# Patient Record
Sex: Female | Born: 1960 | Race: Black or African American | Hispanic: No | Marital: Single | Smoking: Never smoker
Health system: Southern US, Community
[De-identification: ages and names within clinical notes are randomized; demographics above are authoritative.]

## PROBLEM LIST (undated history)

## (undated) DIAGNOSIS — T7840XA Allergy, unspecified, initial encounter: Secondary | ICD-10-CM

## (undated) DIAGNOSIS — J302 Other seasonal allergic rhinitis: Secondary | ICD-10-CM

## (undated) DIAGNOSIS — E785 Hyperlipidemia, unspecified: Secondary | ICD-10-CM

## (undated) DIAGNOSIS — M810 Age-related osteoporosis without current pathological fracture: Secondary | ICD-10-CM

## (undated) DIAGNOSIS — F419 Anxiety disorder, unspecified: Secondary | ICD-10-CM

## (undated) DIAGNOSIS — I10 Essential (primary) hypertension: Secondary | ICD-10-CM

## (undated) DIAGNOSIS — N189 Chronic kidney disease, unspecified: Secondary | ICD-10-CM

## (undated) DIAGNOSIS — G4700 Insomnia, unspecified: Secondary | ICD-10-CM

## (undated) HISTORY — DX: Anxiety disorder, unspecified: F41.9

## (undated) HISTORY — DX: Age-related osteoporosis without current pathological fracture: M81.0

## (undated) HISTORY — DX: Essential (primary) hypertension: I10

## (undated) HISTORY — DX: Other seasonal allergic rhinitis: J30.2

## (undated) HISTORY — DX: Insomnia, unspecified: G47.00

## (undated) HISTORY — DX: Allergy, unspecified, initial encounter: T78.40XA

## (undated) HISTORY — DX: Hyperlipidemia, unspecified: E78.5

## (undated) HISTORY — DX: Chronic kidney disease, unspecified: N18.9

---

## 2007-01-27 ENCOUNTER — Other Ambulatory Visit: Admission: RE | Admit: 2007-01-27 | Discharge: 2007-01-27 | Payer: Self-pay | Admitting: Obstetrics & Gynecology

## 2008-02-06 ENCOUNTER — Other Ambulatory Visit: Admission: RE | Admit: 2008-02-06 | Discharge: 2008-02-06 | Payer: Self-pay | Admitting: Obstetrics and Gynecology

## 2009-02-10 ENCOUNTER — Other Ambulatory Visit: Admission: RE | Admit: 2009-02-10 | Discharge: 2009-02-10 | Payer: Self-pay | Admitting: Obstetrics & Gynecology

## 2010-05-08 ENCOUNTER — Encounter (INDEPENDENT_AMBULATORY_CARE_PROVIDER_SITE_OTHER): Payer: Self-pay | Admitting: *Deleted

## 2010-05-08 ENCOUNTER — Encounter: Payer: Self-pay | Admitting: Internal Medicine

## 2010-05-08 ENCOUNTER — Ambulatory Visit: Payer: Self-pay | Admitting: Internal Medicine

## 2010-05-12 ENCOUNTER — Encounter: Payer: Self-pay | Admitting: Internal Medicine

## 2010-05-14 NOTE — Letter (Signed)
Summary: Plan of Care, Need to Discuss  [facility]  [address]   [place], [place] [address]   Phone: [phone]  Fax: [phone]    May 08, 2010  BLIMY NAPOLEON [address] [place], [place]  [address] 30-Jan-1961   Dear Ms. Richman,   We are writing this letter to inform you of treatment plans and/or discuss your plan of care.  We have tried several times to contact you; however, we have yet to reach you.  We ask that you please contact our office for follow-up on your gastrointestinal issues.  We can  be reached at [phone] to schedule an appointment, or to speak with someone regarding your health care needs.  Please do not neglect your health.   Sincerely,    Rexene Alberts  [facility] Ph: [phone]    Fax: [phone]

## 2010-05-14 NOTE — Letter (Signed)
Summary: TCS ORDER  TCS ORDER   Imported By: Rexene Alberts 05/08/2010 11:29:11  _____________________________________________________________________  External Attachment:    Type:   Image     Comment:   External Document  Appended Document: TCS ORDER CANT REACH PATIENT/MAILED A LETTER

## 2010-05-20 NOTE — Letter (Signed)
Summary: TCS TRIAGE  TCS TRIAGE   Imported By: Rexene Alberts 05/08/2010 15:23:09  _____________________________________________________________________  External Attachment:    Type:   Image     Comment:   External Document  Appended Document: TCS TRIAGE ok as is  Appended Document: TCS TRIAGE INSTRUCTIONS MAILED TO PATIENT

## 2010-05-26 ENCOUNTER — Other Ambulatory Visit (HOSPITAL_COMMUNITY): Payer: Self-pay | Admitting: Pulmonary Disease

## 2010-05-26 DIAGNOSIS — Z780 Asymptomatic menopausal state: Secondary | ICD-10-CM

## 2010-05-27 ENCOUNTER — Encounter: Payer: 59 | Admitting: Internal Medicine

## 2010-05-27 ENCOUNTER — Ambulatory Visit (HOSPITAL_COMMUNITY)
Admission: RE | Admit: 2010-05-27 | Discharge: 2010-05-27 | Disposition: A | Discharge status: Home / Self Care | Payer: 59 | Source: Ambulatory Visit | Attending: Internal Medicine | Admitting: Internal Medicine

## 2010-05-27 DIAGNOSIS — Z1211 Encounter for screening for malignant neoplasm of colon: Secondary | ICD-10-CM | POA: Insufficient documentation

## 2010-05-27 DIAGNOSIS — I10 Essential (primary) hypertension: Secondary | ICD-10-CM | POA: Insufficient documentation

## 2010-06-01 ENCOUNTER — Ambulatory Visit (HOSPITAL_COMMUNITY)
Admission: RE | Admit: 2010-06-01 | Discharge: 2010-06-01 | Disposition: A | Discharge status: Home / Self Care | Payer: 59 | Source: Ambulatory Visit | Attending: Pulmonary Disease | Admitting: Pulmonary Disease

## 2010-06-01 DIAGNOSIS — M899 Disorder of bone, unspecified: Secondary | ICD-10-CM | POA: Insufficient documentation

## 2010-06-01 DIAGNOSIS — M949 Disorder of cartilage, unspecified: Secondary | ICD-10-CM | POA: Insufficient documentation

## 2010-06-01 DIAGNOSIS — Z780 Asymptomatic menopausal state: Secondary | ICD-10-CM | POA: Insufficient documentation

## 2010-06-03 NOTE — Op Note (Addendum)
  [name], Carol Maldonado              ACCOUNT NO.:  [id number]  MEDICAL RECORD NO.:  [id number]           PATIENT TYPE:  O  LOCATION:  DAYP                          FACILITY:  [facility]  PHYSICIAN:  R. Roetta Sessions, M.D. DATE OF BIRTH:  [birth year]  DATE OF PROCEDURE:  05/27/2010 DATE OF DISCHARGE:                              OPERATIVE REPORT   INDICATIONS FOR PROCEDURE:  A 50 year old lady referred for her first ever screening colonoscopy.  No lower GI tract symptoms.  No family history of polyps or colon cancer.  Colonoscopy is now being done as standard screening maneuver.  Risks, benefits, limitations, alternatives and imponderables have been discussed, questions answered.  Please see the documentation in the medical record.  PROCEDURE NOTE:  O2 saturation, blood pressure, pulse and respirations were monitored throughout the entire procedure.  CONSCIOUS SEDATION: 1. Versed 4 mg IV. 2. Demerol 100 mg IV in divided doses.  INSTRUMENT:  Pentax video chip system.  Digital rectal exam revealed no abnormalities.  Endoscopic findings: Prep was excellent.  Colon:  Colonic mucosa was surveyed from the rectosigmoid junction through the left transverse right colon to the appendiceal orifice, ileocecal valve/cecum.  These structures were well seen and photographed for the record.  From this level, scope was slowly withdrawn.  All previously mentioned mucosal surfaces were again seen. The terminal ileum was also intubated 10-cm.  The colonic mucosa as well as terminal mucosa appeared normal.  Scope was pulled down into the rectum where thorough examination of the rectal mucosa including retroflexed view of the anal verge demonstrated no abnormalities.  The patient tolerated the procedure well, was reactive to endoscopy.  Cecal withdrawal time 10 minutes.  IMPRESSION:  Normal rectum:  Terminal ileum.  RECOMMENDATIONS:  Repeat screening colonoscopy in 10 years.    Jonathon Bellows,  M.D.    RMR/MEDQ  D:  05/27/2010  T:  05/27/2010  Job:  [id number]  Electronically Signed by Lorrin Goodell M.D. on 06/02/2010 02:43:05 PM Electronically Signed by Lorrin Goodell M.D. on 06/02/2010 03:18:01 PM Electronically Signed by Lorrin Goodell M.D. on 06/02/2010 03:42:45 PM Electronically Signed by Lorrin Goodell M.D. on 06/02/2010 04:19:35 PM Electronically Signed by Lorrin Goodell M.D. on 06/02/2010 04:50:41 PM Electronically Signed by Lorrin Goodell M.D. on 06/02/2010 04:50:41 PM Electronically Signed by Lorrin Goodell M.D. on 06/02/2010 07:36:58 PM

## 2011-03-27 HISTORY — PX: COLONOSCOPY: SHX174

## 2014-02-18 ENCOUNTER — Other Ambulatory Visit (HOSPITAL_COMMUNITY)
Admission: RE | Admit: 2014-02-18 | Discharge: 2014-02-18 | Disposition: A | Discharge status: Home / Self Care | Payer: 59 | Source: Ambulatory Visit | Attending: Obstetrics & Gynecology | Admitting: Obstetrics & Gynecology

## 2014-02-18 ENCOUNTER — Encounter: Payer: Self-pay | Admitting: Obstetrics & Gynecology

## 2014-02-18 ENCOUNTER — Ambulatory Visit (INDEPENDENT_AMBULATORY_CARE_PROVIDER_SITE_OTHER): Payer: 59 | Admitting: Obstetrics & Gynecology

## 2014-02-18 VITALS — BP 130/90 | Ht 61.0 in | Wt 147.0 lb

## 2014-02-18 DIAGNOSIS — Z1151 Encounter for screening for human papillomavirus (HPV): Secondary | ICD-10-CM | POA: Diagnosis present

## 2014-02-18 DIAGNOSIS — Z01419 Encounter for gynecological examination (general) (routine) without abnormal findings: Secondary | ICD-10-CM | POA: Diagnosis present

## 2014-02-18 DIAGNOSIS — Z1212 Encounter for screening for malignant neoplasm of rectum: Secondary | ICD-10-CM

## 2014-02-18 DIAGNOSIS — Z1211 Encounter for screening for malignant neoplasm of colon: Secondary | ICD-10-CM

## 2014-02-18 NOTE — Progress Notes (Signed)
Patient ID: Carol Maldonado, female   DOB: 01/01/1961, 53 y.o.   MRN: [id number] Subjective:     Carol Maldonado is a 53 y.o. female here for a routine exam.  No LMP recorded. Patient has had an implant. No obstetric history on file. Birth Control Method:  nexplanon Menstrual Calendar(currently): amenorrheic  Current complaints: none.   Current acute medical issues:  none   Recent Gynecologic History No LMP recorded. Patient has had an implant. Last Pap: 2012,  normal Last mammogram: 2015,  Normal [facility] [place]  Past Medical History  Diagnosis Date  . Seasonal allergies     History reviewed. No pertinent past surgical history.  OB History    No data available      History   Social History  . Marital Status: Divorced    Spouse Name: N/A    Number of Children: N/A  . Years of Education: N/A   Social History Main Topics  . Smoking status: Never Smoker   . Smokeless tobacco: None  . Alcohol Use: None  . Drug Use: None  . Sexual Activity: None   Other Topics Concern  . None   Social History Narrative  . None    Family History  Problem Relation Age of Onset  . Hypertension Mother     Current outpatient prescriptions: amLODipine-olmesartan (AZOR) 5-20 MG per tablet, Take 1 tablet by mouth daily., Disp: , Rfl: ;  azelastine (ASTELIN) 0.1 % nasal spray, Place into both nostrils 2 (two) times daily. Use in each nostril as directed, Disp: , Rfl: ;  desloratadine (CLARINEX) 5 MG tablet, Take 5 mg by mouth daily., Disp: , Rfl:  flunisolide (NASALIDE) 25 MCG/ACT (0.025%) SOLN, Place 2 sprays into the nose 2 (two) times daily., Disp: , Rfl:   Review of Systems  Review of Systems  Constitutional: Negative for fever, chills, weight loss, malaise/fatigue and diaphoresis.  HENT: Negative for hearing loss, ear pain, nosebleeds, congestion, sore throat, neck pain, tinnitus and ear discharge.   Eyes: Negative for blurred vision, double vision, photophobia, pain,  discharge and redness.  Respiratory: Negative for cough, hemoptysis, sputum production, shortness of breath, wheezing and stridor.   Cardiovascular: Negative for chest pain, palpitations, orthopnea, claudication, leg swelling and PND.  Gastrointestinal: negative for abdominal pain. Negative for heartburn, nausea, vomiting, diarrhea, constipation, blood in stool and melena.  Genitourinary: Negative for dysuria, urgency, frequency, hematuria and flank pain.  Musculoskeletal: Negative for myalgias, back pain, joint pain and falls.  Skin: Negative for itching and rash.  Neurological: Negative for dizziness, tingling, tremors, sensory change, speech change, focal weakness, seizures, loss of consciousness, weakness and headaches.  Endo/Heme/Allergies: Negative for environmental allergies and polydipsia. Does not bruise/bleed easily.  Psychiatric/Behavioral: Negative for depression, suicidal ideas, hallucinations, memory loss and substance abuse. The patient is not nervous/anxious and does not have insomnia.        Objective:  Blood pressure 130/90, height 5\' 1"  (1.549 m), weight 147 lb (66.679 kg).   Physical Exam  Vitals reviewed. Constitutional: She is oriented to person, place, and time. She appears well-developed and well-nourished.  HENT:  Head: Normocephalic and atraumatic.        Right Ear: External ear normal.  Left Ear: External ear normal.  Nose: Nose normal.  Mouth/Throat: Oropharynx is clear and moist.  Eyes: Conjunctivae and EOM are normal. Pupils are equal, round, and reactive to light. Right eye exhibits no discharge. Left eye exhibits no discharge. No scleral icterus.  Neck: Normal  range of motion. Neck supple. No tracheal deviation present. No thyromegaly present.  Cardiovascular: Normal rate, regular rhythm, normal heart sounds and intact distal pulses.  Exam reveals no gallop and no friction rub.   No murmur heard. Respiratory: Effort normal and breath sounds normal. No  respiratory distress. She has no wheezes. She has no rales. She exhibits no tenderness.  GI: Soft. Bowel sounds are normal. She exhibits no distension and no mass. There is no tenderness. There is no rebound and no guarding.  Genitourinary:  Breasts no masses skin changes or nipple changes bilaterally      Vulva is normal without lesions Vagina is pink moist without discharge Cervix normal in appearance and pap is done Uterus is normal size shape and contour Adnexa is negative with normal sized ovaries  Rectal    hemoccult negative, normal tone, no masses  Musculoskeletal: Normal range of motion. She exhibits no edema and no tenderness.  Neurological: She is alert and oriented to person, place, and time. She has normal reflexes. She displays normal reflexes. No cranial nerve deficit. She exhibits normal muscle tone. Coordination normal.  Skin: Skin is warm and dry. No rash noted. No erythema. No pallor.  Psychiatric: She has a normal mood and affect. Her behavior is normal. Judgment and thought content normal.       Assessment:    Healthy female exam.    Plan:    Contraception: Nexplanon. Mammogram ordered. Follow up in: 1 year.

## 2014-02-20 LAB — CYTOLOGY - PAP

## 2015-02-25 ENCOUNTER — Ambulatory Visit (INDEPENDENT_AMBULATORY_CARE_PROVIDER_SITE_OTHER): Payer: 59 | Admitting: Obstetrics & Gynecology

## 2015-02-25 ENCOUNTER — Encounter: Payer: Self-pay | Admitting: Obstetrics & Gynecology

## 2015-02-25 VITALS — BP 110/80 | HR 74 | Ht 61.0 in | Wt 147.0 lb

## 2015-02-25 DIAGNOSIS — Z01419 Encounter for gynecological examination (general) (routine) without abnormal findings: Secondary | ICD-10-CM | POA: Diagnosis not present

## 2015-02-25 DIAGNOSIS — Z1212 Encounter for screening for malignant neoplasm of rectum: Secondary | ICD-10-CM

## 2015-02-25 DIAGNOSIS — Z1211 Encounter for screening for malignant neoplasm of colon: Secondary | ICD-10-CM

## 2015-02-25 NOTE — Progress Notes (Signed)
Patient ID: Carol HazelJuanita S Hildebrandt, female   DOB: 03-15-1961, 54 y.o.   MRN: [id number] Subjective:     Carol Maldonado is a 54 y.o. female here for a routine exam.  No LMP recorded. Patient has had an implant. No obstetric history on file. Birth Control Method:  Post menopausal Menstrual Calendar(currently):   Current complaints: .   Current acute medical issues:     Recent Gynecologic History No LMP recorded. Patient has had an implant. Last Pap: 2015,  normal Last mammogram: 2016,  normal  Past Medical History  Diagnosis Date  . Seasonal allergies     History reviewed. No pertinent past surgical history.  OB History    No data available      Social History   Social History  . Marital Status: Divorced    Spouse Name: N/A  . Number of Children: N/A  . Years of Education: N/A   Social History Main Topics  . Smoking status: Never Smoker   . Smokeless tobacco: None  . Alcohol Use: None  . Drug Use: None  . Sexual Activity: Not Asked   Other Topics Concern  . None   Social History Narrative    Family History  Problem Relation Age of Onset  . Hypertension Mother      Current outpatient prescriptions:  .  amLODipine-olmesartan (AZOR) 5-20 MG per tablet, Take 1 tablet by mouth daily., Disp: , Rfl:  .  azelastine (ASTELIN) 0.1 % nasal spray, Place into both nostrils 2 (two) times daily. Use in each nostril as directed, Disp: , Rfl:  .  desloratadine (CLARINEX) 5 MG tablet, Take 5 mg by mouth daily., Disp: , Rfl:  .  flunisolide (NASALIDE) 25 MCG/ACT (0.025%) SOLN, Place 2 sprays into the nose 2 (two) times daily., Disp: , Rfl:   Review of Systems  Review of Systems  Constitutional: Negative for fever, chills, weight loss, malaise/fatigue and diaphoresis.  HENT: Negative for hearing loss, ear pain, nosebleeds, congestion, sore throat, neck pain, tinnitus and ear discharge.   Eyes: Negative for blurred vision, double vision, photophobia, pain, discharge and redness.   Respiratory: Negative for cough, hemoptysis, sputum production, shortness of breath, wheezing and stridor.   Cardiovascular: Negative for chest pain, palpitations, orthopnea, claudication, leg swelling and PND.  Gastrointestinal: negative for abdominal pain. Negative for heartburn, nausea, vomiting, diarrhea, constipation, blood in stool and melena.  Genitourinary: Negative for dysuria, urgency, frequency, hematuria and flank pain.  Musculoskeletal: Negative for myalgias, back pain, joint pain and falls.  Skin: Negative for itching and rash.  Neurological: Negative for dizziness, tingling, tremors, sensory change, speech change, focal weakness, seizures, loss of consciousness, weakness and headaches.  Endo/Heme/Allergies: Negative for environmental allergies and polydipsia. Does not bruise/bleed easily.  Psychiatric/Behavioral: Negative for depression, suicidal ideas, hallucinations, memory loss and substance abuse. The patient is not nervous/anxious and does not have insomnia.        Objective:  Blood pressure 110/80, pulse 74, height 5\' 1"  (1.549 m), weight 147 lb (66.679 kg).   Physical Exam  Vitals reviewed. Constitutional: She is oriented to person, place, and time. She appears well-developed and well-nourished.  HENT:  Head: Normocephalic and atraumatic.        Right Ear: External ear normal.  Left Ear: External ear normal.  Nose: Nose normal.  Mouth/Throat: Oropharynx is clear and moist.  Eyes: Conjunctivae and EOM are normal. Pupils are equal, round, and reactive to light. Right eye exhibits no discharge. Left eye exhibits no discharge. No  scleral icterus.  Neck: Normal range of motion. Neck supple. No tracheal deviation present. No thyromegaly present.  Cardiovascular: Normal rate, regular rhythm, normal heart sounds and intact distal pulses.  Exam reveals no gallop and no friction rub.   No murmur heard. Respiratory: Effort normal and breath sounds normal. No respiratory  distress. She has no wheezes. She has no rales. She exhibits no tenderness.  GI: Soft. Bowel sounds are normal. She exhibits no distension and no mass. There is no tenderness. There is no rebound and no guarding.  Genitourinary:  Breasts no masses skin changes or nipple changes bilaterally      Vulva is normal without lesions Vagina is pink moist without discharge Cervix normal in appearance and pap is done Uterus is normal size shape and contour Adnexa is negative with normal sized ovaries  {Rectal    hemoccult positive, normal tone, no masses  Musculoskeletal: Normal range of motion. She exhibits no edema and no tenderness.  Neurological: She is alert and oriented to person, place, and time. She has normal reflexes. She displays normal reflexes. No cranial nerve deficit. She exhibits normal muscle tone. Coordination normal.  Skin: Skin is warm and dry. No rash noted. No erythema. No pallor.  Psychiatric: She has a normal mood and affect. Her behavior is normal. Judgment and thought content normal.       Assessment:    Healthy female exam.    Plan:    Mammogram ordered. Follow up in: 1 year.   + hemoccult x 2  Cards sent home

## 2016-01-28 ENCOUNTER — Other Ambulatory Visit (HOSPITAL_COMMUNITY): Payer: Self-pay | Admitting: Pulmonary Disease

## 2016-01-28 DIAGNOSIS — Z780 Asymptomatic menopausal state: Secondary | ICD-10-CM

## 2016-02-04 ENCOUNTER — Other Ambulatory Visit (HOSPITAL_COMMUNITY): Payer: Self-pay | Admitting: Pulmonary Disease

## 2016-02-04 ENCOUNTER — Ambulatory Visit (HOSPITAL_COMMUNITY)
Admission: RE | Admit: 2016-02-04 | Discharge: 2016-02-04 | Disposition: A | Discharge status: Home / Self Care | Payer: 59 | Source: Ambulatory Visit | Attending: Pulmonary Disease | Admitting: Pulmonary Disease

## 2016-02-04 DIAGNOSIS — M7732 Calcaneal spur, left foot: Secondary | ICD-10-CM | POA: Insufficient documentation

## 2016-02-04 DIAGNOSIS — M79672 Pain in left foot: Secondary | ICD-10-CM | POA: Diagnosis not present

## 2016-02-04 DIAGNOSIS — M810 Age-related osteoporosis without current pathological fracture: Secondary | ICD-10-CM | POA: Diagnosis not present

## 2016-02-04 DIAGNOSIS — Z780 Asymptomatic menopausal state: Secondary | ICD-10-CM | POA: Diagnosis not present

## 2016-03-04 ENCOUNTER — Other Ambulatory Visit (HOSPITAL_COMMUNITY)
Admission: RE | Admit: 2016-03-04 | Discharge: 2016-03-04 | Disposition: A | Discharge status: Home / Self Care | Payer: 59 | Source: Ambulatory Visit | Attending: Obstetrics & Gynecology | Admitting: Obstetrics & Gynecology

## 2016-03-04 ENCOUNTER — Encounter: Payer: Self-pay | Admitting: Obstetrics & Gynecology

## 2016-03-04 ENCOUNTER — Ambulatory Visit (INDEPENDENT_AMBULATORY_CARE_PROVIDER_SITE_OTHER): Payer: 59 | Admitting: Obstetrics & Gynecology

## 2016-03-04 VITALS — BP 148/100 | HR 78 | Ht 61.0 in | Wt 132.0 lb

## 2016-03-04 DIAGNOSIS — Z01419 Encounter for gynecological examination (general) (routine) without abnormal findings: Secondary | ICD-10-CM

## 2016-03-04 DIAGNOSIS — Z1212 Encounter for screening for malignant neoplasm of rectum: Secondary | ICD-10-CM

## 2016-03-04 DIAGNOSIS — Z1211 Encounter for screening for malignant neoplasm of colon: Secondary | ICD-10-CM

## 2016-03-04 NOTE — Progress Notes (Signed)
Subjective:     Carol Maldonado is a 55 y.o. female here for a routine exam.  No LMP recorded. Patient has had an implant. No obstetric history on file. Birth Control Method:  amenorrhea Menstrual Calendar(currently): amenorrheic  Current complaints: none.   Current acute medical issues:  none   Recent Gynecologic History No LMP recorded. Patient has had an implant. Last Pap: 2015,  normal Last mammogram: 2016,  normal  Past Medical History:  Diagnosis Date  . Seasonal allergies     History reviewed. No pertinent surgical history.  OB History    No data available      Social History   Social History  . Marital status: Divorced    Spouse name: N/A  . Number of children: N/A  . Years of education: N/A   Social History Main Topics  . Smoking status: Never Smoker  . Smokeless tobacco: Never Used  . Alcohol use None  . Drug use: Unknown  . Sexual activity: Not Asked   Other Topics Concern  . None   Social History Narrative  . None    Family History  Problem Relation Age of Onset  . Hypertension Mother      Current Outpatient Prescriptions:  .  amLODipine-olmesartan (AZOR) 5-20 MG per tablet, Take 1 tablet by mouth daily., Disp: , Rfl:  .  azelastine (ASTELIN) 0.1 % nasal spray, Place into both nostrils 2 (two) times daily. Use in each nostril as directed, Disp: , Rfl:  .  desloratadine (CLARINEX) 5 MG tablet, Take 5 mg by mouth daily., Disp: , Rfl:  .  flunisolide (NASALIDE) 25 MCG/ACT (0.025%) SOLN, Place 2 sprays into the nose 2 (two) times daily., Disp: , Rfl:   Review of Systems  Review of Systems  Constitutional: Negative for fever, chills, weight loss, malaise/fatigue and diaphoresis.  HENT: Negative for hearing loss, ear pain, nosebleeds, congestion, sore throat, neck pain, tinnitus and ear discharge.   Eyes: Negative for blurred vision, double vision, photophobia, pain, discharge and redness.  Respiratory: Negative for cough, hemoptysis, sputum  production, shortness of breath, wheezing and stridor.   Cardiovascular: Negative for chest pain, palpitations, orthopnea, claudication, leg swelling and PND.  Gastrointestinal: negative for abdominal pain. Negative for heartburn, nausea, vomiting, diarrhea, constipation, blood in stool and melena.  Genitourinary: Negative for dysuria, urgency, frequency, hematuria and flank pain.  Musculoskeletal: Negative for myalgias, back pain, joint pain and falls.  Skin: Negative for itching and rash.  Neurological: Negative for dizziness, tingling, tremors, sensory change, speech change, focal weakness, seizures, loss of consciousness, weakness and headaches.  Endo/Heme/Allergies: Negative for environmental allergies and polydipsia. Does not bruise/bleed easily.  Psychiatric/Behavioral: Negative for depression, suicidal ideas, hallucinations, memory loss and substance abuse. The patient is not nervous/anxious and does not have insomnia.        Objective:  Blood pressure (!) 148/100, pulse 78, height 5\' 1"  (1.549 m), weight 132 lb (59.9 kg).   Physical Exam  Vitals reviewed. Constitutional: She is oriented to person, place, and time. She appears well-developed and well-nourished.  HENT:  Head: Normocephalic and atraumatic.        Right Ear: External ear normal.  Left Ear: External ear normal.  Nose: Nose normal.  Mouth/Throat: Oropharynx is clear and moist.  Eyes: Conjunctivae and EOM are normal. Pupils are equal, round, and reactive to light. Right eye exhibits no discharge. Left eye exhibits no discharge. No scleral icterus.  Neck: Normal range of motion. Neck supple. No tracheal deviation present.  No thyromegaly present.  Cardiovascular: Normal rate, regular rhythm, normal heart sounds and intact distal pulses.  Exam reveals no gallop and no friction rub.   No murmur heard. Respiratory: Effort normal and breath sounds normal. No respiratory distress. She has no wheezes. She has no rales. She  exhibits no tenderness.  GI: Soft. Bowel sounds are normal. She exhibits no distension and no mass. There is no tenderness. There is no rebound and no guarding.  Genitourinary:  Breasts no masses skin changes or nipple changes bilaterally      Vulva is normal without lesions Vagina is pink moist without discharge Cervix normal in appearance and pap is done Uterus is normal size shape and contour Adnexa is negative with normal sized ovaries  {Rectal    hemoccult negative, normal tone, no masses  Musculoskeletal: Normal range of motion. She exhibits no edema and no tenderness.  Neurological: She is alert and oriented to person, place, and time. She has normal reflexes. She displays normal reflexes. No cranial nerve deficit. She exhibits normal muscle tone. Coordination normal.  Skin: Skin is warm and dry. No rash noted. No erythema. No pallor.  Psychiatric: She has a normal mood and affect. Her behavior is normal. Judgment and thought content normal.       Medications Ordered at today's visit: No orders of the defined types were placed in this encounter.   Other orders placed at today's visit: No orders of the defined types were placed in this encounter.     Assessment:    Healthy female exam.    Plan:    Mammogram ordered. Follow up in: 2 years.     No Follow-up on file.

## 2016-03-09 LAB — CYTOLOGY - PAP: DIAGNOSIS: NEGATIVE

## 2016-03-18 ENCOUNTER — Encounter: Payer: Self-pay | Admitting: Obstetrics & Gynecology

## 2016-03-18 ENCOUNTER — Ambulatory Visit (INDEPENDENT_AMBULATORY_CARE_PROVIDER_SITE_OTHER): Payer: 59 | Admitting: Obstetrics & Gynecology

## 2016-03-18 VITALS — BP 130/80 | HR 80 | Wt 133.0 lb

## 2016-03-18 DIAGNOSIS — Z3049 Encounter for surveillance of other contraceptives: Secondary | ICD-10-CM | POA: Diagnosis not present

## 2016-03-18 DIAGNOSIS — Z3046 Encounter for surveillance of implantable subdermal contraceptive: Secondary | ICD-10-CM

## 2016-03-18 NOTE — Progress Notes (Signed)
Nexplanon removal Note:    Pt presents desiring removal of Nexplanon She has been counseled regarding various methods including OCP, nuva ring, levonorgestrel IUD, Depo Provera injections and Nexplanon.    The left arm is inspected and is appropriate for removal. The area is prepped with betadine. 3 cc 1% lidocaine is injected at the proposed injection site.  A small stab incision is made with a #11 blade. The Nexplanon device is isolated andremoved from  the previously anesthetized area. It is removed without difficulty.  There is minimal bleeding.  The stab incision is reapproximated with 3 steri strips. A wrap gauze dressing is placed which patient will leave in place for 2 days or so.

## 2016-05-03 DIAGNOSIS — Z1231 Encounter for screening mammogram for malignant neoplasm of breast: Secondary | ICD-10-CM | POA: Diagnosis not present

## 2016-05-12 DIAGNOSIS — R928 Other abnormal and inconclusive findings on diagnostic imaging of breast: Secondary | ICD-10-CM | POA: Diagnosis not present

## 2016-05-12 DIAGNOSIS — N6489 Other specified disorders of breast: Secondary | ICD-10-CM | POA: Diagnosis not present

## 2016-07-29 DIAGNOSIS — M810 Age-related osteoporosis without current pathological fracture: Secondary | ICD-10-CM | POA: Diagnosis not present

## 2016-07-29 DIAGNOSIS — I10 Essential (primary) hypertension: Secondary | ICD-10-CM | POA: Diagnosis not present

## 2016-07-29 DIAGNOSIS — J309 Allergic rhinitis, unspecified: Secondary | ICD-10-CM | POA: Diagnosis not present

## 2017-01-31 DIAGNOSIS — Z0000 Encounter for general adult medical examination without abnormal findings: Secondary | ICD-10-CM | POA: Diagnosis not present

## 2017-05-31 DIAGNOSIS — Z1231 Encounter for screening mammogram for malignant neoplasm of breast: Secondary | ICD-10-CM | POA: Diagnosis not present

## 2017-06-14 ENCOUNTER — Other Ambulatory Visit (HOSPITAL_COMMUNITY): Payer: Self-pay | Admitting: Pulmonary Disease

## 2017-06-14 ENCOUNTER — Ambulatory Visit (HOSPITAL_COMMUNITY)
Admission: RE | Admit: 2017-06-14 | Discharge: 2017-06-14 | Disposition: A | Discharge status: Home / Self Care | Payer: 59 | Source: Ambulatory Visit | Attending: Pulmonary Disease | Admitting: Pulmonary Disease

## 2017-06-14 DIAGNOSIS — M2578 Osteophyte, vertebrae: Secondary | ICD-10-CM | POA: Diagnosis not present

## 2017-06-14 DIAGNOSIS — I10 Essential (primary) hypertension: Secondary | ICD-10-CM | POA: Diagnosis not present

## 2017-06-14 DIAGNOSIS — R52 Pain, unspecified: Secondary | ICD-10-CM

## 2017-06-14 DIAGNOSIS — M545 Low back pain: Secondary | ICD-10-CM | POA: Insufficient documentation

## 2017-06-14 DIAGNOSIS — M5136 Other intervertebral disc degeneration, lumbar region: Secondary | ICD-10-CM | POA: Diagnosis not present

## 2017-06-14 DIAGNOSIS — M5432 Sciatica, left side: Secondary | ICD-10-CM | POA: Diagnosis not present

## 2017-08-03 DIAGNOSIS — I10 Essential (primary) hypertension: Secondary | ICD-10-CM | POA: Diagnosis not present

## 2017-08-03 DIAGNOSIS — M5432 Sciatica, left side: Secondary | ICD-10-CM | POA: Diagnosis not present

## 2018-02-06 DIAGNOSIS — Z0000 Encounter for general adult medical examination without abnormal findings: Secondary | ICD-10-CM | POA: Diagnosis not present

## 2018-03-08 DIAGNOSIS — H524 Presbyopia: Secondary | ICD-10-CM | POA: Diagnosis not present

## 2018-03-08 DIAGNOSIS — H5213 Myopia, bilateral: Secondary | ICD-10-CM | POA: Diagnosis not present

## 2018-03-09 ENCOUNTER — Encounter: Payer: Self-pay | Admitting: Adult Health

## 2018-03-09 ENCOUNTER — Ambulatory Visit (INDEPENDENT_AMBULATORY_CARE_PROVIDER_SITE_OTHER): Payer: 59 | Admitting: Adult Health

## 2018-03-09 VITALS — BP 140/92 | HR 84 | Ht 61.0 in | Wt 151.5 lb

## 2018-03-09 DIAGNOSIS — Z01419 Encounter for gynecological examination (general) (routine) without abnormal findings: Secondary | ICD-10-CM | POA: Diagnosis not present

## 2018-03-09 DIAGNOSIS — Z1212 Encounter for screening for malignant neoplasm of rectum: Secondary | ICD-10-CM | POA: Diagnosis not present

## 2018-03-09 DIAGNOSIS — Z1211 Encounter for screening for malignant neoplasm of colon: Secondary | ICD-10-CM

## 2018-03-09 LAB — HEMOCCULT GUIAC POC 1CARD (OFFICE): FECAL OCCULT BLD: NEGATIVE

## 2018-03-09 NOTE — Progress Notes (Signed)
Patient ID: Carol Maldonado, female   DOB: February 19, 1961, 57 y.o.   MRN: [id number] History of Present Illness: Carol Maldonado is a 57 year old black female, single, PM in for well woman gyn exam,she had a normal pap 03/04/16.She is still working at [employer]. Just saw Dr Juanetta GoslingHawkins last month and BP normal, but work is stressful.  PCP is Dr Juanetta GoslingHawkins.   Current Medications, Allergies, Past Medical History, Past Surgical History, Family History and Social History were reviewed in [facility] electronic medical record.     Review of Systems:  Patient denies any daily headaches(has one today), hearing loss, fatigue, blurred vision, shortness of breath, chest pain, abdominal pain, problems with bowel movements, urination, or intercourse(not having sex). No joint pain or mood swings.   Physical Exam:BP (!) 140/92 (BP Location: Left Arm, Cuff Size: Normal)   Pulse 84   Ht 5\' 1"  (1.549 m)   Wt 151 lb 8 oz (68.7 kg)   BMI 28.63 kg/m  General:  Well developed, well nourished, no acute distress Skin:  Warm and dry Neck:  Midline trachea, normal thyroid, good ROM, no lymphadenopathy Lungs; Clear to auscultation bilaterally Breast:  No dominant palpable mass, retraction, or nipple discharge Cardiovascular: Regular rate and rhythm Abdomen:  Soft, non tender, no hepatosplenomegaly Pelvic:  External genitalia is normal in appearance, no lesions.  The vagina is normal in appearance for age,pale with loss of moisture and rugae. Urethra has no lesions or masses. The cervix is smooth.  Uterus is felt to be normal size, shape, and contour.  No adnexal masses or tenderness noted.Bladder is non tender, no masses felt. Rectal: Good sphincter tone, no polyps, or hemorrhoids felt.  Hemoccult negative. Extremities/musculoskeletal:  No swelling or varicosities noted, no clubbing or cyanosis Psych:  No mood changes, alert and cooperative,seems happy PHQ 2 score 0. Fall risk is low. Examination chaperoned by Malachy Moodjanet Young  LPN.  Impression: 1. Encounter for well woman exam with routine gynecological exam   2. Screening for colorectal cancer       Plan: Get plant nurse to recheck BP tomorrow Pap and physical in 1 year Mammogram yearly Labs with PCP Colonoscopy per GI

## 2018-06-08 DIAGNOSIS — Z1231 Encounter for screening mammogram for malignant neoplasm of breast: Secondary | ICD-10-CM | POA: Diagnosis not present

## 2018-08-07 DIAGNOSIS — I10 Essential (primary) hypertension: Secondary | ICD-10-CM | POA: Diagnosis not present

## 2018-08-07 DIAGNOSIS — J301 Allergic rhinitis due to pollen: Secondary | ICD-10-CM | POA: Diagnosis not present

## 2018-08-07 DIAGNOSIS — K210 Gastro-esophageal reflux disease with esophagitis: Secondary | ICD-10-CM | POA: Diagnosis not present

## 2018-11-07 ENCOUNTER — Ambulatory Visit (HOSPITAL_COMMUNITY)
Admission: RE | Admit: 2018-11-07 | Discharge: 2018-11-07 | Disposition: A | Discharge status: Home / Self Care | Payer: 59 | Source: Ambulatory Visit | Attending: Pulmonary Disease | Admitting: Pulmonary Disease

## 2018-11-07 ENCOUNTER — Other Ambulatory Visit: Payer: Self-pay

## 2018-11-07 ENCOUNTER — Other Ambulatory Visit (HOSPITAL_COMMUNITY): Payer: Self-pay | Admitting: Pulmonary Disease

## 2018-11-07 DIAGNOSIS — M545 Low back pain, unspecified: Secondary | ICD-10-CM

## 2018-11-20 ENCOUNTER — Other Ambulatory Visit: Payer: Self-pay | Admitting: Pulmonary Disease

## 2018-11-20 ENCOUNTER — Other Ambulatory Visit (HOSPITAL_COMMUNITY): Payer: Self-pay | Admitting: Pulmonary Disease

## 2018-11-20 DIAGNOSIS — M545 Low back pain, unspecified: Secondary | ICD-10-CM

## 2018-11-28 ENCOUNTER — Other Ambulatory Visit: Payer: Self-pay

## 2018-11-28 ENCOUNTER — Ambulatory Visit (HOSPITAL_COMMUNITY)
Admission: RE | Admit: 2018-11-28 | Discharge: 2018-11-28 | Disposition: A | Discharge status: Home / Self Care | Payer: 59 | Source: Ambulatory Visit | Attending: Pulmonary Disease | Admitting: Pulmonary Disease

## 2018-11-28 DIAGNOSIS — M545 Low back pain, unspecified: Secondary | ICD-10-CM

## 2019-05-17 ENCOUNTER — Ambulatory Visit: Payer: 59

## 2019-05-21 ENCOUNTER — Ambulatory Visit: Payer: 59 | Attending: Family

## 2019-05-21 DIAGNOSIS — Z23 Encounter for immunization: Secondary | ICD-10-CM | POA: Insufficient documentation

## 2019-05-21 NOTE — Progress Notes (Signed)
   Covid-19 Vaccination Clinic  Name:  Carol Maldonado    MRN: [id number] DOB: 02-25-1961  05/21/2019  [name] was observed post Covid-19 immunization for 15 minutes without incidence. She was provided with Vaccine Information Sheet and instruction to access the V-Safe system.   [name] was instructed to call 911 with any severe reactions post vaccine: Marland Kitchen Difficulty breathing  . Swelling of your face and throat  . A fast heartbeat  . A bad rash all over your body  . Dizziness and weakness    Immunizations Administered    Name Date Dose VIS Date Route   Moderna COVID-19 Vaccine 05/21/2019  4:54 PM 0.5 mL 02/27/2019 Intramuscular   Manufacturer: Moderna   Lot: [id number]   NDC: 93112-162-44

## 2019-07-03 ENCOUNTER — Ambulatory Visit: Payer: 59 | Attending: Family

## 2019-07-03 DIAGNOSIS — Z23 Encounter for immunization: Secondary | ICD-10-CM

## 2019-07-03 NOTE — Progress Notes (Signed)
   Covid-19 Vaccination Clinic  Name:  Carol Maldonado    MRN: [id number] DOB: 20-Jan-1961  07/03/2019  [name] was observed post Covid-19 immunization for 15 minutes without incident. She was provided with Vaccine Information Sheet and instruction to access the V-Safe system.   [name] was instructed to call 911 with any severe reactions post vaccine: Marland Kitchen Difficulty breathing  . Swelling of face and throat  . A fast heartbeat  . A bad rash all over body  . Dizziness and weakness   Immunizations Administered    Name Date Dose VIS Date Route   Moderna COVID-19 Vaccine 07/03/2019 11:51 AM 0.5 mL 02/27/2019 Intramuscular   Manufacturer: Moderna   Lot: [id number]   NDC: 28366-294-76

## 2019-10-25 ENCOUNTER — Other Ambulatory Visit (HOSPITAL_COMMUNITY): Payer: Self-pay | Admitting: Internal Medicine

## 2019-10-25 DIAGNOSIS — Z13820 Encounter for screening for osteoporosis: Secondary | ICD-10-CM

## 2020-02-13 ENCOUNTER — Other Ambulatory Visit: Payer: Self-pay

## 2020-02-13 DIAGNOSIS — Z13220 Encounter for screening for lipoid disorders: Secondary | ICD-10-CM

## 2020-02-13 DIAGNOSIS — E559 Vitamin D deficiency, unspecified: Secondary | ICD-10-CM

## 2020-02-13 DIAGNOSIS — E1159 Type 2 diabetes mellitus with other circulatory complications: Secondary | ICD-10-CM

## 2020-02-13 DIAGNOSIS — E1165 Type 2 diabetes mellitus with hyperglycemia: Secondary | ICD-10-CM

## 2020-02-13 DIAGNOSIS — I152 Hypertension secondary to endocrine disorders: Secondary | ICD-10-CM

## 2020-03-01 LAB — CBC WITH DIFFERENTIAL/PLATELET
Basophils Absolute: 0 10*3/uL (ref 0.0–0.2)
Basos: 0 %
EOS (ABSOLUTE): 0 10*3/uL (ref 0.0–0.4)
Eos: 1 %
Hematocrit: 33.5 % — ABNORMAL LOW (ref 34.0–46.6)
Hemoglobin: 11.1 g/dL (ref 11.1–15.9)
Immature Grans (Abs): 0 10*3/uL (ref 0.0–0.1)
Immature Granulocytes: 0 %
Lymphocytes Absolute: 1.3 10*3/uL (ref 0.7–3.1)
Lymphs: 19 %
MCH: 27.5 pg (ref 26.6–33.0)
MCHC: 33.1 g/dL (ref 31.5–35.7)
MCV: 83 fL (ref 79–97)
Monocytes Absolute: 0.5 10*3/uL (ref 0.1–0.9)
Monocytes: 7 %
Neutrophils Absolute: 5.1 10*3/uL (ref 1.4–7.0)
Neutrophils: 73 %
Platelets: 203 10*3/uL (ref 150–450)
RBC: 4.03 x10E6/uL (ref 3.77–5.28)
RDW: 13.5 % (ref 11.7–15.4)
WBC: 6.9 10*3/uL (ref 3.4–10.8)

## 2020-03-01 LAB — CMP14+EGFR
ALT: 10 IU/L (ref 0–32)
AST: 16 IU/L (ref 0–40)
Albumin/Globulin Ratio: 1.8 (ref 1.2–2.2)
Albumin: 4.9 g/dL — ABNORMAL HIGH (ref 3.8–4.8)
Alkaline Phosphatase: 66 IU/L (ref 44–121)
BUN/Creatinine Ratio: 11 — ABNORMAL LOW (ref 12–28)
BUN: 14 mg/dL (ref 8–27)
Bilirubin Total: 0.3 mg/dL (ref 0.0–1.2)
CO2: 24 mmol/L (ref 20–29)
Calcium: 9.8 mg/dL (ref 8.7–10.3)
Chloride: 102 mmol/L (ref 96–106)
Creatinine, Ser: 1.25 mg/dL — ABNORMAL HIGH (ref 0.57–1.00)
GFR calc Af Amer: 54 mL/min/{1.73_m2} — ABNORMAL LOW (ref >59)
GFR calc non Af Amer: 47 mL/min/{1.73_m2} — ABNORMAL LOW (ref >59)
Globulin, Total: 2.8 g/dL (ref 1.5–4.5)
Glucose: 91 mg/dL (ref 65–99)
Potassium: 5 mmol/L (ref 3.5–5.2)
Sodium: 139 mmol/L (ref 134–144)
Total Protein: 7.7 g/dL (ref 6.0–8.5)

## 2020-03-01 LAB — LIPID PANEL
Chol/HDL Ratio: 1.8 ratio (ref 0.0–4.4)
Cholesterol, Total: 131 mg/dL (ref 100–199)
HDL: 71 mg/dL (ref >39)
LDL Chol Calc (NIH): 48 mg/dL (ref 0–99)
Triglycerides: 55 mg/dL (ref 0–149)
VLDL Cholesterol Cal: 12 mg/dL (ref 5–40)

## 2020-03-01 LAB — HEMOGLOBIN A1C
Est. average glucose Bld gHb Est-mCnc: 120 mg/dL
Hgb A1c MFr Bld: 5.8 % — ABNORMAL HIGH (ref 4.8–5.6)

## 2020-03-01 LAB — MICROALBUMIN, URINE: Microalbumin, Urine: 16.7 ug/mL

## 2020-03-01 LAB — VITAMIN D 25 HYDROXY (VIT D DEFICIENCY, FRACTURES): Vit D, 25-Hydroxy: 151.2 ng/mL — ABNORMAL HIGH (ref 30.0–100.0)

## 2020-03-03 ENCOUNTER — Encounter: Payer: Self-pay | Admitting: Nurse Practitioner

## 2020-03-03 ENCOUNTER — Ambulatory Visit (INDEPENDENT_AMBULATORY_CARE_PROVIDER_SITE_OTHER): Payer: 59 | Admitting: Nurse Practitioner

## 2020-03-03 ENCOUNTER — Other Ambulatory Visit: Payer: Self-pay

## 2020-03-03 VITALS — BP 153/93 | HR 78 | Temp 98.9°F | Resp 20 | Ht 61.0 in | Wt 131.0 lb

## 2020-03-03 DIAGNOSIS — Z7689 Persons encountering health services in other specified circumstances: Secondary | ICD-10-CM

## 2020-03-03 DIAGNOSIS — J302 Other seasonal allergic rhinitis: Secondary | ICD-10-CM

## 2020-03-03 DIAGNOSIS — Z0000 Encounter for general adult medical examination without abnormal findings: Secondary | ICD-10-CM | POA: Diagnosis not present

## 2020-03-03 DIAGNOSIS — G4700 Insomnia, unspecified: Secondary | ICD-10-CM | POA: Diagnosis not present

## 2020-03-03 DIAGNOSIS — I10 Essential (primary) hypertension: Secondary | ICD-10-CM

## 2020-03-03 NOTE — Assessment & Plan Note (Signed)
-  obtain records from Dr. Scharlene Gloss

## 2020-03-03 NOTE — Assessment & Plan Note (Signed)
-  BP slightly elevated today 153/93 -taking amlodipine-olmesartan 5/20 mg daily

## 2020-03-03 NOTE — Assessment & Plan Note (Addendum)
-  no issues today -takes azelastine nose spray BID PRN and desloratadine 5 mg daily PRN -also takes flunisolide 2 sprays BID and benadryl PRN

## 2020-03-03 NOTE — Patient Instructions (Signed)
It is great to see you again today.  Your labs were great.  We won't need to draw those again for a year.  We will meet back up in 3 months for a medication check for your xanax.

## 2020-03-03 NOTE — Progress Notes (Signed)
New Patient Office Visit  Subjective:  Patient ID: Carol Maldonado, female    DOB: [birth year]  Age: 59 y.o. MRN: [id number]  CC:  Chief Complaint  Patient presents with  . New Patient (Initial Visit)    HPI Carol Maldonado presents for new patient visit. Transferring care from Dr. Scharlene Gloss. Last labs on 02/28/20 and physical is due today.  Past Medical History:  Diagnosis Date  . Allergy    Phreesia 02/29/2020  . Anxiety    Phreesia 02/29/2020  . Hyperlipidemia    Phreesia 02/29/2020  . Hypertension    Phreesia 02/29/2020  . Osteoporosis    Phreesia 02/29/2020  . Seasonal allergies     History reviewed. No pertinent surgical history.  Family History  Problem Relation Age of Onset  . Hypertension Mother   . Heart attack Mother   . Pneumonia Maternal Grandmother   . Other Maternal Grandfather        MVA  . Hypertension Father   . High Cholesterol Father   . Irritable bowel syndrome Sister   . Irritable bowel syndrome Sister     Social History   Socioeconomic History  . Marital status: Single    Spouse name: Not on file  . Number of children: Not on file  . Years of education: Not on file  . Highest education level: Not on file  Occupational History  . Not on file  Tobacco Use  . Smoking status: Never Smoker  . Smokeless tobacco: Never Used  Vaping Use  . Vaping Use: Never used  Substance and Sexual Activity  . Alcohol use: Never    Alcohol/week: 0.0 standard drinks  . Drug use: Never  . Sexual activity: Not Currently    Birth control/protection: Post-menopausal  Other Topics Concern  . Not on file  Social History Narrative  . Not on file   Social Determinants of Health   Financial Resource Strain:   . Difficulty of Paying Living Expenses: Not on file  Food Insecurity:   . Worried About Programme researcher, broadcasting/film/video in the Last Year: Not on file  . Ran Out of Food in the Last Year: Not on file  Transportation Needs:   . Lack of Transportation  (Medical): Not on file  . Lack of Transportation (Non-Medical): Not on file  Physical Activity:   . Days of Exercise per Week: Not on file  . Minutes of Exercise per Session: Not on file  Stress:   . Feeling of Stress : Not on file  Social Connections:   . Frequency of Communication with Friends and Family: Not on file  . Frequency of Social Gatherings with Friends and Family: Not on file  . Attends Religious Services: Not on file  . Active Member of Clubs or Organizations: Not on file  . Attends Banker Meetings: Not on file  . Marital Status: Not on file  Intimate Partner Violence:   . Fear of Current or Ex-Partner: Not on file  . Emotionally Abused: Not on file  . Physically Abused: Not on file  . Sexually Abused: Not on file    ROS Review of Systems  Constitutional: Negative.   HENT: Negative.   Eyes: Negative.   Respiratory: Negative.   Cardiovascular: Negative.   Gastrointestinal: Negative.   Endocrine: Negative.   Genitourinary: Negative.   Musculoskeletal: Negative.   Allergic/Immunologic: Negative.   Neurological: Negative.   Hematological: Negative.   Psychiatric/Behavioral: Negative.     Objective:  Today's Vitals: BP (!) 153/93   Pulse 78   Temp 98.9 F (37.2 C)   Resp 20   Ht 5\' 1"  (1.549 m)   Wt 131 lb (59.4 kg)   SpO2 97%   BMI 24.75 kg/m   Physical Exam Constitutional:      Appearance: Normal appearance.  HENT:     Head: Normocephalic and atraumatic.     Right Ear: Tympanic membrane, ear canal and external ear normal.     Left Ear: Tympanic membrane, ear canal and external ear normal.     Nose: Nose normal.     Mouth/Throat:     Mouth: Mucous membranes are moist.     Pharynx: Oropharynx is clear.  Eyes:     Extraocular Movements: Extraocular movements intact.     Conjunctiva/sclera: Conjunctivae normal.     Pupils: Pupils are equal, round, and reactive to light.  Cardiovascular:     Rate and Rhythm: Normal rate and  regular rhythm.     Pulses: Normal pulses.     Heart sounds: Normal heart sounds.  Pulmonary:     Effort: Pulmonary effort is normal.     Breath sounds: Normal breath sounds.  Abdominal:     General: Abdomen is flat. Bowel sounds are normal.     Palpations: Abdomen is soft.  Musculoskeletal:        General: Normal range of motion.     Cervical back: Normal range of motion and neck supple.  Skin:    General: Skin is warm and dry.     Capillary Refill: Capillary refill takes less than 2 seconds.  Neurological:     General: No focal deficit present.     Mental Status: She is alert and oriented to person, place, and time.  Psychiatric:        Mood and Affect: Mood normal.        Behavior: Behavior normal.        Thought Content: Thought content normal.        Judgment: Judgment normal.     Assessment & Plan:   Problem List Items Addressed This Visit      Cardiovascular and Mediastinum   Essential hypertension, benign    -BP slightly elevated today 153/93 -taking amlodipine-olmesartan 5/20 mg daily        Respiratory   Seasonal allergic rhinitis    -no issues today -takes azelastine nose spray BID PRN and desloratadine 5 mg daily PRN -also takes flunisolide 2 sprays BID and benadryl PRN        Other   Insomnia, unspecified    -no issues today -takes xanax 0.5 mg at bedtime      Encounter to establish care    -obtain records from Dr.       Other Visit Diagnoses    Wellness examination    -  Primary      Outpatient Encounter Medications as of 03/03/2020  Medication Sig  . albuterol (PROAIR HFA) 108 (90 Base) MCG/ACT inhaler Inhale 2 puffs into the lungs as needed.   . ALPRAZolam (XANAX) 0.5 MG tablet Take 0.5 mg by mouth at bedtime.   14/08/2019 amLODipine-olmesartan (AZOR) 5-20 MG per tablet Take 1 tablet by mouth daily.  Marland Kitchen azelastine (ASTELIN) 0.1 % nasal spray Place into both nostrils 2 (two) times daily. Use in each nostril as directed  . desloratadine  (CLARINEX) 5 MG tablet Take 5 mg by mouth daily.  . diphenhydrAMINE HCl (BENADRYL PO) Take by mouth  as needed.  . flunisolide (NASALIDE) 25 MCG/ACT (0.025%) SOLN Place 2 sprays into the nose 2 (two) times daily.  Marland Kitchen ibuprofen (ADVIL,MOTRIN) 200 MG tablet Take 400 mg by mouth as needed.  . sertraline (ZOLOFT) 50 MG tablet Take 50 mg by mouth at bedtime.    No facility-administered encounter medications on file as of 03/03/2020.    Follow-up: Return in about 3 months (around 06/01/2020) for ; 1 year for physical (will need HCV and HIC screening at that time).   Heather Roberts, NP

## 2020-03-03 NOTE — Assessment & Plan Note (Signed)
-  no issues today -takes xanax 0.5 mg at bedtime

## 2020-04-07 ENCOUNTER — Telehealth: Payer: Self-pay

## 2020-04-07 ENCOUNTER — Other Ambulatory Visit: Payer: Self-pay

## 2020-04-07 DIAGNOSIS — J302 Other seasonal allergic rhinitis: Secondary | ICD-10-CM

## 2020-04-07 DIAGNOSIS — I10 Essential (primary) hypertension: Secondary | ICD-10-CM

## 2020-04-07 DIAGNOSIS — G4700 Insomnia, unspecified: Secondary | ICD-10-CM

## 2020-04-07 MED ORDER — AMLODIPINE-OLMESARTAN 5-20 MG PO TABS: 1.0000 | ORAL_TABLET | Freq: Every day | ORAL | 1 refills | Status: DC

## 2020-04-07 MED ORDER — AZELASTINE HCL 0.1 % NA SOLN: 1.0000 | Freq: Two times a day (BID) | NASAL | 1 refills | Status: DC

## 2020-04-07 MED ORDER — DESLORATADINE 5 MG PO TABS: 5.0000 mg | ORAL_TABLET | Freq: Every day | ORAL | 1 refills | Status: DC

## 2020-04-07 MED ORDER — SERTRALINE HCL 50 MG PO TABS: 50.0000 mg | ORAL_TABLET | Freq: Every day | ORAL | 1 refills | Status: DC

## 2020-04-07 NOTE — Telephone Encounter (Signed)
Rx's sent in. °

## 2020-04-07 NOTE — Telephone Encounter (Signed)
Please call the following medication into the drug store     ALPRAZolam (XANAX) 0.5 MG tablet  amLODipine-olmesartan (AZOR) 5-20 MG tablet  azelastine (ASTELIN) 0.1 % nasal spray  sertraline (ZOLOFT) 50 MG tablet  And Fosomax   Thanks

## 2020-04-07 NOTE — Telephone Encounter (Signed)
Patient is needing refills sent to [facility] on  Fosamax Amlodipine zoloft clarinex Azelastine

## 2020-04-08 NOTE — Telephone Encounter (Signed)
Looks like these were sent in yesterday.

## 2020-04-17 ENCOUNTER — Other Ambulatory Visit: Payer: Self-pay | Admitting: Nurse Practitioner

## 2020-04-17 ENCOUNTER — Telehealth: Payer: Self-pay

## 2020-04-17 MED ORDER — ALPRAZOLAM 0.5 MG PO TABS: 0.5000 mg | ORAL_TABLET | Freq: Every day | ORAL | 2 refills | Status: DC

## 2020-04-17 MED ORDER — ALENDRONATE SODIUM 70 MG PO TABS: 70.0000 mg | ORAL_TABLET | ORAL | 11 refills | Status: DC

## 2020-04-17 NOTE — Telephone Encounter (Signed)
Please refill.

## 2020-04-17 NOTE — Telephone Encounter (Signed)
Patient needing refills on xanax and fosemax p# [phone]

## 2020-04-17 NOTE — Telephone Encounter (Signed)
Sent!

## 2020-04-30 ENCOUNTER — Encounter: Payer: Self-pay | Admitting: Internal Medicine

## 2020-05-27 ENCOUNTER — Ambulatory Visit: Payer: 59 | Admitting: Nurse Practitioner

## 2020-06-03 ENCOUNTER — Other Ambulatory Visit: Payer: Self-pay

## 2020-06-03 DIAGNOSIS — G4700 Insomnia, unspecified: Secondary | ICD-10-CM

## 2020-06-03 DIAGNOSIS — J302 Other seasonal allergic rhinitis: Secondary | ICD-10-CM

## 2020-06-03 DIAGNOSIS — I10 Essential (primary) hypertension: Secondary | ICD-10-CM

## 2020-06-03 MED ORDER — DESLORATADINE 5 MG PO TABS: 5.0000 mg | ORAL_TABLET | Freq: Every day | ORAL | 1 refills | Status: DC

## 2020-06-03 MED ORDER — SERTRALINE HCL 50 MG PO TABS: 50.0000 mg | ORAL_TABLET | Freq: Every day | ORAL | 1 refills | Status: DC

## 2020-06-03 MED ORDER — AMLODIPINE-OLMESARTAN 5-20 MG PO TABS: 1.0000 | ORAL_TABLET | Freq: Every day | ORAL | 1 refills | Status: DC

## 2020-06-21 ENCOUNTER — Encounter: Payer: Self-pay | Admitting: Gastroenterology

## 2020-06-21 NOTE — Progress Notes (Signed)
Primary Care Physician:  Heather Roberts, NP Primary Gastroenterologist:  Dr. Jena Gauss  Chief Complaint  Patient presents with  . Consult    Due for 10 yr TCS    HPI:   Carol Maldonado is a 60 y.o. female presenting today to discuss scheduling colonoscopy.  Last colonoscopy 05/27/2010 with entirely normal exam and recommended repeat in 10 years.  We have not seen patient since her last colonoscopy.  Today she states she has been doing fairly well overall.  No significant GI symptoms.  Denies abdominal pain, constipation, diarrhea, blood in the stool, black stool, unintentional weight loss. Trying to lose weight with intermittent fasting. Has lost about 15 lbs. No nausea, vomiting, GERD, or dysphagia.     Past Medical History:  Diagnosis Date  . Allergy    Phreesia 02/29/2020  . Anxiety    Phreesia 02/29/2020  . Hyperlipidemia    Phreesia 02/29/2020  . Hypertension    Phreesia 02/29/2020  . Insomnia   . Osteoporosis    Phreesia 02/29/2020  . Seasonal allergies     Past Surgical History:  Procedure Laterality Date  . COLONOSCOPY  03/27/2011   Dr. Jena Gauss; entirely normal exam and recommended repeat in 10 years.    Current Outpatient Medications  Medication Sig Dispense Refill  . albuterol (VENTOLIN HFA) 108 (90 Base) MCG/ACT inhaler Inhale 2 puffs into the lungs as needed. 6.7 g 3  . alendronate (FOSAMAX) 70 MG tablet Take 1 tablet (70 mg total) by mouth every 7 (seven) days. Take with a full glass of water on an empty stomach. 4 tablet 11  . ALPRAZolam (XANAX) 0.5 MG tablet Take 1 tablet (0.5 mg total) by mouth at bedtime. 30 tablet 2  . amLODipine-olmesartan (AZOR) 5-40 MG tablet Take 1 tablet by mouth daily. 90 tablet 1  . azelastine (ASTELIN) 0.1 % nasal spray Place 1 spray into both nostrils 2 (two) times daily. Use in each nostril as directed 30 mL 1  . desloratadine (CLARINEX) 5 MG tablet Take 1 tablet (5 mg total) by mouth daily. 30 tablet 1  . diphenhydrAMINE HCl  (BENADRYL PO) Take by mouth as needed.    . flunisolide (NASALIDE) 25 MCG/ACT (0.025%) SOLN Place 2 sprays into the nose 2 (two) times daily.    Marland Kitchen ibuprofen (ADVIL,MOTRIN) 200 MG tablet Take 400 mg by mouth as needed.    . sertraline (ZOLOFT) 50 MG tablet Take 1 tablet (50 mg total) by mouth at bedtime. 30 tablet 1   No current facility-administered medications for this visit.    Allergies as of 06/23/2020 - Review Complete 06/23/2020  Allergen Reaction Noted  . Amoxicillin Hives, Itching, and Rash 02/29/2020  . Codeine Hives, Itching, and Rash 02/29/2020  . Elemental sulfur Rash 02/25/2015  . Hydrocodone Rash 02/25/2015  . Keflex [cephalexin] Rash 02/25/2015  . Penicillins Rash 02/25/2015    Family History  Problem Relation Age of Onset  . Hypertension Mother   . Heart attack Mother   . Pneumonia Maternal Grandmother   . Other Maternal Grandfather        MVA  . Hypertension Father   . High Cholesterol Father   . Irritable bowel syndrome Sister   . Irritable bowel syndrome Sister   . Colon cancer Neg Hx     Social History   Socioeconomic History  . Marital status: Single    Spouse name: Not on file  . Number of children: Not on file  . Years of education:  Not on file  . Highest education level: Not on file  Occupational History  . Not on file  Tobacco Use  . Smoking status: Never Smoker  . Smokeless tobacco: Never Used  Vaping Use  . Vaping Use: Never used  Substance and Sexual Activity  . Alcohol use: Never    Alcohol/week: 0.0 standard drinks  . Drug use: Never  . Sexual activity: Not Currently    Birth control/protection: Post-menopausal  Other Topics Concern  . Not on file  Social History Narrative  . Not on file   Social Determinants of Health   Financial Resource Strain: Not on file  Food Insecurity: Not on file  Transportation Needs: Not on file  Physical Activity: Not on file  Stress: Not on file  Social Connections: Not on file  Intimate  Partner Violence: Not on file    Review of Systems: Gen: Denies any fever, chills, cold or flulike symptoms, lightheadedness, dizziness, presyncope, syncope. CV: Denies chest pain or heart palpitations. Resp: Denies shortness of breath or cough. GI: See HPI GU : Denies urinary burning, urinary frequency, urinary hesitancy MS: Intermittent pain in feet or legs related to long days at work.  Derm: Denies rash Psych: Admits to anxiety.  Well controlled with medications. Heme: See HPI  Physical Exam: BP (!) 156/90   Pulse 77   Temp (!) 97.1 F (36.2 C)   Ht 5\' 1"  (1.549 m)   Wt 140 lb 6.4 oz (63.7 kg)   BMI 26.53 kg/m  General:   Alert and oriented. Pleasant and cooperative. Well-nourished and well-developed.  Head:  Normocephalic and atraumatic. Eyes:  Without icterus, sclera clear and conjunctiva pink.  Ears:  Normal auditory acuity. Lungs:  Clear to auscultation bilaterally. No wheezes, rales, or rhonchi. No distress.  Heart:  S1, S2 present without murmurs appreciated.  Abdomen:  +BS, soft, non-tender and non-distended. No HSM noted. No guarding or rebound. No masses appreciated.  Rectal:  Deferred  Msk:  Symmetrical without gross deformities. Normal posture. Extremities:  Without edema. Neurologic:  Alert and  oriented x4;  grossly normal neurologically. Skin:  Intact without significant lesions or rashes. Psych: Normal mood and affect.

## 2020-06-23 ENCOUNTER — Encounter: Payer: Self-pay | Admitting: Nurse Practitioner

## 2020-06-23 ENCOUNTER — Ambulatory Visit: Payer: 59 | Admitting: Gastroenterology

## 2020-06-23 ENCOUNTER — Encounter: Payer: Self-pay | Admitting: Gastroenterology

## 2020-06-23 ENCOUNTER — Ambulatory Visit: Payer: 59 | Admitting: Nurse Practitioner

## 2020-06-23 ENCOUNTER — Other Ambulatory Visit: Payer: Self-pay

## 2020-06-23 VITALS — BP 156/90 | HR 77 | Temp 97.1°F | Ht 61.0 in | Wt 140.4 lb

## 2020-06-23 DIAGNOSIS — G4700 Insomnia, unspecified: Secondary | ICD-10-CM

## 2020-06-23 DIAGNOSIS — Z1211 Encounter for screening for malignant neoplasm of colon: Secondary | ICD-10-CM | POA: Diagnosis not present

## 2020-06-23 DIAGNOSIS — J302 Other seasonal allergic rhinitis: Secondary | ICD-10-CM | POA: Diagnosis not present

## 2020-06-23 DIAGNOSIS — I10 Essential (primary) hypertension: Secondary | ICD-10-CM

## 2020-06-23 MED ORDER — ALBUTEROL SULFATE HFA 108 (90 BASE) MCG/ACT IN AERS: 2.0000 | INHALATION_SPRAY | RESPIRATORY_TRACT | 3 refills | Status: DC | PRN

## 2020-06-23 MED ORDER — ALENDRONATE SODIUM 70 MG PO TABS: 70.0000 mg | ORAL_TABLET | ORAL | 11 refills | Status: DC

## 2020-06-23 MED ORDER — AMLODIPINE-OLMESARTAN 5-40 MG PO TABS: 1.0000 | ORAL_TABLET | Freq: Every day | ORAL | 1 refills | Status: DC

## 2020-06-23 MED ORDER — ALPRAZOLAM 0.5 MG PO TABS: 0.5000 mg | ORAL_TABLET | Freq: Every day | ORAL | 2 refills | Status: DC

## 2020-06-23 MED ORDER — DESLORATADINE 5 MG PO TABS: 5.0000 mg | ORAL_TABLET | Freq: Every day | ORAL | 1 refills | Status: DC

## 2020-06-23 MED ORDER — SERTRALINE HCL 50 MG PO TABS: 50.0000 mg | ORAL_TABLET | Freq: Every day | ORAL | 1 refills | Status: DC

## 2020-06-23 NOTE — Patient Instructions (Signed)
We will arrange for you to have a colonoscopy in the near future with Dr. Jena Gauss.  We will follow up with you in the office as Dr. Jena Gauss recommends.  Do not hesitate to call if you have any new GI concerns.  Ermalinda Memos, PA-C [facility]

## 2020-06-23 NOTE — Progress Notes (Signed)
Established Patient Office Visit  Subjective:  Patient ID: Carol Maldonado, female    DOB: 06-24-1960  Age: 60 y.o. MRN: [id number]  CC:  Chief Complaint  Patient presents with  . Hypertension    Follow up     HPI Carol Maldonado presents for med check for xanax. At her last OV, her BP was elevated, and she has been taking amlodipine-olmesartan 5/20. She states she needs refills on everything. No issues, and she has been taking xanax at night.   Past Medical History:  Diagnosis Date  . Allergy    Phreesia 02/29/2020  . Anxiety    Phreesia 02/29/2020  . CKD (chronic kidney disease)   . Hyperlipidemia    Phreesia 02/29/2020  . Hypertension    Phreesia 02/29/2020  . Insomnia   . Osteoporosis    Phreesia 02/29/2020  . Seasonal allergies     Past Surgical History:  Procedure Laterality Date  . COLONOSCOPY  03/27/2011   Dr. Jena Gauss; entirely normal exam and recommended repeat in 10 years.    Family History  Problem Relation Age of Onset  . Hypertension Mother   . Heart attack Mother   . Pneumonia Maternal Grandmother   . Other Maternal Grandfather        MVA  . Hypertension Father   . High Cholesterol Father   . Irritable bowel syndrome Sister   . Irritable bowel syndrome Sister     Social History   Socioeconomic History  . Marital status: Single    Spouse name: Not on file  . Number of children: Not on file  . Years of education: Not on file  . Highest education level: Not on file  Occupational History  . Not on file  Tobacco Use  . Smoking status: Never Smoker  . Smokeless tobacco: Never Used  Vaping Use  . Vaping Use: Never used  Substance and Sexual Activity  . Alcohol use: Never    Alcohol/week: 0.0 standard drinks  . Drug use: Never  . Sexual activity: Not Currently    Birth control/protection: Post-menopausal  Other Topics Concern  . Not on file  Social History Narrative  . Not on file   Social Determinants of Health   Financial  Resource Strain: Not on file  Food Insecurity: Not on file  Transportation Needs: Not on file  Physical Activity: Not on file  Stress: Not on file  Social Connections: Not on file  Intimate Partner Violence: Not on file    Outpatient Medications Prior to Visit  Medication Sig Dispense Refill  . amLODipine-olmesartan (AZOR) 5-20 MG tablet Take 1 tablet by mouth daily. 30 tablet 1  . azelastine (ASTELIN) 0.1 % nasal spray Place 1 spray into both nostrils 2 (two) times daily. Use in each nostril as directed 30 mL 1  . diphenhydrAMINE HCl (BENADRYL PO) Take by mouth as needed.    . flunisolide (NASALIDE) 25 MCG/ACT (0.025%) SOLN Place 2 sprays into the nose 2 (two) times daily.    Marland Kitchen ibuprofen (ADVIL,MOTRIN) 200 MG tablet Take 400 mg by mouth as needed.    Marland Kitchen albuterol (VENTOLIN HFA) 108 (90 Base) MCG/ACT inhaler Inhale 2 puffs into the lungs as needed.     Marland Kitchen alendronate (FOSAMAX) 70 MG tablet Take 1 tablet (70 mg total) by mouth every 7 (seven) days. Take with a full glass of water on an empty stomach. 4 tablet 11  . ALPRAZolam (XANAX) 0.5 MG tablet Take 1 tablet (0.5 mg total) by  mouth at bedtime. 30 tablet 2  . desloratadine (CLARINEX) 5 MG tablet Take 1 tablet (5 mg total) by mouth daily. 30 tablet 1  . sertraline (ZOLOFT) 50 MG tablet Take 1 tablet (50 mg total) by mouth at bedtime. 30 tablet 1   No facility-administered medications prior to visit.    Allergies  Allergen Reactions  . Amoxicillin Hives, Itching and Rash  . Codeine Hives, Itching and Rash  . Elemental Sulfur Rash  . Hydrocodone Rash  . Keflex [Cephalexin] Rash  . Penicillins Rash    ROS Review of Systems  Constitutional: Negative.   Respiratory: Negative.   Cardiovascular: Negative.   Musculoskeletal: Negative.   Psychiatric/Behavioral: Negative.       Objective:    Physical Exam Constitutional:      Appearance: Normal appearance.  Cardiovascular:     Rate and Rhythm: Normal rate and regular rhythm.      Pulses: Normal pulses.     Heart sounds: Normal heart sounds.  Pulmonary:     Effort: Pulmonary effort is normal.     Breath sounds: Normal breath sounds.  Neurological:     Mental Status: She is alert.  Psychiatric:        Mood and Affect: Mood normal.        Behavior: Behavior normal.        Thought Content: Thought content normal.        Judgment: Judgment normal.     BP (!) 177/92   Pulse 76   Temp 98.1 F (36.7 C)   Resp 20   Ht 5\' 1"  (1.549 m)   Wt 139 lb (63 kg)   SpO2 95%   BMI 26.26 kg/m  Wt Readings from Last 3 Encounters:  06/23/20 139 lb (63 kg)  03/03/20 131 lb (59.4 kg)  03/09/18 151 lb 8 oz (68.7 kg)     Health Maintenance Due  Topic Date Due  . Hepatitis C Screening  Never done  . HIV Screening  Never done  . COVID-19 Vaccine (3 - Booster for Moderna series) 01/02/2020  . COLONOSCOPY (Pts 45-69yrs Insurance coverage will need to be confirmed)  05/27/2020    There are no preventive care reminders to display for this patient.  No results found for: TSH Lab Results  Component Value Date   WBC 6.9 02/28/2020   HGB 11.1 02/28/2020   HCT 33.5 (L) 02/28/2020   MCV 83 02/28/2020   PLT 203 02/28/2020   Lab Results  Component Value Date   NA 139 02/28/2020   K 5.0 02/28/2020   CO2 24 02/28/2020   GLUCOSE 91 02/28/2020   BUN 14 02/28/2020   CREATININE 1.25 (H) 02/28/2020   BILITOT 0.3 02/28/2020   ALKPHOS 66 02/28/2020   AST 16 02/28/2020   ALT 10 02/28/2020   PROT 7.7 02/28/2020   ALBUMIN 4.9 (H) 02/28/2020   CALCIUM 9.8 02/28/2020   Lab Results  Component Value Date   CHOL 131 02/28/2020   Lab Results  Component Value Date   HDL 71 02/28/2020   Lab Results  Component Value Date   LDLCALC 48 02/28/2020   Lab Results  Component Value Date   TRIG 55 02/28/2020   Lab Results  Component Value Date   CHOLHDL 1.8 02/28/2020   Lab Results  Component Value Date   HGBA1C 5.8 (H) 02/28/2020      Assessment & Plan:   Problem  List Items Addressed This Visit      Cardiovascular and  Mediastinum   Essential hypertension, benign    BP Readings from Last 3 Encounters:  06/23/20 (!) 177/92  03/03/20 (!) 153/93  03/09/18 (!) 140/92  -BP elevated today, but she was stress coming around multiple detours and was worried about being late -manual recheck of BP was           Respiratory   Seasonal allergic rhinitis   Relevant Medications   desloratadine (CLARINEX) 5 MG tablet     Other   Insomnia, unspecified    -refilled xanax      Relevant Medications   sertraline (ZOLOFT) 50 MG tablet      Meds ordered this encounter  Medications  . albuterol (VENTOLIN HFA) 108 (90 Base) MCG/ACT inhaler    Sig: Inhale 2 puffs into the lungs as needed.    Dispense:  6.7 g    Refill:  3  . alendronate (FOSAMAX) 70 MG tablet    Sig: Take 1 tablet (70 mg total) by mouth every 7 (seven) days. Take with a full glass of water on an empty stomach.    Dispense:  4 tablet    Refill:  11  . ALPRAZolam (XANAX) 0.5 MG tablet    Sig: Take 1 tablet (0.5 mg total) by mouth at bedtime.    Dispense:  30 tablet    Refill:  2    Do not dispense if < 30 days since last fill  . desloratadine (CLARINEX) 5 MG tablet    Sig: Take 1 tablet (5 mg total) by mouth daily.    Dispense:  30 tablet    Refill:  1  . sertraline (ZOLOFT) 50 MG tablet    Sig: Take 1 tablet (50 mg total) by mouth at bedtime.    Dispense:  30 tablet    Refill:  1    Follow-up: Return in about 4 months (around 10/23/2020) for Med and BP check.    Heather Roberts, NP

## 2020-06-23 NOTE — Assessment & Plan Note (Signed)
-  refilled xanax 

## 2020-06-23 NOTE — Assessment & Plan Note (Addendum)
BP Readings from Last 3 Encounters:  06/23/20 (!) 177/92  03/03/20 (!) 153/93  03/09/18 (!) 140/92  -BP elevated today, but she was stress coming around multiple detours and was worried about being late -manual recheck of BP was 160/88 -INCREASE amlodipine-olmesartan to 5/40

## 2020-06-23 NOTE — Assessment & Plan Note (Signed)
60 year old female with history of allergies, anxiety, insomnia, HTN, HLD presenting today to schedule screening colonoscopy.  Last colonoscopy in February 2012 with entirely normal exam.  She has no significant upper or lower GI symptoms.  No alarm symptoms.  No family history of colon cancer.  Plan: 1.  Proceed with colonoscopy with propofol with Dr. Jena Gauss in the near future. The risks, benefits, and alternatives have been discussed with the patient in detail. The patient states understanding and desires to proceed.  ASA II 2.  Follow-up as needed.  Advised to call with any new GI concerns.

## 2020-06-23 NOTE — Patient Instructions (Signed)
Please check blood pressure (BP) at home. If BP is consistently higher than 140/90, please setup a BP check after taking the medicine for about 2 weeks. I increased your BP medicine medicine to amlodipine-olmesartan 5-40 (from 5-20).

## 2020-07-14 ENCOUNTER — Telehealth: Payer: Self-pay

## 2020-07-14 ENCOUNTER — Other Ambulatory Visit: Payer: Self-pay

## 2020-07-14 MED ORDER — SUPREP BOWEL PREP KIT 17.5-3.13-1.6 GM/177ML PO SOLN: 1.0000 | ORAL | 0 refills | Status: DC

## 2020-07-14 NOTE — Telephone Encounter (Signed)
Tried to call pt to schedule TCS w/Propofol ASA 2 with Dr. Jena Gauss, LMOVM for return call.

## 2020-07-14 NOTE — Telephone Encounter (Signed)
COVID test appt letter mailed with procedure instructions.

## 2020-07-14 NOTE — Telephone Encounter (Signed)
PA for TCS submitted via [facility] website. PA# [id number], valid 08/28/20-11/26/20.

## 2020-07-14 NOTE — Telephone Encounter (Signed)
Spoke to pt, TCS scheduled for 08/28/20 at 8:30am. COVID test 08/26/20 at 11:20am. Rx for prep sent to pharmacy.

## 2020-07-29 ENCOUNTER — Telehealth: Payer: Self-pay

## 2020-07-29 NOTE — Telephone Encounter (Signed)
Let's set up an appointment.

## 2020-07-29 NOTE — Telephone Encounter (Signed)
Called the pt we will see her next Tues 5-10 at 420

## 2020-07-29 NOTE — Telephone Encounter (Signed)
Pt states that her blood pressure is for the most part doing better but that it is not keeping it down, it is up and down. The lowest its been is 132/88. But it is primarily staying in the 140's/90s and she is very concerned about her kidneys because her mother was on dialysis due to uncontrolled b/p and she is worried about getting this under control.

## 2020-08-05 ENCOUNTER — Ambulatory Visit: Payer: 59 | Admitting: Nurse Practitioner

## 2020-08-05 ENCOUNTER — Other Ambulatory Visit: Payer: Self-pay

## 2020-08-05 ENCOUNTER — Encounter: Payer: Self-pay | Admitting: Nurse Practitioner

## 2020-08-05 VITALS — BP 165/90 | HR 81 | Temp 98.2°F | Resp 20 | Ht 61.0 in | Wt 135.0 lb

## 2020-08-05 DIAGNOSIS — N1831 Chronic kidney disease, stage 3a: Secondary | ICD-10-CM | POA: Diagnosis not present

## 2020-08-05 DIAGNOSIS — I10 Essential (primary) hypertension: Secondary | ICD-10-CM | POA: Diagnosis not present

## 2020-08-05 DIAGNOSIS — N1832 Chronic kidney disease, stage 3b: Secondary | ICD-10-CM | POA: Insufficient documentation

## 2020-08-05 MED ORDER — AMLODIPINE-OLMESARTAN 10-40 MG PO TABS: 1.0000 | ORAL_TABLET | Freq: Every day | ORAL | 3 refills | Status: DC

## 2020-08-05 NOTE — Assessment & Plan Note (Signed)
BP Readings from Last 3 Encounters:  08/05/20 (!) 165/90  06/23/20 (!) 156/90  06/23/20 (!) 177/92  -she denies drinking EtOH, and she stats she doesn't sore and has lost weight intentionally lately; seems low risk for OSA

## 2020-08-05 NOTE — Progress Notes (Signed)
Acute Office Visit  Subjective:    Patient ID: Carol Maldonado, female    DOB: 08-Mar-1961, 60 y.o.   MRN: [id number]  Chief Complaint  Patient presents with  . Hypertension    HPI Patient is in today for BP check.  At her visit on 06/23/20, we increased her amlodipine-olmesartan to 5/40 from 5/20.  She is concerned because her mother was on dialysis after having elevated BP.  Past Medical History:  Diagnosis Date  . Allergy    Phreesia 02/29/2020  . Anxiety    Phreesia 02/29/2020  . Hyperlipidemia    Phreesia 02/29/2020  . Hypertension    [employer]reesia 02/29/2020  . Insomnia   . Osteoporosis    Phreesia 02/29/2020  . Seasonal allergies     Past Surgical History:  Procedure Laterality Date  . COLONOSCOPY  03/27/2011   Dr. Gala Romney; entirely normal exam and recommended repeat in 10 years.    Family History  Problem Relation Age of Onset  . Hypertension Mother   . Heart attack Mother   . Pneumonia Maternal Grandmother   . Other Maternal Grandfather        MVA  . Hypertension Father   . High Cholesterol Father   . Irritable bowel syndrome Sister   . Irritable bowel syndrome Sister   . Colon cancer Neg Hx     Social History   Socioeconomic History  . Marital status: Single    Spouse name: Not on file  . Number of children: Not on file  . Years of education: Not on file  . Highest education level: Not on file  Occupational History  . Not on file  Tobacco Use  . Smoking status: Never Smoker  . Smokeless tobacco: Never Used  Vaping Use  . Vaping Use: Never used  Substance and Sexual Activity  . Alcohol use: Never    Alcohol/week: 0.0 standard drinks  . Drug use: Never  . Sexual activity: Not Currently    Birth control/protection: Post-menopausal  Other Topics Concern  . Not on file  Social History Narrative  . Not on file   Social Determinants of Health   Financial Resource Strain: Not on file  Food Insecurity: Not on file  Transportation Needs: Not  on file  Physical Activity: Not on file  Stress: Not on file  Social Connections: Not on file  Intimate Partner Violence: Not on file    Outpatient Medications Prior to Visit  Medication Sig Dispense Refill  . albuterol (VENTOLIN HFA) 108 (90 Base) MCG/ACT inhaler Inhale 2 puffs into the lungs as needed. 6.7 g 3  . alendronate (FOSAMAX) 70 MG tablet Take 1 tablet (70 mg total) by mouth every 7 (seven) days. Take with a full glass of water on an empty stomach. 4 tablet 11  . ALPRAZolam (XANAX) 0.5 MG tablet Take 1 tablet (0.5 mg total) by mouth at bedtime. 30 tablet 2  . azelastine (ASTELIN) 0.1 % nasal spray Place 1 spray into both nostrils 2 (two) times daily. Use in each nostril as directed 30 mL 1  . desloratadine (CLARINEX) 5 MG tablet Take 1 tablet (5 mg total) by mouth daily. 30 tablet 1  . diphenhydrAMINE HCl (BENADRYL PO) Take by mouth as needed.    . flunisolide (NASALIDE) 25 MCG/ACT (0.025%) SOLN Place 2 sprays into the nose 2 (two) times daily.    Marland Kitchen ibuprofen (ADVIL,MOTRIN) 200 MG tablet Take 400 mg by mouth as needed.    . Na Sulfate-K Sulfate-Mg  Sulf (SUPREP BOWEL PREP KIT) 17.5-3.13-1.6 GM/177ML SOLN Take 1 kit by mouth as directed. 354 mL 0  . sertraline (ZOLOFT) 50 MG tablet Take 1 tablet (50 mg total) by mouth at bedtime. 30 tablet 1  . amLODipine-olmesartan (AZOR) 5-40 MG tablet Take 1 tablet by mouth daily. 90 tablet 1   No facility-administered medications prior to visit.    Allergies  Allergen Reactions  . Amoxicillin Hives, Itching and Rash  . Codeine Hives, Itching and Rash  . Elemental Sulfur Rash  . Hydrocodone Rash  . Keflex [Cephalexin] Rash  . Penicillins Rash    Review of Systems  Constitutional: Negative.   Respiratory: Negative.   Cardiovascular: Negative.        Home BP readings have been in the 140s/90s recently  Musculoskeletal: Negative.   Psychiatric/Behavioral: Negative.        Objective:    Physical Exam Constitutional:       Appearance: Normal appearance.  Cardiovascular:     Rate and Rhythm: Normal rate and regular rhythm.     Pulses: Normal pulses.     Heart sounds: Normal heart sounds.  Pulmonary:     Effort: Pulmonary effort is normal.     Breath sounds: Normal breath sounds.  Musculoskeletal:        General: Normal range of motion.  Neurological:     Mental Status: She is alert.  Psychiatric:        Mood and Affect: Mood normal.        Behavior: Behavior normal.        Thought Content: Thought content normal.        Judgment: Judgment normal.     BP (!) 165/90   Pulse 81   Temp 98.2 F (36.8 C)   Resp 20   Ht $R'5\' 1"'BA$  (1.549 m)   Wt 135 lb (61.2 kg)   SpO2 96%   BMI 25.51 kg/m  Wt Readings from Last 3 Encounters:  08/05/20 135 lb (61.2 kg)  06/23/20 140 lb 6.4 oz (63.7 kg)  06/23/20 139 lb (63 kg)    Health Maintenance Due  Topic Date Due  . HIV Screening  Never done  . Hepatitis C Screening  Never done  . COVID-19 Vaccine (3 - Booster for Moderna series) 01/02/2020  . COLONOSCOPY (Pts 45-81yrs Insurance coverage will need to be confirmed)  05/27/2020    There are no preventive care reminders to display for this patient.   No results found for: TSH Lab Results  Component Value Date   WBC 6.9 02/28/2020   HGB 11.1 02/28/2020   HCT 33.5 (L) 02/28/2020   MCV 83 02/28/2020   PLT 203 02/28/2020   Lab Results  Component Value Date   NA 139 02/28/2020   K 5.0 02/28/2020   CO2 24 02/28/2020   GLUCOSE 91 02/28/2020   BUN 14 02/28/2020   CREATININE 1.25 (H) 02/28/2020   BILITOT 0.3 02/28/2020   ALKPHOS 66 02/28/2020   AST 16 02/28/2020   ALT 10 02/28/2020   PROT 7.7 02/28/2020   ALBUMIN 4.9 (H) 02/28/2020   CALCIUM 9.8 02/28/2020   Lab Results  Component Value Date   CHOL 131 02/28/2020   Lab Results  Component Value Date   HDL 71 02/28/2020   Lab Results  Component Value Date   LDLCALC 48 02/28/2020   Lab Results  Component Value Date   TRIG 55 02/28/2020    Lab Results  Component Value Date   CHOLHDL 1.8  02/28/2020   Lab Results  Component Value Date   HGBA1C 5.8 (H) 02/28/2020       Assessment & Plan:   Problem List Items Addressed This Visit      Cardiovascular and Mediastinum   Essential hypertension, benign    BP Readings from Last 3 Encounters:  08/05/20 (!) 165/90  06/23/20 (!) 156/90  06/23/20 (!) 177/92  -she denies drinking EtOH, and she stats she doesn't sore and has lost weight intentionally lately; seems low risk for OSA      Relevant Medications   amLODipine-olmesartan (AZOR) 10-40 MG tablet     Genitourinary   Stage 3a chronic kidney disease (HCC)    -HTN has been worse recently, and she has not been responding well to medication changes -her mother had to be on HD for ESRD, and she is concerned -referral to nephrology -we discussed maintaining hydration, and she states she is drinking about 100 oz of water while at work       Other Visit Diagnoses    Chronic kidney disease, stage 3a ([place])    -  Primary   Relevant Orders   Ambulatory referral to Nephrology       Meds ordered this encounter  Medications  . amLODipine-olmesartan (AZOR) 10-40 MG tablet    Sig: Take 1 tablet by mouth daily.    Dispense:  90 tablet    Refill:  [place], NP

## 2020-08-05 NOTE — Assessment & Plan Note (Signed)
-  HTN has been worse recently, and she has not been responding well to medication changes -her mother had to be on HD for ESRD, and she is concerned -referral to nephrology -we discussed maintaining hydration, and she states she is drinking about 100 oz of water while at work

## 2020-08-05 NOTE — Patient Instructions (Signed)
Please have fasting labs drawn 2-3 days prior to your appointment so we can discuss the results during your office visit.  

## 2020-08-26 ENCOUNTER — Other Ambulatory Visit: Payer: Self-pay

## 2020-08-26 ENCOUNTER — Other Ambulatory Visit (HOSPITAL_COMMUNITY)
Admission: RE | Admit: 2020-08-26 | Discharge: 2020-08-26 | Disposition: A | Discharge status: Home / Self Care | Payer: 59 | Source: Ambulatory Visit | Attending: Internal Medicine | Admitting: Internal Medicine

## 2020-08-26 DIAGNOSIS — Z01812 Encounter for preprocedural laboratory examination: Secondary | ICD-10-CM | POA: Insufficient documentation

## 2020-08-26 DIAGNOSIS — Z20822 Contact with and (suspected) exposure to covid-19: Secondary | ICD-10-CM | POA: Diagnosis not present

## 2020-08-26 LAB — SARS CORONAVIRUS 2 (TAT 6-24 HRS): SARS Coronavirus 2: NEGATIVE

## 2020-08-28 ENCOUNTER — Ambulatory Visit (HOSPITAL_COMMUNITY): Payer: 59 | Admitting: Anesthesiology

## 2020-08-28 ENCOUNTER — Encounter (HOSPITAL_COMMUNITY)
Admission: RE | Disposition: A | Discharge status: Home / Self Care | Payer: Self-pay | Source: Home / Self Care | Attending: Internal Medicine

## 2020-08-28 ENCOUNTER — Encounter (HOSPITAL_COMMUNITY): Payer: Self-pay | Admitting: Internal Medicine

## 2020-08-28 ENCOUNTER — Ambulatory Visit (HOSPITAL_COMMUNITY)
Admission: RE | Admit: 2020-08-28 | Discharge: 2020-08-28 | Disposition: A | Discharge status: Home / Self Care | Payer: 59 | Attending: Internal Medicine | Admitting: Internal Medicine

## 2020-08-28 ENCOUNTER — Other Ambulatory Visit: Payer: Self-pay

## 2020-08-28 DIAGNOSIS — K5730 Diverticulosis of large intestine without perforation or abscess without bleeding: Secondary | ICD-10-CM | POA: Insufficient documentation

## 2020-08-28 DIAGNOSIS — Z8349 Family history of other endocrine, nutritional and metabolic diseases: Secondary | ICD-10-CM | POA: Diagnosis not present

## 2020-08-28 DIAGNOSIS — Z885 Allergy status to narcotic agent status: Secondary | ICD-10-CM | POA: Diagnosis not present

## 2020-08-28 DIAGNOSIS — Z882 Allergy status to sulfonamides status: Secondary | ICD-10-CM | POA: Insufficient documentation

## 2020-08-28 DIAGNOSIS — Z1211 Encounter for screening for malignant neoplasm of colon: Secondary | ICD-10-CM | POA: Insufficient documentation

## 2020-08-28 DIAGNOSIS — Z79899 Other long term (current) drug therapy: Secondary | ICD-10-CM | POA: Diagnosis not present

## 2020-08-28 DIAGNOSIS — Z881 Allergy status to other antibiotic agents status: Secondary | ICD-10-CM | POA: Insufficient documentation

## 2020-08-28 DIAGNOSIS — Z8371 Family history of colonic polyps: Secondary | ICD-10-CM | POA: Diagnosis not present

## 2020-08-28 DIAGNOSIS — Z8379 Family history of other diseases of the digestive system: Secondary | ICD-10-CM | POA: Insufficient documentation

## 2020-08-28 DIAGNOSIS — Z8249 Family history of ischemic heart disease and other diseases of the circulatory system: Secondary | ICD-10-CM | POA: Diagnosis not present

## 2020-08-28 DIAGNOSIS — Z880 Allergy status to penicillin: Secondary | ICD-10-CM | POA: Diagnosis not present

## 2020-08-28 HISTORY — PX: COLONOSCOPY WITH PROPOFOL: SHX5780

## 2020-08-28 SURGERY — COLONOSCOPY WITH PROPOFOL
Anesthesia: General

## 2020-08-28 MED ORDER — PROPOFOL 10 MG/ML IV BOLUS
INTRAVENOUS | Status: DC | PRN
  Administered 2020-08-28: 40 mg via INTRAVENOUS
  Administered 2020-08-28: 100 mg via INTRAVENOUS

## 2020-08-28 MED ORDER — PROPOFOL 500 MG/50ML IV EMUL
INTRAVENOUS | Status: DC | PRN
  Administered 2020-08-28: 150 ug/kg/min via INTRAVENOUS

## 2020-08-28 MED ORDER — LIDOCAINE HCL (CARDIAC) PF 100 MG/5ML IV SOSY
PREFILLED_SYRINGE | INTRAVENOUS | Status: DC | PRN
  Administered 2020-08-28: 50 mg via INTRAVENOUS

## 2020-08-28 MED ORDER — LACTATED RINGERS IV SOLN: INTRAVENOUS | Status: DC

## 2020-08-28 NOTE — Op Note (Signed)
[facility] Patient Name: Carol Maldonado Procedure Date: 08/28/2020 7:01 AM MRN: [id number] Date of Birth: 11-06-1960 Attending MD: Gennette Pac , MD CSN: [id number] Age: 60 Admit Type: Outpatient Procedure:                Colonoscopy Indications:              Screening for colorectal malignant neoplasm Providers:                Gennette Pac, MD, Loma Messing B. Patsy Lager, RN,                            Pandora Leiter, Technician Referring MD:              Medicines:                Propofol per Anesthesia Complications:            No immediate complications. Estimated Blood Loss:     Estimated blood loss: none. Procedure:                Pre-Anesthesia Assessment:                           - Prior to the procedure, a History and Physical                            was performed, and patient medications and                            allergies were reviewed. The patient's tolerance of                            previous anesthesia was also reviewed. The risks                            and benefits of the procedure and the sedation                            options and risks were discussed with the patient.                            All questions were answered, and informed consent                            was obtained. ASA Grade Assessment: III - A patient                            with severe systemic disease. After reviewing the                            risks and benefits, the patient was deemed in                            satisfactory condition to undergo the procedure.  After obtaining informed consent, the colonoscope                            was passed under direct vision. Throughout the                            procedure, the patient's blood pressure, pulse, and                            oxygen saturations were monitored continuously. The                            [id number] ([id number]) scope was introduced through                             the anus and advanced to the the cecum, identified                            by appendiceal orifice and ileocecal valve. Scope In: 8:30:25 AM Scope Out: 8:41:20 AM Scope Withdrawal Time: 0 hours 6 minutes 55 seconds  Total Procedure Duration: 0 hours 10 minutes 55 seconds  Findings:      The perianal and digital rectal examinations were normal.      Scattered small-mouthed diverticula were found in the sigmoid colon and       descending colon.      The exam was otherwise without abnormality on direct and retroflexion       views. Impression:               - Diverticulosis in the sigmoid colon and in the                            descending colon.                           - The examination was otherwise normal on direct                            and retroflexion views.                           - No specimens collected. Moderate Sedation:      Moderate (conscious) sedation was personally administered by an       anesthesia professional. The following parameters were monitored: oxygen       saturation, heart rate, blood pressure, and response to care. Recommendation:           - Patient has a contact number available for                            emergencies. The signs and symptoms of potential                            delayed complications were discussed with the  patient. Return to normal activities tomorrow.                            Written discharge instructions were provided to the                            patient.                           - Resume previous diet.                           - Continue present medications.                           - Repeat colonoscopy in 10 years for screening                            purposes.                           - Return to GI office (date not yet determined). Procedure Code(s):        --- Professional ---                           [phone], Colonoscopy, flexible; diagnostic, including                             collection of specimen(s) by brushing or washing,                            when performed (separate procedure) Diagnosis Code(s):        --- Professional ---                           Z12.11, Encounter for screening for malignant                            neoplasm of colon                           K57.30, Diverticulosis of large intestine without                            perforation or abscess without bleeding CPT copyright 2019 American Medical Association. All rights reserved. The codes documented in this report are preliminary and upon coder review may  be revised to meet current compliance requirements. Gerrit Friends. Clarabel Marion, MD Gennette Pac, MD 08/28/2020 8:51:41 AM This report has been signed electronically. Number of Addenda: 0

## 2020-08-28 NOTE — Discharge Instructions (Signed)
Colonoscopy Discharge Instructions  Read the instructions outlined below and refer to this sheet in the next few weeks. These discharge instructions provide you with general information on caring for yourself after you leave the hospital. Your doctor may also give you specific instructions. While your treatment has been planned according to the most current medical practices available, unavoidable complications occasionally occur. If you have any problems or questions after discharge, call Dr. Jena Gauss at [phone]. ACTIVITY  You may resume your regular activity, but move at a slower pace for the next 24 hours.   Take frequent rest periods for the next 24 hours.   Walking will help get rid of the air and reduce the bloated feeling in your belly (abdomen).   No driving for 24 hours (because of the medicine (anesthesia) used during the test).    Do not sign any important legal documents or operate any machinery for 24 hours (because of the anesthesia used during the test).  NUTRITION  Drink plenty of fluids.   You may resume your normal diet as instructed by your doctor.   Begin with a light meal and progress to your normal diet. Heavy or fried foods are harder to digest and may make you feel sick to your stomach (nauseated).   Avoid alcoholic beverages for 24 hours or as instructed.  MEDICATIONS  You may resume your normal medications unless your doctor tells you otherwise.  WHAT YOU CAN EXPECT TODAY  Some feelings of bloating in the abdomen.   Passage of more gas than usual.   Spotting of blood in your stool or on the toilet paper.  IF YOU HAD POLYPS REMOVED DURING THE COLONOSCOPY:  No aspirin products for 7 days or as instructed.   No alcohol for 7 days or as instructed.   Eat a soft diet for the next 24 hours.  FINDING OUT THE RESULTS OF YOUR TEST Not all test results are available during your visit. If your test results are not back during the visit, make an appointment  with your caregiver to find out the results. Do not assume everything is normal if you have not heard from your caregiver or the medical facility. It is important for you to follow up on all of your test results.  SEEK IMMEDIATE MEDICAL ATTENTION IF:  You have more than a spotting of blood in your stool.   Your belly is swollen (abdominal distention).   You are nauseated or vomiting.   You have a temperature over 101.   You have abdominal pain or discomfort that is severe or gets worse throughout the day.    Diverticulosis found today  Information on diverticulosis provided  No polyps found today.  I recommend 1 more colonoscopy in 10 years  At patient request, called [facility] [phone].  Rolled to voicemail.  Left a message.   Diverticulosis  Diverticulosis is a condition that develops when small pouches (diverticula) form in the wall of the large intestine (colon). The colon is where water is absorbed and stool (feces) is formed. The pouches form when the inside layer of the colon pushes through weak spots in the outer layers of the colon. You may have a few pouches or many of them. The pouches usually do not cause problems unless they become inflamed or infected. When this happens, the condition is called diverticulitis. What are the causes? The cause of this condition is not known. What increases the risk? The following factors may make you more likely to  develop this condition:  Being older than age 90. Your risk for this condition increases with age. Diverticulosis is rare among people younger than age 27. By age 44, many people have it.  Eating a low-fiber diet.  Having frequent constipation.  Being overweight.  Not getting enough exercise.  Smoking.  Taking over-the-counter pain medicines, like aspirin and ibuprofen.  Having a family history of diverticulosis. What are the signs or symptoms? In most people, there are no symptoms of this condition. If you  do have symptoms, they may include:  Bloating.  Cramps in the abdomen.  Constipation or diarrhea.  Pain in the lower left side of the abdomen. How is this diagnosed? Because diverticulosis usually has no symptoms, it is most often diagnosed during an exam for other colon problems. The condition may be diagnosed by:  Using a flexible scope to examine the colon (colonoscopy).  Taking an X-ray of the colon after dye has been put into the colon (barium enema).  Having a CT scan. How is this treated? You may not need treatment for this condition. Your health care provider may recommend treatment to prevent problems. You may need treatment if you have symptoms or if you previously had diverticulitis. Treatment may include:  Eating a high-fiber diet.  Taking a fiber supplement.  Taking a live bacteria supplement (probiotic).  Taking medicine to relax your colon.   Follow these instructions at home: Medicines  Take over-the-counter and prescription medicines only as told by your health care provider.  If told by your health care provider, take a fiber supplement or probiotic. Constipation prevention Your condition may cause constipation. To prevent or treat constipation, you may need to:  Drink enough fluid to keep your urine pale yellow.  Take over-the-counter or prescription medicines.  Eat foods that are high in fiber, such as beans, whole grains, and fresh fruits and vegetables.  Limit foods that are high in fat and processed sugars, such as fried or sweet foods.   General instructions  Try not to strain when you have a bowel movement.  Keep all follow-up visits as told by your health care provider. This is important. Contact a health care provider if you:  Have pain in your abdomen.  Have bloating.  Have cramps.  Have not had a bowel movement in 3 days. Get help right away if:  Your pain gets worse.  Your bloating becomes very bad.  You have a fever or  chills, and your symptoms suddenly get worse.  You vomit.  You have bowel movements that are bloody or black.  You have bleeding from your rectum. Summary  Diverticulosis is a condition that develops when small pouches (diverticula) form in the wall of the large intestine (colon).  You may have a few pouches or many of them.  This condition is most often diagnosed during an exam for other colon problems.  Treatment may include increasing the fiber in your diet, taking supplements, or taking medicines. This information is not intended to replace advice given to you by your health care provider. Make sure you discuss any questions you have with your health care provider. Document Revised: 10/12/2018 Document Reviewed: 10/12/2018 Elsevier Patient Education  2021 [employer].

## 2020-08-28 NOTE — Anesthesia Procedure Notes (Signed)
Date/Time: 08/28/2020 8:30 AM Performed by: Julian Reil, CRNA Pre-anesthesia Checklist: Patient identified, Emergency Drugs available, Suction available and Patient being monitored Patient Re-evaluated:Patient Re-evaluated prior to induction Oxygen Delivery Method: Nasal cannula Induction Type: IV induction Placement Confirmation: positive ETCO2

## 2020-08-28 NOTE — Anesthesia Postprocedure Evaluation (Signed)
Anesthesia Post Note  Patient: Carol Maldonado  Procedure(s) Performed: COLONOSCOPY WITH PROPOFOL (N/A )  Patient location during evaluation: Phase II Anesthesia Type: General Level of consciousness: awake Pain management: pain level controlled Vital Signs Assessment: post-procedure vital signs reviewed and stable Respiratory status: spontaneous breathing and respiratory function stable Cardiovascular status: blood pressure returned to baseline and stable Postop Assessment: no headache and no apparent nausea or vomiting Anesthetic complications: no Comments: Late entry   No complications documented.   Last Vitals:  Vitals:   08/28/20 0721 08/28/20 0844  BP: 134/82 97/60  Pulse: 68   Resp: 17 17  Temp: 36.9 C 36.5 C  SpO2: 99% 98%    Last Pain:  Vitals:   08/28/20 0844  TempSrc: Oral  PainSc: 0-No pain                 Windell Norfolk

## 2020-08-28 NOTE — Transfer of Care (Signed)
Immediate Anesthesia Transfer of Care Note  Patient: Carol Maldonado  Procedure(s) Performed: COLONOSCOPY WITH PROPOFOL (N/A )  Patient Location: Endoscopy Unit  Anesthesia Type:General  Level of Consciousness: drowsy  Airway & Oxygen Therapy: Patient Spontanous Breathing  Post-op Assessment: Report given to RN and Post -op Vital signs reviewed and stable  Post vital signs: Reviewed and stable  Last Vitals:  Vitals Value Taken Time  BP 97/60 08/28/20 0844  Temp    Pulse    Resp 17 08/28/20 0844  SpO2 98 % 08/28/20 0844    Last Pain:  Vitals:   08/28/20 0844  TempSrc: Oral  PainSc: 0-No pain         Complications: No complications documented.

## 2020-08-28 NOTE — H&P (Signed)
_0 @   Primary Care Physician:  Noreene Larsson, NP Primary Gastroenterologist:  Dr. Gala Romney  Pre-Procedure History & Physical: HPI:  Carol Maldonado is a 60 y.o. female is here for a screening colonoscopy.  Negative colonoscopy 10 years ago.  No bowel symptoms.  Family history of CRC, polyps. Past Medical History:  Diagnosis Date  . Allergy    Phreesia 02/29/2020  . Anxiety    Phreesia 02/29/2020  . Hyperlipidemia    Phreesia 02/29/2020  . Hypertension    Phreesia 02/29/2020  . Insomnia   . Osteoporosis    Phreesia 02/29/2020  . Seasonal allergies     Past Surgical History:  Procedure Laterality Date  . COLONOSCOPY  03/27/2011   Dr. Gala Romney; entirely normal exam and recommended repeat in 10 years.    Prior to Admission medications   Medication Sig Start Date End Date Taking? Authorizing Provider  alendronate (FOSAMAX) 70 MG tablet Take 1 tablet (70 mg total) by mouth every 7 (seven) days. Take with a full glass of water on an empty stomach. 06/23/20  Yes Noreene Larsson, NP  ALPRAZolam Duanne Moron) 0.5 MG tablet Take 1 tablet (0.5 mg total) by mouth at bedtime. 06/23/20  Yes Noreene Larsson, NP  amLODipine-olmesartan (AZOR) 10-40 MG tablet Take 1 tablet by mouth daily. 08/05/20  Yes Noreene Larsson, NP  azelastine (ASTELIN) 0.1 % nasal spray Place 1 spray into both nostrils 2 (two) times daily. Use in each nostril as directed 04/07/20  Yes Noreene Larsson, NP  Calcium Carb-Cholecalciferol (CALCIUM 600/VITAMIN D3 PO) Take 1 tablet by mouth daily.   Yes [provider]  cholecalciferol (VITAMIN D3) 25 MCG (1000 UNIT) tablet Take 1,000 Units by mouth daily.   Yes [provider]  desloratadine (CLARINEX) 5 MG tablet Take 1 tablet (5 mg total) by mouth daily. 06/23/20  Yes Noreene Larsson, NP  flunisolide (NASALIDE) 25 MCG/ACT (0.025%) SOLN Place 2 sprays into the nose 2 (two) times daily.   Yes [provider]  ibuprofen (ADVIL,MOTRIN) 200 MG tablet Take 400 mg by  mouth every 8 (eight) hours as needed for moderate pain.   Yes [provider]  Na Sulfate-K Sulfate-Mg Sulf (SUPREP BOWEL PREP KIT) 17.5-3.13-1.6 GM/177ML SOLN Take 1 kit by mouth as directed. 07/14/20  Yes Addison Whidbee, Cristopher Estimable, MD  Omega-3 Fatty Acids (FISH OIL) 1000 MG CAPS Take 1,000 mg by mouth daily.   Yes [provider]  RABEprazole (ACIPHEX) 20 MG tablet Take 20 mg by mouth daily. 08/01/20  Yes [provider]  rosuvastatin (CRESTOR) 20 MG tablet Take 20 mg by mouth at bedtime. 08/01/20  Yes [provider]  sertraline (ZOLOFT) 50 MG tablet Take 1 tablet (50 mg total) by mouth at bedtime. 06/23/20  Yes Noreene Larsson, NP  albuterol (VENTOLIN HFA) 108 (90 Base) MCG/ACT inhaler Inhale 2 puffs into the lungs as needed. Patient taking differently: Inhale 2 puffs into the lungs every 6 (six) hours as needed for wheezing or shortness of breath. 06/23/20   Noreene Larsson, NP    Allergies as of 07/14/2020 - Review Complete 06/23/2020  Allergen Reaction Noted  . Amoxicillin Hives, Itching, and Rash 02/29/2020  . Codeine Hives, Itching, and Rash 02/29/2020  . Elemental sulfur Rash 02/25/2015  . Hydrocodone Rash 02/25/2015  . Keflex [cephalexin] Rash 02/25/2015  . Penicillins Rash 02/25/2015    Family History  Problem Relation Age of Onset  . Hypertension Mother   . Heart attack Mother   .  Pneumonia Maternal Grandmother   . Other Maternal Grandfather        MVA  . Hypertension Father   . High Cholesterol Father   . Irritable bowel syndrome Sister   . Irritable bowel syndrome Sister   . Colon cancer Neg Hx     Social History   Socioeconomic History  . Marital status: Single    Spouse name: Not on file  . Number of children: Not on file  . Years of education: Not on file  . Highest education level: Not on file  Occupational History  . Not on file  Tobacco Use  . Smoking status: Never Smoker  . Smokeless tobacco: Never Used  Vaping Use  . Vaping Use:  Never used  Substance and Sexual Activity  . Alcohol use: Never    Alcohol/week: 0.0 standard drinks  . Drug use: Never  . Sexual activity: Not Currently    Birth control/protection: Post-menopausal  Other Topics Concern  . Not on file  Social History Narrative  . Not on file   Social Determinants of Health   Financial Resource Strain: Not on file  Food Insecurity: Not on file  Transportation Needs: Not on file  Physical Activity: Not on file  Stress: Not on file  Social Connections: Not on file  Intimate Partner Violence: Not on file    Review of Systems: See HPI, otherwise negative ROS  Physical Exam: BP 134/82   Pulse 68   Temp 98.4 F (36.9 C) (Oral)   Resp 17   Ht _0  (1.549 m)   Wt 62.6 kg   SpO2 99%   BMI 26.07 kg/m  General:   Alert,  Well-developed, well-nourished, pleasant and cooperative in NAD Lungs:  Clear throughout to auscultation.   No wheezes, crackles, or rhonchi. No acute distress. Heart:  Regular rate and rhythm; no murmurs, clicks, rubs,  or gallops. Abdomen:  Soft, nontender and nondistended. No masses, hepatosplenomegaly or hernias noted. Normal bowel sounds, without guarding, and without rebound.   Impression/Plan: Carol Maldonado is now here to undergo a screening colonoscopy.  Risks, benefits, limitations, imponderables and alternatives regarding colonoscopy have been reviewed with the patient. Questions have been answered. All parties agreeable.     Notice:  This dictation was prepared with Dragon dictation along with smaller phrase technology. Any transcriptional errors that result from this process are unintentional and may not be corrected upon review.

## 2020-08-28 NOTE — Anesthesia Preprocedure Evaluation (Signed)
Anesthesia Evaluation  Patient identified by MRN, date of birth, ID band Patient awake    Reviewed: Allergy & Precautions, H&P , NPO status , Patient's Chart, lab work & pertinent test results, reviewed documented beta blocker date and time   Airway Mallampati: II  TM Distance: >3 FB Neck ROM: full    Dental no notable dental hx.    Pulmonary neg pulmonary ROS,    Pulmonary exam normal breath sounds clear to auscultation       Cardiovascular Exercise Tolerance: Good hypertension, negative cardio ROS   Rhythm:regular Rate:Normal     Neuro/Psych PSYCHIATRIC DISORDERS Anxiety negative neurological ROS     GI/Hepatic negative GI ROS, Neg liver ROS,   Endo/Other  negative endocrine ROS  Renal/GU CRFRenal disease  negative genitourinary   Musculoskeletal   Abdominal   Peds  Hematology negative hematology ROS (+)   Anesthesia Other Findings   Reproductive/Obstetrics negative OB ROS                             Anesthesia Physical Anesthesia Plan  ASA: II  Anesthesia Plan: General   Post-op Pain Management:    Induction:   PONV Risk Score and Plan: Propofol infusion  Airway Management Planned:   Additional Equipment:   Intra-op Plan:   Post-operative Plan:   Informed Consent: I have reviewed the patients History and Physical, chart, labs and discussed the procedure including the risks, benefits and alternatives for the proposed anesthesia with the patient or authorized representative who has indicated his/her understanding and acceptance.     Dental Advisory Given  Plan Discussed with: CRNA  Anesthesia Plan Comments:         Anesthesia Quick Evaluation

## 2020-09-08 ENCOUNTER — Encounter (HOSPITAL_COMMUNITY): Payer: Self-pay | Admitting: Internal Medicine

## 2020-09-16 ENCOUNTER — Other Ambulatory Visit: Payer: Self-pay

## 2020-09-16 ENCOUNTER — Telehealth: Payer: Self-pay

## 2020-09-16 ENCOUNTER — Other Ambulatory Visit: Payer: Self-pay | Admitting: Nurse Practitioner

## 2020-09-16 DIAGNOSIS — K219 Gastro-esophageal reflux disease without esophagitis: Secondary | ICD-10-CM

## 2020-09-16 MED ORDER — RABEPRAZOLE SODIUM 20 MG PO TBEC: 20.0000 mg | DELAYED_RELEASE_TABLET | Freq: Every day | ORAL | 1 refills | Status: DC

## 2020-09-16 NOTE — Telephone Encounter (Signed)
Pt is requesting Rabeprazole Sod DR 20mg  tabs refilled. You have not refilled this for pt before. Ok to send in?

## 2020-09-16 NOTE — Telephone Encounter (Signed)
I sent it  

## 2020-10-08 ENCOUNTER — Other Ambulatory Visit: Payer: Self-pay | Admitting: *Deleted

## 2020-10-08 ENCOUNTER — Telehealth: Payer: Self-pay

## 2020-10-08 DIAGNOSIS — G4700 Insomnia, unspecified: Secondary | ICD-10-CM

## 2020-10-08 DIAGNOSIS — J302 Other seasonal allergic rhinitis: Secondary | ICD-10-CM

## 2020-10-08 DIAGNOSIS — K219 Gastro-esophageal reflux disease without esophagitis: Secondary | ICD-10-CM

## 2020-10-08 MED ORDER — RABEPRAZOLE SODIUM 20 MG PO TBEC: 20.0000 mg | DELAYED_RELEASE_TABLET | Freq: Every day | ORAL | 1 refills | Status: DC

## 2020-10-08 MED ORDER — ALENDRONATE SODIUM 70 MG PO TABS: 70.0000 mg | ORAL_TABLET | ORAL | 11 refills | Status: DC

## 2020-10-08 MED ORDER — DESLORATADINE 5 MG PO TABS: 5.0000 mg | ORAL_TABLET | Freq: Every day | ORAL | 1 refills | Status: DC

## 2020-10-08 MED ORDER — SERTRALINE HCL 50 MG PO TABS: 50.0000 mg | ORAL_TABLET | Freq: Every day | ORAL | 1 refills | Status: DC

## 2020-10-08 NOTE — Telephone Encounter (Signed)
Patient called need med refills sent to [facility].  RABEprazole (ACIPHEX) 20 MG tablet  ALPRAZolam (XANAX) 0.5 MG tablet   desloratadine (CLARINEX) 5 MG tablet  sertraline (ZOLOFT) 50 MG tablet  alendronate (FOSAMAX) 70 MG tablet

## 2020-10-08 NOTE — Telephone Encounter (Signed)
All medications sent to pharmacy besides xanax. Will you please send in refill if you would like for her to have refill

## 2020-10-10 ENCOUNTER — Other Ambulatory Visit: Payer: Self-pay | Admitting: Nurse Practitioner

## 2020-10-10 MED ORDER — ALPRAZOLAM 0.5 MG PO TABS: 0.5000 mg | ORAL_TABLET | Freq: Every day | ORAL | 2 refills | Status: DC

## 2020-10-10 NOTE — Telephone Encounter (Signed)
Sent xanax

## 2020-10-23 ENCOUNTER — Ambulatory Visit: Payer: 59 | Admitting: Nurse Practitioner

## 2020-11-10 ENCOUNTER — Other Ambulatory Visit: Payer: Self-pay

## 2020-11-10 DIAGNOSIS — Z13220 Encounter for screening for lipoid disorders: Secondary | ICD-10-CM

## 2020-11-10 DIAGNOSIS — N1831 Chronic kidney disease, stage 3a: Secondary | ICD-10-CM

## 2020-11-10 DIAGNOSIS — E1165 Type 2 diabetes mellitus with hyperglycemia: Secondary | ICD-10-CM

## 2020-11-10 DIAGNOSIS — I10 Essential (primary) hypertension: Secondary | ICD-10-CM

## 2020-11-11 LAB — CBC WITH DIFFERENTIAL
Basophils Absolute: 0 10*3/uL (ref 0.0–0.2)
Basos: 1 %
EOS (ABSOLUTE): 0.1 10*3/uL (ref 0.0–0.4)
Eos: 2 %
Hematocrit: 34.7 % (ref 34.0–46.6)
Hemoglobin: 10.9 g/dL — ABNORMAL LOW (ref 11.1–15.9)
Immature Grans (Abs): 0 10*3/uL (ref 0.0–0.1)
Immature Granulocytes: 0 %
Lymphocytes Absolute: 1.3 10*3/uL (ref 0.7–3.1)
Lymphs: 21 %
MCH: 26.6 pg (ref 26.6–33.0)
MCHC: 31.4 g/dL — ABNORMAL LOW (ref 31.5–35.7)
MCV: 85 fL (ref 79–97)
Monocytes Absolute: 0.5 10*3/uL (ref 0.1–0.9)
Monocytes: 7 %
Neutrophils Absolute: 4.6 10*3/uL (ref 1.4–7.0)
Neutrophils: 69 %
RBC: 4.1 x10E6/uL (ref 3.77–5.28)
RDW: 13.9 % (ref 11.7–15.4)
WBC: 6.5 10*3/uL (ref 3.4–10.8)

## 2020-11-11 LAB — CMP14+EGFR
ALT: 9 IU/L (ref 0–32)
AST: 21 IU/L (ref 0–40)
Albumin/Globulin Ratio: 2 (ref 1.2–2.2)
Albumin: 4.9 g/dL — ABNORMAL HIGH (ref 3.8–4.8)
Alkaline Phosphatase: 70 IU/L (ref 44–121)
BUN/Creatinine Ratio: 12 (ref 12–28)
BUN: 18 mg/dL (ref 8–27)
Bilirubin Total: 0.3 mg/dL (ref 0.0–1.2)
CO2: 21 mmol/L (ref 20–29)
Calcium: 9.6 mg/dL (ref 8.7–10.3)
Chloride: 106 mmol/L (ref 96–106)
Creatinine, Ser: 1.45 mg/dL — ABNORMAL HIGH (ref 0.57–1.00)
Globulin, Total: 2.5 g/dL (ref 1.5–4.5)
Glucose: 97 mg/dL (ref 65–99)
Potassium: 5.6 mmol/L — ABNORMAL HIGH (ref 3.5–5.2)
Sodium: 140 mmol/L (ref 134–144)
Total Protein: 7.4 g/dL (ref 6.0–8.5)
eGFR: 41 mL/min/{1.73_m2} — ABNORMAL LOW (ref >59)

## 2020-11-11 LAB — LIPID PANEL
Chol/HDL Ratio: 2.1 ratio (ref 0.0–4.4)
Cholesterol, Total: 140 mg/dL (ref 100–199)
HDL: 67 mg/dL (ref >39)
LDL Chol Calc (NIH): 58 mg/dL (ref 0–99)
Triglycerides: 75 mg/dL (ref 0–149)
VLDL Cholesterol Cal: 15 mg/dL (ref 5–40)

## 2020-11-11 NOTE — Progress Notes (Signed)
Kidney function is a little lower. We will discuss this tomorrow.

## 2020-11-12 ENCOUNTER — Ambulatory Visit: Payer: 59 | Admitting: Nurse Practitioner

## 2020-11-12 ENCOUNTER — Other Ambulatory Visit: Payer: Self-pay

## 2020-11-12 ENCOUNTER — Encounter: Payer: Self-pay | Admitting: Nurse Practitioner

## 2020-11-12 VITALS — BP 128/81 | HR 92 | Temp 98.8°F | Ht 61.0 in | Wt 144.0 lb

## 2020-11-12 DIAGNOSIS — N1832 Chronic kidney disease, stage 3b: Secondary | ICD-10-CM

## 2020-11-12 DIAGNOSIS — D649 Anemia, unspecified: Secondary | ICD-10-CM | POA: Diagnosis not present

## 2020-11-12 DIAGNOSIS — I10 Essential (primary) hypertension: Secondary | ICD-10-CM

## 2020-11-12 DIAGNOSIS — E875 Hyperkalemia: Secondary | ICD-10-CM | POA: Diagnosis not present

## 2020-11-12 MED ORDER — AMLODIPINE-OLMESARTAN 10-20 MG PO TABS: 1.0000 | ORAL_TABLET | Freq: Every day | ORAL | 1 refills | Status: DC

## 2020-11-12 NOTE — Assessment & Plan Note (Signed)
BP Readings from Last 3 Encounters:  11/12/20 128/81  08/28/20 97/60  08/05/20 (!) 165/90   -DECREASE amlodipine/olmesartan to 10/20 from 10/40 d/t hyperkalemia likely from olmesartan -recheck K in 2 weeks

## 2020-11-12 NOTE — Assessment & Plan Note (Signed)
-  possibly related to CKD -will monitor with routine labs -she will schedule nephrology appt

## 2020-11-12 NOTE — Assessment & Plan Note (Signed)
Lab Results  Component Value Date   CREATININE 1.45 (H) 11/10/2020   BUN 18 11/10/2020   NA 140 11/10/2020   K 5.6 (H) 11/10/2020   CL 106 11/10/2020   CO2 21 11/10/2020   -decrease olmesartan

## 2020-11-12 NOTE — Patient Instructions (Signed)
Please have labs drawn 1-2 days prior to your appointment so we can discuss the results during your office visit.

## 2020-11-12 NOTE — Progress Notes (Signed)
Acute Office Visit  Subjective:    Patient ID: Carol Maldonado, female    DOB: 11-07-1960, 60 y.o.   MRN: [id number]  Chief Complaint  Patient presents with   Follow-up    labs    HPI Patient is in today for follow-up for HTN and CKD. She states her home BP has been running well with SBP in the 110-120s consistently. She states she feels more tired than normal after increasing the dosage.  Past Medical History:  Diagnosis Date   Allergy    Phreesia 02/29/2020   Anxiety    Phreesia 02/29/2020   Hyperlipidemia    Phreesia 02/29/2020   Hypertension    Phreesia 02/29/2020   Insomnia    Osteoporosis    Phreesia 02/29/2020   Seasonal allergies     Past Surgical History:  Procedure Laterality Date   COLONOSCOPY  03/27/2011   Dr. Gala Romney; entirely normal exam and recommended repeat in 10 years.   COLONOSCOPY WITH PROPOFOL N/A 08/28/2020   Procedure: COLONOSCOPY WITH PROPOFOL;  Surgeon: Daneil Dolin, MD;  Location: [facility];  Service: Endoscopy;  Laterality: N/A;  8:30am    Family History  Problem Relation Age of Onset   Hypertension Mother    Heart attack Mother    Pneumonia Maternal Grandmother    Other Maternal Grandfather        MVA   Hypertension Father    High Cholesterol Father    Irritable bowel syndrome Sister    Irritable bowel syndrome Sister    Colon cancer Neg Hx     Social History   Socioeconomic History   Marital status: Single    Spouse name: Not on file   Number of children: Not on file   Years of education: Not on file   Highest education level: Not on file  Occupational History   Not on file  Tobacco Use   Smoking status: Never   Smokeless tobacco: Never  Vaping Use   Vaping Use: Never used  Substance and Sexual Activity   Alcohol use: Never    Alcohol/week: 0.0 standard drinks   Drug use: Never   Sexual activity: Not Currently    Birth control/protection: Post-menopausal  Other Topics Concern   Not on file  Social History  Narrative   Not on file   Social Determinants of Health   Financial Resource Strain: Not on file  Food Insecurity: Not on file  Transportation Needs: Not on file  Physical Activity: Not on file  Stress: Not on file  Social Connections: Not on file  Intimate Partner Violence: Not on file    Outpatient Medications Prior to Visit  Medication Sig Dispense Refill   albuterol (VENTOLIN HFA) 108 (90 Base) MCG/ACT inhaler Inhale 2 puffs into the lungs as needed. (Patient taking differently: Inhale 2 puffs into the lungs every 6 (six) hours as needed for wheezing or shortness of breath.) 6.7 g 3   alendronate (FOSAMAX) 70 MG tablet Take 1 tablet (70 mg total) by mouth every 7 (seven) days. Take with a full glass of water on an empty stomach. 4 tablet 11   ALPRAZolam (XANAX) 0.5 MG tablet Take 1 tablet (0.5 mg total) by mouth at bedtime. 30 tablet 2   azelastine (ASTELIN) 0.1 % nasal spray Place 1 spray into both nostrils 2 (two) times daily. Use in each nostril as directed 30 mL 1   Calcium Carb-Cholecalciferol (CALCIUM 600/VITAMIN D3 PO) Take 1 tablet by mouth daily.  cholecalciferol (VITAMIN D3) 25 MCG (1000 UNIT) tablet Take 1,000 Units by mouth daily.     desloratadine (CLARINEX) 5 MG tablet Take 1 tablet (5 mg total) by mouth daily. 30 tablet 1   flunisolide (NASALIDE) 25 MCG/ACT (0.025%) SOLN Place 2 sprays into the nose 2 (two) times daily.     ibuprofen (ADVIL,MOTRIN) 200 MG tablet Take 400 mg by mouth every 8 (eight) hours as needed for moderate pain.     Omega-3 Fatty Acids (FISH OIL) 1000 MG CAPS Take 1,000 mg by mouth daily.     RABEprazole (ACIPHEX) 20 MG tablet Take 1 tablet (20 mg total) by mouth daily. 90 tablet 1   rosuvastatin (CRESTOR) 20 MG tablet Take 20 mg by mouth at bedtime.     sertraline (ZOLOFT) 50 MG tablet Take 1 tablet (50 mg total) by mouth at bedtime. 30 tablet 1   amLODipine-olmesartan (AZOR) 10-40 MG tablet Take 1 tablet by mouth daily. 90 tablet 3   Na  Sulfate-K Sulfate-Mg Sulf (SUPREP BOWEL PREP KIT) 17.5-3.13-1.6 GM/177ML SOLN Take 1 kit by mouth as directed. (Patient not taking: Reported on 11/12/2020) 354 mL 0   No facility-administered medications prior to visit.    Allergies  Allergen Reactions   Amoxicillin Hives, Itching and Rash   Codeine Hives, Itching and Rash   Hydrocodone Rash   Keflex [Cephalexin] Rash   Penicillins Rash   Sulfa Antibiotics Rash    Review of Systems  Constitutional: Negative.   Respiratory: Negative.    Cardiovascular: Negative.   Musculoskeletal: Negative.   Psychiatric/Behavioral: Negative.        Objective:    Physical Exam Constitutional:      Appearance: Normal appearance.  Cardiovascular:     Rate and Rhythm: Normal rate and regular rhythm.     Pulses: Normal pulses.     Heart sounds: Normal heart sounds.  Pulmonary:     Effort: Pulmonary effort is normal.     Breath sounds: Normal breath sounds.  Musculoskeletal:        General: Normal range of motion.  Neurological:     Mental Status: She is alert.  Psychiatric:        Mood and Affect: Mood normal.        Behavior: Behavior normal.        Thought Content: Thought content normal.        Judgment: Judgment normal.    BP 128/81 (BP Location: Left Arm, Patient Position: Sitting, Cuff Size: Normal)   Pulse 92   Temp 98.8 F (37.1 C) (Oral)   Ht _0  (1.549 m)   Wt 144 lb (65.3 kg)   SpO2 96%   BMI 27.21 kg/m  Wt Readings from Last 3 Encounters:  11/12/20 144 lb (65.3 kg)  08/28/20 138 lb (62.6 kg)  08/05/20 135 lb (61.2 kg)    Health Maintenance Due  Topic Date Due   HIV Screening  Never done   Hepatitis C Screening  Never done   Zoster Vaccines- Shingrix (1 of 2) Never done   INFLUENZA VACCINE  10/27/2020    There are no preventive care reminders to display for this patient.   No results found for: TSH Lab Results  Component Value Date   WBC 6.5 11/10/2020   HGB 10.9 (L) 11/10/2020   HCT 34.7 11/10/2020    MCV 85 11/10/2020   PLT 203 02/28/2020   Lab Results  Component Value Date   NA 140 11/10/2020   K 5.6 (  H) 11/10/2020   CO2 21 11/10/2020   GLUCOSE 97 11/10/2020   BUN 18 11/10/2020   CREATININE 1.45 (H) 11/10/2020   BILITOT 0.3 11/10/2020   ALKPHOS 70 11/10/2020   AST 21 11/10/2020   ALT 9 11/10/2020   PROT 7.4 11/10/2020   ALBUMIN 4.9 (H) 11/10/2020   CALCIUM 9.6 11/10/2020   EGFR 41 (L) 11/10/2020   Lab Results  Component Value Date   CHOL 140 11/10/2020   Lab Results  Component Value Date   HDL 67 11/10/2020   Lab Results  Component Value Date   LDLCALC 58 11/10/2020   Lab Results  Component Value Date   TRIG 75 11/10/2020   Lab Results  Component Value Date   CHOLHDL 2.1 11/10/2020   Lab Results  Component Value Date   HGBA1C 5.8 (H) 02/28/2020       Assessment & Plan:   Problem List Items Addressed This Visit       Cardiovascular and Mediastinum   Essential hypertension, benign    BP Readings from Last 3 Encounters:  11/12/20 128/81  08/28/20 97/60  08/05/20 (!) 165/90  -DECREASE amlodipine/olmesartan to 10/20 from 10/40 d/t hyperkalemia likely from olmesartan -recheck K in 2 weeks       Relevant Medications   amlodipine-olmesartan (AZOR) 10-20 MG tablet     Genitourinary   Stage 3b chronic kidney disease ([place]) - Primary    -referral to nephrology -she was unable to get off of work when referred previously      Relevant Orders   Ambulatory referral to Nephrology     Other   Hyperkalemia    Lab Results  Component Value Date   CREATININE 1.45 (H) 11/10/2020   BUN 18 11/10/2020   NA 140 11/10/2020   K 5.6 (H) 11/10/2020   CL 106 11/10/2020   CO2 21 11/10/2020  -decrease olmesartan      Relevant Orders   Basic metabolic panel   Anemia    -possibly related to CKD -will monitor with routine labs -she will schedule nephrology appt        Meds ordered this encounter  Medications   amlodipine-olmesartan (AZOR) 10-20  MG tablet    Sig: Take 1 tablet by mouth daily.    Dispense:  30 tablet    Refill:  [place], NP

## 2020-11-12 NOTE — Assessment & Plan Note (Signed)
-  referral to nephrology -she was unable to get off of work when referred previously

## 2020-11-26 LAB — BASIC METABOLIC PANEL
BUN/Creatinine Ratio: 12 (ref 12–28)
BUN: 16 mg/dL (ref 8–27)
CO2: 21 mmol/L (ref 20–29)
Calcium: 9.4 mg/dL (ref 8.7–10.3)
Chloride: 101 mmol/L (ref 96–106)
Creatinine, Ser: 1.32 mg/dL — ABNORMAL HIGH (ref 0.57–1.00)
Glucose: 87 mg/dL (ref 65–99)
Potassium: 5.3 mmol/L — ABNORMAL HIGH (ref 3.5–5.2)
Sodium: 136 mmol/L (ref 134–144)
eGFR: 46 mL/min/{1.73_m2} — ABNORMAL LOW (ref >59)

## 2020-11-26 NOTE — Progress Notes (Signed)
Potassium is a little lower and kidney function is a little better.

## 2020-11-28 ENCOUNTER — Ambulatory Visit: Payer: 59 | Admitting: Nurse Practitioner

## 2020-12-08 ENCOUNTER — Other Ambulatory Visit: Payer: Self-pay

## 2020-12-08 DIAGNOSIS — G4700 Insomnia, unspecified: Secondary | ICD-10-CM

## 2020-12-08 DIAGNOSIS — J302 Other seasonal allergic rhinitis: Secondary | ICD-10-CM

## 2020-12-08 MED ORDER — SERTRALINE HCL 50 MG PO TABS: 50.0000 mg | ORAL_TABLET | Freq: Every day | ORAL | 1 refills | Status: DC

## 2020-12-08 MED ORDER — DESLORATADINE 5 MG PO TABS: 5.0000 mg | ORAL_TABLET | Freq: Every day | ORAL | 1 refills | Status: DC

## 2021-01-14 ENCOUNTER — Telehealth: Payer: Self-pay | Admitting: Nurse Practitioner

## 2021-01-14 ENCOUNTER — Other Ambulatory Visit: Payer: Self-pay

## 2021-01-14 DIAGNOSIS — I10 Essential (primary) hypertension: Secondary | ICD-10-CM

## 2021-01-14 MED ORDER — AMLODIPINE-OLMESARTAN 10-20 MG PO TABS: 1.0000 | ORAL_TABLET | Freq: Every day | ORAL | 1 refills | Status: DC

## 2021-01-14 MED ORDER — ALPRAZOLAM 0.5 MG PO TABS: 0.5000 mg | ORAL_TABLET | Freq: Every day | ORAL | 0 refills | Status: DC

## 2021-01-14 MED ORDER — ALPRAZOLAM 0.5 MG PO TABS: 0.5000 mg | ORAL_TABLET | Freq: Every day | ORAL | 2 refills | Status: DC

## 2021-01-14 MED ORDER — ALENDRONATE SODIUM 70 MG PO TABS: 70.0000 mg | ORAL_TABLET | ORAL | 11 refills | Status: DC

## 2021-01-14 NOTE — Telephone Encounter (Signed)
Pt aware that refills have been sent

## 2021-01-14 NOTE — Telephone Encounter (Signed)
Pt called in for refills  Amlodepine, Alprazolem, and Alendronate  [facility]  [place] also need verification to fill

## 2021-01-21 ENCOUNTER — Other Ambulatory Visit: Payer: Self-pay | Admitting: Family Medicine

## 2021-01-21 MED ORDER — ALPRAZOLAM 0.5 MG PO TABS: 0.5000 mg | ORAL_TABLET | Freq: Every day | ORAL | 0 refills | Status: DC

## 2021-02-12 ENCOUNTER — Other Ambulatory Visit: Payer: Self-pay | Admitting: Family Medicine

## 2021-02-12 ENCOUNTER — Other Ambulatory Visit: Payer: Self-pay | Admitting: Nurse Practitioner

## 2021-02-12 DIAGNOSIS — G4700 Insomnia, unspecified: Secondary | ICD-10-CM

## 2021-02-12 DIAGNOSIS — J302 Other seasonal allergic rhinitis: Secondary | ICD-10-CM

## 2021-02-17 ENCOUNTER — Other Ambulatory Visit: Payer: Self-pay | Admitting: Nurse Practitioner

## 2021-02-17 ENCOUNTER — Ambulatory Visit: Payer: 59 | Admitting: Internal Medicine

## 2021-02-17 LAB — HM MAMMOGRAPHY: HM Mammogram: NORMAL (ref 0–4)

## 2021-02-18 MED ORDER — ALPRAZOLAM 0.5 MG PO TABS: 0.5000 mg | ORAL_TABLET | Freq: Every day | ORAL | 0 refills | Status: DC

## 2021-02-18 NOTE — Telephone Encounter (Signed)
Pt states she wants to see if you would be her PCP since Casimiro Needle is leaving.

## 2021-03-11 ENCOUNTER — Other Ambulatory Visit: Payer: Self-pay | Admitting: Internal Medicine

## 2021-03-11 ENCOUNTER — Other Ambulatory Visit: Payer: Self-pay | Admitting: Family Medicine

## 2021-03-11 DIAGNOSIS — J302 Other seasonal allergic rhinitis: Secondary | ICD-10-CM

## 2021-03-11 DIAGNOSIS — G4700 Insomnia, unspecified: Secondary | ICD-10-CM

## 2021-03-12 ENCOUNTER — Other Ambulatory Visit: Payer: Self-pay | Admitting: Internal Medicine

## 2021-03-12 DIAGNOSIS — I10 Essential (primary) hypertension: Secondary | ICD-10-CM

## 2021-03-12 MED ORDER — DESLORATADINE 5 MG PO TABS: 5.0000 mg | ORAL_TABLET | Freq: Every day | ORAL | 0 refills | Status: DC

## 2021-03-12 MED ORDER — ALPRAZOLAM 0.5 MG PO TABS: 0.5000 mg | ORAL_TABLET | Freq: Every day | ORAL | 0 refills | Status: DC

## 2021-03-12 MED ORDER — SERTRALINE HCL 50 MG PO TABS: 50.0000 mg | ORAL_TABLET | Freq: Every day | ORAL | 0 refills | Status: DC

## 2021-03-12 MED ORDER — ROSUVASTATIN CALCIUM 20 MG PO TABS: 20.0000 mg | ORAL_TABLET | Freq: Every day | ORAL | 0 refills | Status: DC

## 2021-03-17 ENCOUNTER — Encounter: Payer: Self-pay | Admitting: *Deleted

## 2021-03-19 ENCOUNTER — Ambulatory Visit: Payer: 59 | Admitting: Internal Medicine

## 2021-03-19 ENCOUNTER — Other Ambulatory Visit: Payer: Self-pay

## 2021-03-19 ENCOUNTER — Encounter: Payer: Self-pay | Admitting: Internal Medicine

## 2021-03-19 VITALS — BP 112/76 | HR 80 | Resp 18 | Ht 61.0 in | Wt 152.1 lb

## 2021-03-19 DIAGNOSIS — F3341 Major depressive disorder, recurrent, in partial remission: Secondary | ICD-10-CM | POA: Insufficient documentation

## 2021-03-19 DIAGNOSIS — Z1159 Encounter for screening for other viral diseases: Secondary | ICD-10-CM

## 2021-03-19 DIAGNOSIS — Z114 Encounter for screening for human immunodeficiency virus [HIV]: Secondary | ICD-10-CM

## 2021-03-19 DIAGNOSIS — I10 Essential (primary) hypertension: Secondary | ICD-10-CM | POA: Diagnosis not present

## 2021-03-19 DIAGNOSIS — E785 Hyperlipidemia, unspecified: Secondary | ICD-10-CM | POA: Insufficient documentation

## 2021-03-19 DIAGNOSIS — N1832 Chronic kidney disease, stage 3b: Secondary | ICD-10-CM | POA: Diagnosis not present

## 2021-03-19 DIAGNOSIS — M816 Localized osteoporosis [Lequesne]: Secondary | ICD-10-CM | POA: Diagnosis not present

## 2021-03-19 DIAGNOSIS — M810 Age-related osteoporosis without current pathological fracture: Secondary | ICD-10-CM | POA: Insufficient documentation

## 2021-03-19 DIAGNOSIS — E782 Mixed hyperlipidemia: Secondary | ICD-10-CM

## 2021-03-19 DIAGNOSIS — G4700 Insomnia, unspecified: Secondary | ICD-10-CM | POA: Diagnosis not present

## 2021-03-19 DIAGNOSIS — F325 Major depressive disorder, single episode, in full remission: Secondary | ICD-10-CM

## 2021-03-19 DIAGNOSIS — K219 Gastro-esophageal reflux disease without esophagitis: Secondary | ICD-10-CM

## 2021-03-19 NOTE — Assessment & Plan Note (Signed)
Takes rabeprazole

## 2021-03-19 NOTE — Assessment & Plan Note (Signed)
Well-controlled with Zoloft °

## 2021-03-19 NOTE — Assessment & Plan Note (Signed)
Last BMP reviewed On ARB Asymptomatic currently Has been referred to Nephrology in the last visit, she could not schedule appointment due to her work schedule Will check BMP and UA today

## 2021-03-19 NOTE — Patient Instructions (Signed)
Please continue taking medications as prescribed.  Please continue to follow low salt diet and perform moderate exercise/walking at least 150 mins/week. 

## 2021-03-19 NOTE — Progress Notes (Signed)
Established Patient Office Visit  Subjective:  Patient ID: Carol Maldonado, female    DOB: 1961-02-16  Age: 60 y.o. MRN: [id number]  CC:  Chief Complaint  Patient presents with   Follow-up    Follow up     HPI Carol Maldonado is a 60 y.o. female with past medical history of HTN, GERD, osteoporosis, CKD stage IIIb, depression with insomnia and HLD who presents for f/u of her chronic medical conditions.  HTN: BP is well-controlled. Takes medications regularly. Patient denies headache, dizziness, chest pain, dyspnea or palpitations.  CKD stage 3b: BMP reviewed from chart.  She denies any dysuria, hematuria, urinary hesitance or resistance.  She was referred to Nephrology in the last visit, but has not been able to see them due to her work schedule.  She is on ARB and vitamin D supplements currently.  She takes alendronate for osteoporosis and tolerates it well.  She takes Rabeprazole for GERD. Denies any dysphagia or odynophagia.  She has been taking Zoloft for depression and takes Xanax at bedtime for insomnia.  Denies any anhedonia, SI or HI.  She has had pneumococcal and Shingrix vaccines in the past.  Past Medical History:  Diagnosis Date   Allergy    Phreesia 02/29/2020   Anxiety    Phreesia 02/29/2020   Hyperlipidemia    Phreesia 02/29/2020   Hypertension    Phreesia 02/29/2020   Insomnia    Osteoporosis    Phreesia 02/29/2020   Seasonal allergies     Past Surgical History:  Procedure Laterality Date   COLONOSCOPY  03/27/2011   Dr. Jena Gauss; entirely normal exam and recommended repeat in 10 years.   COLONOSCOPY WITH PROPOFOL N/A 08/28/2020   Procedure: COLONOSCOPY WITH PROPOFOL;  Surgeon: Corbin Ade, MD;  Location: [facility];  Service: Endoscopy;  Laterality: N/A;  8:30am    Family History  Problem Relation Age of Onset   Hypertension Mother    Heart attack Mother    Pneumonia Maternal Grandmother    Other Maternal Grandfather        MVA    Hypertension Father    High Cholesterol Father    Irritable bowel syndrome Sister    Irritable bowel syndrome Sister    Colon cancer Neg Hx     Social History   Socioeconomic History   Marital status: Single    Spouse name: Not on file   Number of children: Not on file   Years of education: Not on file   Highest education level: Not on file  Occupational History   Not on file  Tobacco Use   Smoking status: Never   Smokeless tobacco: Never  Vaping Use   Vaping Use: Never used  Substance and Sexual Activity   Alcohol use: Never    Alcohol/week: 0.0 standard drinks   Drug use: Never   Sexual activity: Not Currently    Birth control/protection: Post-menopausal  Other Topics Concern   Not on file  Social History Narrative   Not on file   Social Determinants of Health   Financial Resource Strain: Not on file  Food Insecurity: Not on file  Transportation Needs: Not on file  Physical Activity: Not on file  Stress: Not on file  Social Connections: Not on file  Intimate Partner Violence: Not on file    Outpatient Medications Prior to Visit  Medication Sig Dispense Refill   albuterol (VENTOLIN HFA) 108 (90 Base) MCG/ACT inhaler Inhale 2 puffs into the lungs as  needed. (Patient taking differently: Inhale 2 puffs into the lungs every 6 (six) hours as needed for wheezing or shortness of breath.) 6.7 g 3   alendronate (FOSAMAX) 70 MG tablet Take 1 tablet (70 mg total) by mouth every 7 (seven) days. Take with a full glass of water on an empty stomach. 4 tablet 11   ALPRAZolam (XANAX) 0.5 MG tablet Take 1 tablet (0.5 mg total) by mouth at bedtime. 30 tablet 0   amlodipine-olmesartan (AZOR) 10-20 MG tablet TAKE 1 TABLET ONCE DAILY. 30 tablet 0   azelastine (ASTELIN) 0.1 % nasal spray Place 1 spray into both nostrils 2 (two) times daily. Use in each nostril as directed 30 mL 1   Calcium Carb-Cholecalciferol (CALCIUM 600/VITAMIN D3 PO) Take 1 tablet by mouth daily.     cholecalciferol  (VITAMIN D3) 25 MCG (1000 UNIT) tablet Take 1,000 Units by mouth daily.     desloratadine (CLARINEX) 5 MG tablet Take 1 tablet (5 mg total) by mouth daily. 30 tablet 0   flunisolide (NASALIDE) 25 MCG/ACT (0.025%) SOLN Place 2 sprays into the nose 2 (two) times daily.     ibuprofen (ADVIL,MOTRIN) 200 MG tablet Take 400 mg by mouth every 8 (eight) hours as needed for moderate pain.     Omega-3 Fatty Acids (FISH OIL) 1000 MG CAPS Take 1,000 mg by mouth daily.     RABEprazole (ACIPHEX) 20 MG tablet Take 1 tablet (20 mg total) by mouth daily. 90 tablet 1   rosuvastatin (CRESTOR) 20 MG tablet Take 1 tablet (20 mg total) by mouth daily. 30 tablet 0   sertraline (ZOLOFT) 50 MG tablet Take 1 tablet (50 mg total) by mouth at bedtime. 30 tablet 0   No facility-administered medications prior to visit.    Allergies  Allergen Reactions   Amoxicillin Hives, Itching and Rash   Codeine Hives, Itching and Rash   Hydrocodone Rash   Keflex [Cephalexin] Rash   Penicillins Rash   Sulfa Antibiotics Rash    ROS Review of Systems  Constitutional:  Negative for chills and fever.  HENT:  Negative for congestion, sinus pressure, sinus pain and sore throat.   Eyes:  Negative for pain and discharge.  Respiratory:  Negative for cough and shortness of breath.   Cardiovascular:  Negative for chest pain and palpitations.  Gastrointestinal:  Negative for abdominal pain, constipation, diarrhea, nausea and vomiting.  Endocrine: Negative for polydipsia and polyuria.  Genitourinary:  Negative for dysuria and hematuria.  Musculoskeletal:  Negative for neck pain and neck stiffness.  Skin:  Negative for rash.  Neurological:  Negative for dizziness and weakness.  Psychiatric/Behavioral:  Negative for agitation and behavioral problems.      Objective:    Physical Exam Vitals reviewed.  Constitutional:      General: She is not in acute distress.    Appearance: She is not diaphoretic.  HENT:     Head: Normocephalic  and atraumatic.     Nose: Nose normal. No congestion.     Mouth/Throat:     Mouth: Mucous membranes are moist.     Pharynx: No posterior oropharyngeal erythema.  Eyes:     General: No scleral icterus.    Extraocular Movements: Extraocular movements intact.  Cardiovascular:     Rate and Rhythm: Normal rate and regular rhythm.     Pulses: Normal pulses.     Heart sounds: Normal heart sounds. No murmur heard. Pulmonary:     Breath sounds: Normal breath sounds. No wheezing or rales.  Musculoskeletal:     Cervical back: Neck supple. No tenderness.     Right lower leg: No edema.     Left lower leg: No edema.  Skin:    General: Skin is warm.     Findings: No rash.  Neurological:     General: No focal deficit present.     Mental Status: She is alert and oriented to person, place, and time.     Sensory: No sensory deficit.     Motor: No weakness.  Psychiatric:        Mood and Affect: Mood normal.        Behavior: Behavior normal.    BP 112/76 (BP Location: Left Arm, Patient Position: Sitting, Cuff Size: Normal)    Pulse 80    Resp 18    Ht $R'5\' 1"'Sl$  (1.549 m)    Wt 152 lb 1.9 oz (69 kg)    SpO2 100%    BMI 28.74 kg/m  Wt Readings from Last 3 Encounters:  03/19/21 152 lb 1.9 oz (69 kg)  11/12/20 144 lb (65.3 kg)  08/28/20 138 lb (62.6 kg)    No results found for: TSH Lab Results  Component Value Date   WBC 6.5 11/10/2020   HGB 10.9 (L) 11/10/2020   HCT 34.7 11/10/2020   MCV 85 11/10/2020   PLT 203 02/28/2020   Lab Results  Component Value Date   NA 136 11/25/2020   K 5.3 (H) 11/25/2020   CO2 21 11/25/2020   GLUCOSE 87 11/25/2020   BUN 16 11/25/2020   CREATININE 1.32 (H) 11/25/2020   BILITOT 0.3 11/10/2020   ALKPHOS 70 11/10/2020   AST 21 11/10/2020   ALT 9 11/10/2020   PROT 7.4 11/10/2020   ALBUMIN 4.9 (H) 11/10/2020   CALCIUM 9.4 11/25/2020   EGFR 46 (L) 11/25/2020   Lab Results  Component Value Date   CHOL 140 11/10/2020   Lab Results  Component Value Date    HDL 67 11/10/2020   Lab Results  Component Value Date   LDLCALC 58 11/10/2020   Lab Results  Component Value Date   TRIG 75 11/10/2020   Lab Results  Component Value Date   CHOLHDL 2.1 11/10/2020   Lab Results  Component Value Date   HGBA1C 5.8 (H) 02/28/2020      Assessment & Plan:   Problem List Items Addressed This Visit       Cardiovascular and Mediastinum   Essential hypertension, benign - Primary    BP Readings from Last 1 Encounters:  03/19/21 112/76  Well-controlled with Amlodipine-Olmesartan Counseled for compliance with the medications Advised DASH diet and moderate exercise/walking, at least 150 mins/week        Digestive   GERD (gastroesophageal reflux disease)    Takes rabeprazole        Musculoskeletal and Integument   Osteoporosis    On alendronate since 2020 Takes calcium and vitamin D supplements        Genitourinary   Stage 3b chronic kidney disease ([place])    Last BMP reviewed On ARB Asymptomatic currently Has been referred to Nephrology in the last visit, she could not schedule appointment due to her work schedule Will check BMP and UA today      Relevant Orders   Basic Metabolic Panel (BMET)   Hemoglobin A1c   Parathyroid hormone, intact (no Ca)   Urinalysis     Other   Insomnia, unspecified    Takes Xanax 0.5 mg nightly as needed  HLD (hyperlipidemia)    On statin      Depression, major, single episode, complete remission ([place])    Well controlled with Zoloft      Other Visit Diagnoses     Encounter for screening for HIV       Relevant Orders   HIV antibody (with reflex)   Need for hepatitis C screening test       Relevant Orders   Hepatitis C Antibody       No orders of the defined types were placed in this encounter.   Follow-up: Return in about 4 months (around 07/18/2021) for CKD and HTN.    Lindell Spar, MD

## 2021-03-19 NOTE — Assessment & Plan Note (Signed)
Takes Xanax 0.5 mg nightly as needed 

## 2021-03-19 NOTE — Assessment & Plan Note (Addendum)
On alendronate since 2020 Takes calcium and vitamin D supplements 

## 2021-03-19 NOTE — Assessment & Plan Note (Signed)
On statin.

## 2021-03-19 NOTE — Assessment & Plan Note (Signed)
BP Readings from Last 1 Encounters:  03/19/21 112/76   Well-controlled with Amlodipine-Olmesartan Counseled for compliance with the medications Advised DASH diet and moderate exercise/walking, at least 150 mins/week

## 2021-03-20 LAB — HEPATITIS C ANTIBODY: Hep C Virus Ab: <0.1 s/co ratio (ref 0.0–0.9)

## 2021-03-20 LAB — HIV ANTIBODY (ROUTINE TESTING W REFLEX): HIV Screen 4th Generation wRfx: NONREACTIVE

## 2021-03-21 LAB — BASIC METABOLIC PANEL
BUN/Creatinine Ratio: 12 (ref 12–28)
BUN: 20 mg/dL (ref 8–27)
CO2: 20 mmol/L (ref 20–29)
Calcium: 9.5 mg/dL (ref 8.7–10.3)
Chloride: 108 mmol/L — ABNORMAL HIGH (ref 96–106)
Creatinine, Ser: 1.67 mg/dL — ABNORMAL HIGH (ref 0.57–1.00)
Glucose: 93 mg/dL (ref 70–99)
Potassium: 5.4 mmol/L — ABNORMAL HIGH (ref 3.5–5.2)
Sodium: 140 mmol/L (ref 134–144)
eGFR: 34 mL/min/{1.73_m2} — ABNORMAL LOW (ref >59)

## 2021-03-21 LAB — HEMOGLOBIN A1C
Est. average glucose Bld gHb Est-mCnc: 123 mg/dL
Hgb A1c MFr Bld: 5.9 % — ABNORMAL HIGH (ref 4.8–5.6)

## 2021-03-21 LAB — URINALYSIS
Bilirubin, UA: NEGATIVE
Glucose, UA: NEGATIVE
Ketones, UA: NEGATIVE
Leukocytes,UA: NEGATIVE
Nitrite, UA: NEGATIVE
Protein,UA: NEGATIVE
RBC, UA: NEGATIVE
Specific Gravity, UA: 1.011 (ref 1.005–1.030)
Urobilinogen, Ur: 0.2 mg/dL (ref 0.2–1.0)
pH, UA: 6 (ref 5.0–7.5)

## 2021-03-21 LAB — PARATHYROID HORMONE, INTACT (NO CA): PTH: 88 pg/mL — ABNORMAL HIGH (ref 15–65)

## 2021-03-25 ENCOUNTER — Other Ambulatory Visit: Payer: Self-pay | Admitting: Internal Medicine

## 2021-03-26 NOTE — Telephone Encounter (Signed)
Patient hasnt gotten that script she's going to call her pharmacy now

## 2021-04-09 ENCOUNTER — Other Ambulatory Visit: Payer: Self-pay | Admitting: Internal Medicine

## 2021-04-09 ENCOUNTER — Other Ambulatory Visit: Payer: Self-pay | Admitting: Family Medicine

## 2021-04-09 DIAGNOSIS — I10 Essential (primary) hypertension: Secondary | ICD-10-CM

## 2021-04-09 DIAGNOSIS — G4700 Insomnia, unspecified: Secondary | ICD-10-CM

## 2021-04-09 DIAGNOSIS — J302 Other seasonal allergic rhinitis: Secondary | ICD-10-CM

## 2021-04-09 MED ORDER — SERTRALINE HCL 50 MG PO TABS: 50.0000 mg | ORAL_TABLET | Freq: Every day | ORAL | 0 refills | Status: DC

## 2021-04-09 MED ORDER — DESLORATADINE 5 MG PO TABS: 5.0000 mg | ORAL_TABLET | Freq: Every day | ORAL | 0 refills | Status: DC

## 2021-04-09 MED ORDER — AMLODIPINE-OLMESARTAN 10-20 MG PO TABS: 1.0000 | ORAL_TABLET | Freq: Every day | ORAL | 0 refills | Status: DC

## 2021-04-09 MED ORDER — ROSUVASTATIN CALCIUM 20 MG PO TABS: 20.0000 mg | ORAL_TABLET | Freq: Every day | ORAL | 0 refills | Status: DC

## 2021-04-22 ENCOUNTER — Other Ambulatory Visit: Payer: Self-pay | Admitting: Internal Medicine

## 2021-04-23 MED ORDER — ALPRAZOLAM 0.5 MG PO TABS: 0.5000 mg | ORAL_TABLET | Freq: Every day | ORAL | 2 refills | Status: DC

## 2021-04-27 ENCOUNTER — Other Ambulatory Visit: Payer: Self-pay | Admitting: Nephrology

## 2021-04-27 ENCOUNTER — Other Ambulatory Visit (HOSPITAL_COMMUNITY): Payer: Self-pay | Admitting: Nephrology

## 2021-04-27 DIAGNOSIS — I129 Hypertensive chronic kidney disease with stage 1 through stage 4 chronic kidney disease, or unspecified chronic kidney disease: Secondary | ICD-10-CM

## 2021-04-27 DIAGNOSIS — N170 Acute kidney failure with tubular necrosis: Secondary | ICD-10-CM

## 2021-04-27 DIAGNOSIS — N1832 Chronic kidney disease, stage 3b: Secondary | ICD-10-CM

## 2021-04-30 ENCOUNTER — Other Ambulatory Visit: Payer: Self-pay

## 2021-04-30 ENCOUNTER — Ambulatory Visit (HOSPITAL_COMMUNITY)
Admission: RE | Admit: 2021-04-30 | Discharge: 2021-04-30 | Disposition: A | Discharge status: Home / Self Care | Payer: 59 | Source: Ambulatory Visit | Attending: Nephrology | Admitting: Nephrology

## 2021-04-30 DIAGNOSIS — N1832 Chronic kidney disease, stage 3b: Secondary | ICD-10-CM | POA: Insufficient documentation

## 2021-04-30 DIAGNOSIS — N170 Acute kidney failure with tubular necrosis: Secondary | ICD-10-CM | POA: Insufficient documentation

## 2021-04-30 DIAGNOSIS — I129 Hypertensive chronic kidney disease with stage 1 through stage 4 chronic kidney disease, or unspecified chronic kidney disease: Secondary | ICD-10-CM | POA: Diagnosis present

## 2021-05-07 ENCOUNTER — Other Ambulatory Visit: Payer: Self-pay | Admitting: Internal Medicine

## 2021-05-07 DIAGNOSIS — G4700 Insomnia, unspecified: Secondary | ICD-10-CM

## 2021-05-07 DIAGNOSIS — J302 Other seasonal allergic rhinitis: Secondary | ICD-10-CM

## 2021-05-07 DIAGNOSIS — I10 Essential (primary) hypertension: Secondary | ICD-10-CM

## 2021-05-07 MED ORDER — SERTRALINE HCL 50 MG PO TABS: 50.0000 mg | ORAL_TABLET | Freq: Every day | ORAL | 0 refills | Status: DC

## 2021-05-07 MED ORDER — ROSUVASTATIN CALCIUM 20 MG PO TABS: 20.0000 mg | ORAL_TABLET | Freq: Every day | ORAL | 0 refills | Status: DC

## 2021-05-07 MED ORDER — DESLORATADINE 5 MG PO TABS: 5.0000 mg | ORAL_TABLET | Freq: Every day | ORAL | 0 refills | Status: DC

## 2021-05-07 MED ORDER — AMLODIPINE-OLMESARTAN 10-20 MG PO TABS: 1.0000 | ORAL_TABLET | Freq: Every day | ORAL | 0 refills | Status: DC

## 2021-06-02 ENCOUNTER — Other Ambulatory Visit: Payer: Self-pay | Admitting: Internal Medicine

## 2021-06-02 DIAGNOSIS — G4700 Insomnia, unspecified: Secondary | ICD-10-CM

## 2021-06-02 DIAGNOSIS — I10 Essential (primary) hypertension: Secondary | ICD-10-CM

## 2021-06-02 DIAGNOSIS — J302 Other seasonal allergic rhinitis: Secondary | ICD-10-CM

## 2021-06-03 MED ORDER — SERTRALINE HCL 50 MG PO TABS: 50.0000 mg | ORAL_TABLET | Freq: Every day | ORAL | 0 refills | Status: DC

## 2021-06-03 MED ORDER — AMLODIPINE-OLMESARTAN 10-20 MG PO TABS: 1.0000 | ORAL_TABLET | Freq: Every day | ORAL | 0 refills | Status: DC

## 2021-06-03 MED ORDER — DESLORATADINE 5 MG PO TABS: 5.0000 mg | ORAL_TABLET | Freq: Every day | ORAL | 0 refills | Status: DC

## 2021-06-03 MED ORDER — ROSUVASTATIN CALCIUM 20 MG PO TABS: 20.0000 mg | ORAL_TABLET | Freq: Every day | ORAL | 0 refills | Status: DC

## 2021-06-14 ENCOUNTER — Other Ambulatory Visit: Payer: Self-pay | Admitting: Internal Medicine

## 2021-06-15 MED ORDER — ROSUVASTATIN CALCIUM 20 MG PO TABS: 20.0000 mg | ORAL_TABLET | Freq: Every day | ORAL | 3 refills | Status: DC

## 2021-07-02 ENCOUNTER — Other Ambulatory Visit: Payer: Self-pay | Admitting: Internal Medicine

## 2021-07-02 DIAGNOSIS — G4700 Insomnia, unspecified: Secondary | ICD-10-CM

## 2021-07-02 DIAGNOSIS — J302 Other seasonal allergic rhinitis: Secondary | ICD-10-CM

## 2021-07-02 DIAGNOSIS — I10 Essential (primary) hypertension: Secondary | ICD-10-CM

## 2021-07-03 ENCOUNTER — Other Ambulatory Visit: Payer: Self-pay | Admitting: Internal Medicine

## 2021-07-03 DIAGNOSIS — J302 Other seasonal allergic rhinitis: Secondary | ICD-10-CM

## 2021-07-03 DIAGNOSIS — I10 Essential (primary) hypertension: Secondary | ICD-10-CM

## 2021-07-03 DIAGNOSIS — G4700 Insomnia, unspecified: Secondary | ICD-10-CM

## 2021-07-04 ENCOUNTER — Other Ambulatory Visit: Payer: Self-pay | Admitting: Internal Medicine

## 2021-07-04 DIAGNOSIS — I10 Essential (primary) hypertension: Secondary | ICD-10-CM

## 2021-07-06 ENCOUNTER — Other Ambulatory Visit: Payer: Self-pay | Admitting: Internal Medicine

## 2021-07-06 MED ORDER — AMLODIPINE-OLMESARTAN 10-20 MG PO TABS: 1.0000 | ORAL_TABLET | Freq: Every day | ORAL | 0 refills | Status: DC

## 2021-07-06 MED ORDER — SERTRALINE HCL 50 MG PO TABS: 50.0000 mg | ORAL_TABLET | Freq: Every day | ORAL | 5 refills | Status: DC

## 2021-07-06 MED ORDER — DESLORATADINE 5 MG PO TABS: 5.0000 mg | ORAL_TABLET | Freq: Every day | ORAL | 0 refills | Status: DC

## 2021-07-16 ENCOUNTER — Ambulatory Visit: Payer: 59 | Admitting: Internal Medicine

## 2021-07-23 ENCOUNTER — Ambulatory Visit: Payer: 59 | Admitting: Internal Medicine

## 2021-08-04 ENCOUNTER — Other Ambulatory Visit: Payer: Self-pay | Admitting: Internal Medicine

## 2021-08-04 DIAGNOSIS — I10 Essential (primary) hypertension: Secondary | ICD-10-CM

## 2021-08-04 DIAGNOSIS — J302 Other seasonal allergic rhinitis: Secondary | ICD-10-CM

## 2021-08-05 MED ORDER — DESLORATADINE 5 MG PO TABS: 5.0000 mg | ORAL_TABLET | Freq: Every day | ORAL | 0 refills | Status: DC

## 2021-08-05 MED ORDER — AMLODIPINE-OLMESARTAN 10-20 MG PO TABS: 1.0000 | ORAL_TABLET | Freq: Every day | ORAL | 0 refills | Status: DC

## 2021-08-05 MED ORDER — ROSUVASTATIN CALCIUM 20 MG PO TABS: 20.0000 mg | ORAL_TABLET | Freq: Every day | ORAL | 0 refills | Status: DC

## 2021-08-17 ENCOUNTER — Ambulatory Visit: Payer: 59 | Admitting: Internal Medicine

## 2021-09-02 ENCOUNTER — Other Ambulatory Visit: Payer: Self-pay | Admitting: Internal Medicine

## 2021-09-02 DIAGNOSIS — I10 Essential (primary) hypertension: Secondary | ICD-10-CM

## 2021-09-02 DIAGNOSIS — J302 Other seasonal allergic rhinitis: Secondary | ICD-10-CM

## 2021-09-02 DIAGNOSIS — K219 Gastro-esophageal reflux disease without esophagitis: Secondary | ICD-10-CM

## 2021-09-02 MED ORDER — DESLORATADINE 5 MG PO TABS: 5.0000 mg | ORAL_TABLET | Freq: Every day | ORAL | 0 refills | Status: DC

## 2021-09-02 MED ORDER — ROSUVASTATIN CALCIUM 20 MG PO TABS: 20.0000 mg | ORAL_TABLET | Freq: Every day | ORAL | 0 refills | Status: DC

## 2021-09-02 MED ORDER — AMLODIPINE-OLMESARTAN 10-20 MG PO TABS: 1.0000 | ORAL_TABLET | Freq: Every day | ORAL | 0 refills | Status: DC

## 2021-09-26 ENCOUNTER — Other Ambulatory Visit: Payer: Self-pay | Admitting: Internal Medicine

## 2021-09-26 DIAGNOSIS — J302 Other seasonal allergic rhinitis: Secondary | ICD-10-CM

## 2021-09-26 DIAGNOSIS — I10 Essential (primary) hypertension: Secondary | ICD-10-CM

## 2021-09-30 ENCOUNTER — Ambulatory Visit: Payer: 59 | Admitting: Internal Medicine

## 2021-10-22 ENCOUNTER — Ambulatory Visit: Payer: 59 | Admitting: Internal Medicine

## 2021-10-30 ENCOUNTER — Other Ambulatory Visit: Payer: Self-pay | Admitting: Internal Medicine

## 2021-10-30 DIAGNOSIS — I10 Essential (primary) hypertension: Secondary | ICD-10-CM

## 2021-10-30 DIAGNOSIS — J302 Other seasonal allergic rhinitis: Secondary | ICD-10-CM

## 2021-11-02 MED ORDER — AMLODIPINE-OLMESARTAN 10-20 MG PO TABS: 1.0000 | ORAL_TABLET | Freq: Every day | ORAL | 0 refills | Status: DC

## 2021-11-09 ENCOUNTER — Encounter: Payer: Self-pay | Admitting: Internal Medicine

## 2021-11-09 ENCOUNTER — Ambulatory Visit: Payer: 59 | Admitting: Internal Medicine

## 2021-11-09 VITALS — BP 122/86 | HR 79 | Resp 18 | Ht 61.0 in | Wt 157.0 lb

## 2021-11-09 DIAGNOSIS — M816 Localized osteoporosis [Lequesne]: Secondary | ICD-10-CM

## 2021-11-09 DIAGNOSIS — N1832 Chronic kidney disease, stage 3b: Secondary | ICD-10-CM

## 2021-11-09 DIAGNOSIS — E782 Mixed hyperlipidemia: Secondary | ICD-10-CM

## 2021-11-09 DIAGNOSIS — K219 Gastro-esophageal reflux disease without esophagitis: Secondary | ICD-10-CM

## 2021-11-09 DIAGNOSIS — F3341 Major depressive disorder, recurrent, in partial remission: Secondary | ICD-10-CM

## 2021-11-09 DIAGNOSIS — Z0001 Encounter for general adult medical examination with abnormal findings: Secondary | ICD-10-CM

## 2021-11-09 DIAGNOSIS — E875 Hyperkalemia: Secondary | ICD-10-CM

## 2021-11-09 DIAGNOSIS — I10 Essential (primary) hypertension: Secondary | ICD-10-CM | POA: Diagnosis not present

## 2021-11-09 DIAGNOSIS — G4700 Insomnia, unspecified: Secondary | ICD-10-CM

## 2021-11-09 DIAGNOSIS — R7303 Prediabetes: Secondary | ICD-10-CM

## 2021-11-09 MED ORDER — AMLODIPINE-OLMESARTAN 10-20 MG PO TABS: 1.0000 | ORAL_TABLET | Freq: Every day | ORAL | 1 refills | Status: DC

## 2021-11-09 MED ORDER — ROSUVASTATIN CALCIUM 20 MG PO TABS: 20.0000 mg | ORAL_TABLET | Freq: Every day | ORAL | 3 refills | Status: DC

## 2021-11-09 MED ORDER — SERTRALINE HCL 50 MG PO TABS: 50.0000 mg | ORAL_TABLET | Freq: Every day | ORAL | 3 refills | Status: DC

## 2021-11-09 NOTE — Assessment & Plan Note (Signed)
On statin.

## 2021-11-09 NOTE — Progress Notes (Signed)
Established Patient Office Visit  Subjective:  Patient ID: Carol Maldonado, female    DOB: 12-16-1960  Age: 61 y.o. MRN: [id number]  CC:  Chief Complaint  Patient presents with   Annual Exam    HPI Carol Maldonado is a 61 y.o. female with past medical history of HTN, GERD, osteoporosis, CKD stage IIIb, depression with insomnia and HLD who presents for annual physical.  HTN: BP is well-controlled. Takes medications regularly. Patient denies headache, dizziness, chest pain, dyspnea or palpitations.   CKD stage 3b: BMP reviewed from chart.  She denies any dysuria, hematuria, urinary hesitance or resistance.  She was referred to Nephrology in the last visit, but has not been able to see them recently due to her work schedule.  She is on ARB and vitamin D supplements currently.  She takes alendronate for osteoporosis and tolerates it well.  She takes Rabeprazole for GERD. Denies any dysphagia or odynophagia.  She has been taking Zoloft for depression and takes Xanax at bedtime for insomnia.  Denies any anhedonia, SI or HI.  Past Medical History:  Diagnosis Date   Allergy    [facility]ia 02/29/2020   Anxiety    [facility]reesia 02/29/2020   Hyperlipidemia    [facility]reesia 02/29/2020   Hypertension    Phreesia 02/29/2020   Insomnia    Osteoporosis    Phreesia 02/29/2020   Seasonal allergies     Past Surgical History:  Procedure Laterality Date   COLONOSCOPY  03/27/2011   Dr. Jena Gauss; entirely normal exam and recommended repeat in 10 years.   COLONOSCOPY WITH PROPOFOL N/A 08/28/2020   Procedure: COLONOSCOPY WITH PROPOFOL;  Surgeon: Corbin Ade, MD;  Location: [facility];  Service: Endoscopy;  Laterality: N/A;  8:30am    Family History  Problem Relation Age of Onset   Hypertension Mother    Heart attack Mother    Pneumonia Maternal Grandmother    Other Maternal Grandfather        MVA   Hypertension Father    High Cholesterol Father    Irritable bowel syndrome Sister     Irritable bowel syndrome Sister    Colon cancer Neg Hx     Social History   Socioeconomic History   Marital status: Single    Spouse name: Not on file   Number of children: Not on file   Years of education: Not on file   Highest education level: Not on file  Occupational History   Not on file  Tobacco Use   Smoking status: Never   Smokeless tobacco: Never  Vaping Use   Vaping Use: Never used  Substance and Sexual Activity   Alcohol use: Never    Alcohol/week: 0.0 standard drinks of alcohol   Drug use: Never   Sexual activity: Not Currently    Birth control/protection: Post-menopausal  Other Topics Concern   Not on file  Social History Narrative   Not on file   Social Determinants of Health   Financial Resource Strain: Not on file  Food Insecurity: Not on file  Transportation Needs: Not on file  Physical Activity: Not on file  Stress: Not on file  Social Connections: Not on file  Intimate Partner Violence: Not on file    Outpatient Medications Prior to Visit  Medication Sig Dispense Refill   albuterol (VENTOLIN HFA) 108 (90 Base) MCG/ACT inhaler Inhale 2 puffs into the lungs as needed. (Patient taking differently: Inhale 2 puffs into the lungs every 6 (six) hours as needed  for wheezing or shortness of breath.) 6.7 g 3   alendronate (FOSAMAX) 70 MG tablet Take 1 tablet (70 mg total) by mouth every 7 (seven) days. Take with a full glass of water on an empty stomach. 4 tablet 11   ALPRAZolam (XANAX) 0.5 MG tablet TAKE 1 TABLET BY MOUTH AT BEDTIME. 30 tablet 3   azelastine (ASTELIN) 0.1 % nasal spray Place 1 spray into both nostrils 2 (two) times daily. Use in each nostril as directed 30 mL 1   Calcium Carb-Cholecalciferol (CALCIUM 600/VITAMIN D3 PO) Take 1 tablet by mouth daily.     cholecalciferol (VITAMIN D3) 25 MCG (1000 UNIT) tablet Take 1,000 Units by mouth daily.     desloratadine (CLARINEX) 5 MG tablet TAKE 1 TABLET ONCE DAILY. 30 tablet 0   flunisolide (NASALIDE)  25 MCG/ACT (0.025%) SOLN Place 2 sprays into the nose 2 (two) times daily.     ibuprofen (ADVIL,MOTRIN) 200 MG tablet Take 400 mg by mouth every 8 (eight) hours as needed for moderate pain.     Omega-3 Fatty Acids (FISH OIL) 1000 MG CAPS Take 1,000 mg by mouth daily.     RABEprazole (ACIPHEX) 20 MG tablet TAKE 1 TABLET ONCE DAILY. 90 tablet 1   amlodipine-olmesartan (AZOR) 10-20 MG tablet Take 1 tablet by mouth daily. 30 tablet 0   rosuvastatin (CRESTOR) 20 MG tablet TAKE 1 TABLET ONCE DAILY. 30 tablet 0   sertraline (ZOLOFT) 50 MG tablet Take 1 tablet (50 mg total) by mouth at bedtime. 30 tablet 5   amlodipine-olmesartan (AZOR) 10-20 MG tablet TAKE 1 TABLET ONCE DAILY. 30 tablet 0   amlodipine-olmesartan (AZOR) 10-20 MG tablet TAKE 1 TABLET ONCE DAILY. 30 tablet 0   No facility-administered medications prior to visit.    Allergies  Allergen Reactions   Amoxicillin Hives, Itching and Rash   Codeine Hives, Itching and Rash   Hydrocodone Rash   Keflex [Cephalexin] Rash   Penicillins Rash   Sulfa Antibiotics Rash    ROS Review of Systems  Constitutional:  Negative for chills and fever.  HENT:  Negative for congestion, sinus pressure, sinus pain and sore throat.   Eyes:  Negative for pain and discharge.  Respiratory:  Negative for cough and shortness of breath.   Cardiovascular:  Negative for chest pain and palpitations.  Gastrointestinal:  Negative for abdominal pain, constipation, diarrhea, nausea and vomiting.  Endocrine: Negative for polydipsia and polyuria.  Genitourinary:  Negative for dysuria and hematuria.  Musculoskeletal:  Negative for neck pain and neck stiffness.  Skin:  Negative for rash.  Neurological:  Negative for dizziness and weakness.  Psychiatric/Behavioral:  Positive for sleep disturbance. Negative for agitation and behavioral problems. The patient is nervous/anxious.       Objective:    Physical Exam Vitals reviewed.  Constitutional:      General: She is  not in acute distress.    Appearance: She is not diaphoretic.  HENT:     Head: Normocephalic and atraumatic.     Nose: Nose normal. No congestion.     Mouth/Throat:     Mouth: Mucous membranes are moist.     Pharynx: No posterior oropharyngeal erythema.  Eyes:     General: No scleral icterus.    Extraocular Movements: Extraocular movements intact.  Cardiovascular:     Rate and Rhythm: Normal rate and regular rhythm.     Pulses: Normal pulses.     Heart sounds: Normal heart sounds. No murmur heard. Pulmonary:  Breath sounds: Normal breath sounds. No wheezing or rales.  Abdominal:     Palpations: Abdomen is soft.     Tenderness: There is no abdominal tenderness.  Musculoskeletal:     Cervical back: Neck supple. No tenderness.     Right lower leg: No edema.     Left lower leg: No edema.  Skin:    General: Skin is warm.     Findings: No rash.  Neurological:     General: No focal deficit present.     Mental Status: She is alert and oriented to person, place, and time.     Cranial Nerves: No cranial nerve deficit.     Sensory: No sensory deficit.     Motor: No weakness.  Psychiatric:        Mood and Affect: Mood normal.        Behavior: Behavior normal.     BP 122/86 (BP Location: Right Arm, Patient Position: Sitting, Cuff Size: Normal)   Pulse 79   Resp 18   Ht $R'5\' 1"'KQ$  (1.549 m)   Wt 157 lb (71.2 kg)   SpO2 97%   BMI 29.66 kg/m  Wt Readings from Last 3 Encounters:  11/09/21 157 lb (71.2 kg)  03/19/21 152 lb 1.9 oz (69 kg)  11/12/20 144 lb (65.3 kg)    No results found for: "TSH" Lab Results  Component Value Date   WBC 6.5 11/10/2020   HGB 10.9 (L) 11/10/2020   HCT 34.7 11/10/2020   MCV 85 11/10/2020   PLT 203 02/28/2020   Lab Results  Component Value Date   NA 140 03/19/2021   K 5.4 (H) 03/19/2021   CO2 20 03/19/2021   GLUCOSE 93 03/19/2021   BUN 20 03/19/2021   CREATININE 1.67 (H) 03/19/2021   BILITOT 0.3 11/10/2020   ALKPHOS 70 11/10/2020   AST 21  11/10/2020   ALT 9 11/10/2020   PROT 7.4 11/10/2020   ALBUMIN 4.9 (H) 11/10/2020   CALCIUM 9.5 03/19/2021   EGFR 34 (L) 03/19/2021   Lab Results  Component Value Date   CHOL 140 11/10/2020   Lab Results  Component Value Date   HDL 67 11/10/2020   Lab Results  Component Value Date   LDLCALC 58 11/10/2020   Lab Results  Component Value Date   TRIG 75 11/10/2020   Lab Results  Component Value Date   CHOLHDL 2.1 11/10/2020   Lab Results  Component Value Date   HGBA1C 5.9 (H) 03/19/2021      Assessment & Plan:   Problem List Items Addressed This Visit       Cardiovascular and Mediastinum   Essential hypertension, benign    BP Readings from Last 1 Encounters:  11/09/21 122/86  Well-controlled with Amlodipine-Olmesartan Counseled for compliance with the medications Advised DASH diet and moderate exercise/walking, at least 150 mins/week      Relevant Medications   rosuvastatin (CRESTOR) 20 MG tablet   amlodipine-olmesartan (AZOR) 10-20 MG tablet     Digestive   GERD (gastroesophageal reflux disease)    Well-controlled with Rabeprazole        Musculoskeletal and Integument   Osteoporosis    On alendronate since 2020 Takes calcium and vitamin D supplements      Relevant Orders   VITAMIN D 25 Hydroxy (Vit-D Deficiency, Fractures)     Genitourinary   Stage 3b chronic kidney disease ([place])    Last BMP reviewed On ARB Asymptomatic currently Has been referred to Nephrology Will check CMP  Relevant Orders   TSH   CMP14+EGFR     Other   Insomnia, unspecified    Takes Xanax 0.5 mg nightly as needed      Relevant Medications   sertraline (ZOLOFT) 50 MG tablet   HLD (hyperlipidemia)    On statin      Relevant Medications   rosuvastatin (CRESTOR) 20 MG tablet   amlodipine-olmesartan (AZOR) 10-20 MG tablet   Other Relevant Orders   Lipid panel   MDD (major depressive disorder), recurrent, in partial remission ([place])    Well controlled with  Zoloft      Relevant Medications   sertraline (ZOLOFT) 50 MG tablet   Other Relevant Orders   TSH   Encounter for general adult medical examination with abnormal findings - Primary    Physical exam as documented. Fasting blood tests ordered. PAP smear with Ob/Gyn.      Relevant Orders   TSH   CMP14+EGFR   CBC with Differential/Platelet   Other Visit Diagnoses     Prediabetes       Relevant Orders   Hemoglobin A1c       Meds ordered this encounter  Medications   rosuvastatin (CRESTOR) 20 MG tablet    Sig: Take 1 tablet (20 mg total) by mouth daily.    Dispense:  90 tablet    Refill:  3   amlodipine-olmesartan (AZOR) 10-20 MG tablet    Sig: Take 1 tablet by mouth daily.    Dispense:  90 tablet    Refill:  1   sertraline (ZOLOFT) 50 MG tablet    Sig: Take 1 tablet (50 mg total) by mouth at bedtime.    Dispense:  90 tablet    Refill:  3    Follow-up: Return in about 6 months (around 05/12/2022) for HTN and CKD.    Lindell Spar, MD

## 2021-11-09 NOTE — Assessment & Plan Note (Signed)
Well-controlled with Rabeprazole

## 2021-11-09 NOTE — Assessment & Plan Note (Signed)
Last BMP reviewed On ARB Asymptomatic currently Has been referred to Nephrology Will check CMP

## 2021-11-09 NOTE — Assessment & Plan Note (Signed)
BP Readings from Last 1 Encounters:  11/09/21 122/86   Well-controlled with Amlodipine-Olmesartan Counseled for compliance with the medications Advised DASH diet and moderate exercise/walking, at least 150 mins/week

## 2021-11-09 NOTE — Assessment & Plan Note (Signed)
Takes Xanax 0.5 mg nightly as needed

## 2021-11-09 NOTE — Assessment & Plan Note (Addendum)
Physical exam as documented. Fasting blood tests ordered. PAP smear with Ob/Gyn.

## 2021-11-09 NOTE — Assessment & Plan Note (Signed)
Well-controlled with Zoloft °

## 2021-11-09 NOTE — Assessment & Plan Note (Signed)
On alendronate since 2020 Takes calcium and vitamin D supplements 

## 2021-11-09 NOTE — Patient Instructions (Addendum)
Please continue taking medications as prescribed.  Please take ferrous sulfate 325 mg once daily.  Please continue to take at least 64 ounces of fluid in a day.

## 2021-11-10 LAB — CBC WITH DIFFERENTIAL/PLATELET
Basophils Absolute: 0 10*3/uL (ref 0.0–0.2)
Basos: 0 %
EOS (ABSOLUTE): 0.1 10*3/uL (ref 0.0–0.4)
Eos: 2 %
Hematocrit: 34.4 % (ref 34.0–46.6)
Hemoglobin: 11 g/dL — ABNORMAL LOW (ref 11.1–15.9)
Immature Grans (Abs): 0 10*3/uL (ref 0.0–0.1)
Immature Granulocytes: 0 %
Lymphocytes Absolute: 1.7 10*3/uL (ref 0.7–3.1)
Lymphs: 21 %
MCH: 26.3 pg — ABNORMAL LOW (ref 26.6–33.0)
MCHC: 32 g/dL (ref 31.5–35.7)
MCV: 82 fL (ref 79–97)
Monocytes Absolute: 0.5 10*3/uL (ref 0.1–0.9)
Monocytes: 6 %
Neutrophils Absolute: 5.5 10*3/uL (ref 1.4–7.0)
Neutrophils: 71 %
Platelets: 190 10*3/uL (ref 150–450)
RBC: 4.19 x10E6/uL (ref 3.77–5.28)
RDW: 14.7 % (ref 11.7–15.4)
WBC: 7.9 10*3/uL (ref 3.4–10.8)

## 2021-11-10 LAB — CMP14+EGFR
ALT: 12 IU/L (ref 0–32)
AST: 19 IU/L (ref 0–40)
Albumin/Globulin Ratio: 1.7 (ref 1.2–2.2)
Albumin: 5 g/dL — ABNORMAL HIGH (ref 3.9–4.9)
Alkaline Phosphatase: 93 IU/L (ref 44–121)
BUN/Creatinine Ratio: 12 (ref 12–28)
BUN: 24 mg/dL (ref 8–27)
Bilirubin Total: <0.2 mg/dL (ref 0.0–1.2)
CO2: 22 mmol/L (ref 20–29)
Calcium: 10.3 mg/dL (ref 8.7–10.3)
Chloride: 99 mmol/L (ref 96–106)
Creatinine, Ser: 2.04 mg/dL — ABNORMAL HIGH (ref 0.57–1.00)
Globulin, Total: 3 g/dL (ref 1.5–4.5)
Glucose: 90 mg/dL (ref 70–99)
Potassium: 6.6 mmol/L (ref 3.5–5.2)
Sodium: 135 mmol/L (ref 134–144)
Total Protein: 8 g/dL (ref 6.0–8.5)
eGFR: 27 mL/min/{1.73_m2} — ABNORMAL LOW (ref >59)

## 2021-11-10 LAB — HEMOGLOBIN A1C
Est. average glucose Bld gHb Est-mCnc: 128 mg/dL
Hgb A1c MFr Bld: 6.1 % — ABNORMAL HIGH (ref 4.8–5.6)

## 2021-11-10 LAB — LIPID PANEL
Chol/HDL Ratio: 2.1 ratio (ref 0.0–4.4)
Cholesterol, Total: 121 mg/dL (ref 100–199)
HDL: 58 mg/dL (ref >39)
LDL Chol Calc (NIH): 42 mg/dL (ref 0–99)
Triglycerides: 116 mg/dL (ref 0–149)
VLDL Cholesterol Cal: 21 mg/dL (ref 5–40)

## 2021-11-10 LAB — VITAMIN D 25 HYDROXY (VIT D DEFICIENCY, FRACTURES): Vit D, 25-Hydroxy: 70.8 ng/mL (ref 30.0–100.0)

## 2021-11-10 LAB — TSH: TSH: 1.49 u[IU]/mL (ref 0.450–4.500)

## 2021-11-10 MED ORDER — AMLODIPINE BESYLATE 10 MG PO TABS: 10.0000 mg | ORAL_TABLET | Freq: Every day | ORAL | 0 refills | Status: DC

## 2021-11-10 MED ORDER — LOKELMA 10 G PO PACK: 10.0000 g | PACK | Freq: Every day | ORAL | 0 refills | Status: AC

## 2021-11-10 NOTE — Addendum Note (Signed)
Addended byTrena Platt on: 11/10/2021 08:02 AM   Modules accepted: Orders

## 2021-11-19 LAB — BASIC METABOLIC PANEL
BUN/Creatinine Ratio: 18 (ref 12–28)
BUN: 28 mg/dL — ABNORMAL HIGH (ref 8–27)
CO2: 21 mmol/L (ref 20–29)
Calcium: 10.3 mg/dL (ref 8.7–10.3)
Chloride: 106 mmol/L (ref 96–106)
Creatinine, Ser: 1.57 mg/dL — ABNORMAL HIGH (ref 0.57–1.00)
Glucose: 105 mg/dL — ABNORMAL HIGH (ref 70–99)
Potassium: 4.9 mmol/L (ref 3.5–5.2)
Sodium: 138 mmol/L (ref 134–144)
eGFR: 37 mL/min/{1.73_m2} — ABNORMAL LOW (ref >59)

## 2021-11-20 ENCOUNTER — Encounter: Payer: Self-pay | Admitting: Internal Medicine

## 2021-11-20 ENCOUNTER — Ambulatory Visit: Payer: 59 | Admitting: Internal Medicine

## 2021-11-20 VITALS — BP 124/86 | HR 70 | Resp 18 | Ht 61.0 in | Wt 154.0 lb

## 2021-11-20 DIAGNOSIS — Z23 Encounter for immunization: Secondary | ICD-10-CM

## 2021-11-20 DIAGNOSIS — I10 Essential (primary) hypertension: Secondary | ICD-10-CM

## 2021-11-20 DIAGNOSIS — E875 Hyperkalemia: Secondary | ICD-10-CM | POA: Diagnosis not present

## 2021-11-20 DIAGNOSIS — N1832 Chronic kidney disease, stage 3b: Secondary | ICD-10-CM

## 2021-11-20 MED ORDER — AMLODIPINE BESYLATE 10 MG PO TABS: 10.0000 mg | ORAL_TABLET | Freq: Every day | ORAL | 5 refills | Status: DC

## 2021-11-20 NOTE — Progress Notes (Signed)
Acute Office Visit  Subjective:    Patient ID: Carol Maldonado, female    DOB: 1960-12-29, 61 y.o.   MRN: [id number]  Chief Complaint  Patient presents with   Follow-up    Follow up results from blood work     HPI Patient is in today for follow-up follow-up of AKI and hypokalemia.  Her last BMP showed an elevated serum creatinine, drop in GFR and elevated potassium - 6.6.  Of note, she had a cantaloupe on the day of blood test.  She was on olmesartan, but has been switched to higher dose of amlodipine for HTN now. BP is well-controlled. Takes medications regularly. Patient denies headache, dizziness, chest pain, dyspnea or palpitations.  Her repeat BMP showed improved kidney function and lower potassium - 4.9.  She has been trying to follow low-salt diet now.  Past Medical History:  Diagnosis Date   Allergy    Phreesia 02/29/2020   Anxiety    Phreesia 02/29/2020   Hyperlipidemia    Phreesia 02/29/2020   Hypertension    Phreesia 02/29/2020   Insomnia    Osteoporosis    Phreesia 02/29/2020   Seasonal allergies     Past Surgical History:  Procedure Laterality Date   COLONOSCOPY  03/27/2011   Dr. Gala Romney; entirely normal exam and recommended repeat in 10 years.   COLONOSCOPY WITH PROPOFOL N/A 08/28/2020   Procedure: COLONOSCOPY WITH PROPOFOL;  Surgeon: Daneil Dolin, MD;  Location: [facility];  Service: Endoscopy;  Laterality: N/A;  8:30am    Family History  Problem Relation Age of Onset   Hypertension Mother    Heart attack Mother    Pneumonia Maternal Grandmother    Other Maternal Grandfather        MVA   Hypertension Father    High Cholesterol Father    Irritable bowel syndrome Sister    Irritable bowel syndrome Sister    Colon cancer Neg Hx     Social History   Socioeconomic History   Marital status: Single    Spouse name: Not on file   Number of children: Not on file   Years of education: Not on file   Highest education level: Not on file   Occupational History   Not on file  Tobacco Use   Smoking status: Never   Smokeless tobacco: Never  Vaping Use   Vaping Use: Never used  Substance and Sexual Activity   Alcohol use: Never    Alcohol/week: 0.0 standard drinks of alcohol   Drug use: Never   Sexual activity: Not Currently    Birth control/protection: Post-menopausal  Other Topics Concern   Not on file  Social History Narrative   Not on file   Social Determinants of Health   Financial Resource Strain: Not on file  Food Insecurity: Not on file  Transportation Needs: Not on file  Physical Activity: Not on file  Stress: Not on file  Social Connections: Not on file  Intimate Partner Violence: Not on file    Outpatient Medications Prior to Visit  Medication Sig Dispense Refill   albuterol (VENTOLIN HFA) 108 (90 Base) MCG/ACT inhaler Inhale 2 puffs into the lungs as needed. (Patient taking differently: Inhale 2 puffs into the lungs every 6 (six) hours as needed for wheezing or shortness of breath.) 6.7 g 3   alendronate (FOSAMAX) 70 MG tablet Take 1 tablet (70 mg total) by mouth every 7 (seven) days. Take with a full glass of water on an empty  stomach. 4 tablet 11   ALPRAZolam (XANAX) 0.5 MG tablet TAKE 1 TABLET BY MOUTH AT BEDTIME. 30 tablet 3   azelastine (ASTELIN) 0.1 % nasal spray Place 1 spray into both nostrils 2 (two) times daily. Use in each nostril as directed 30 mL 1   Calcium Carb-Cholecalciferol (CALCIUM 600/VITAMIN D3 PO) Take 1 tablet by mouth daily.     cholecalciferol (VITAMIN D3) 25 MCG (1000 UNIT) tablet Take 1,000 Units by mouth daily.     desloratadine (CLARINEX) 5 MG tablet TAKE 1 TABLET ONCE DAILY. 30 tablet 0   flunisolide (NASALIDE) 25 MCG/ACT (0.025%) SOLN Place 2 sprays into the nose 2 (two) times daily.     ibuprofen (ADVIL,MOTRIN) 200 MG tablet Take 400 mg by mouth every 8 (eight) hours as needed for moderate pain.     Omega-3 Fatty Acids (FISH OIL) 1000 MG CAPS Take 1,000 mg by mouth  daily.     RABEprazole (ACIPHEX) 20 MG tablet TAKE 1 TABLET ONCE DAILY. 90 tablet 1   rosuvastatin (CRESTOR) 20 MG tablet Take 1 tablet (20 mg total) by mouth daily. 90 tablet 3   sertraline (ZOLOFT) 50 MG tablet Take 1 tablet (50 mg total) by mouth at bedtime. 90 tablet 3   amLODipine (NORVASC) 10 MG tablet Take 1 tablet (10 mg total) by mouth daily. 30 tablet 0   No facility-administered medications prior to visit.    Allergies  Allergen Reactions   Amoxicillin Hives, Itching and Rash   Codeine Hives, Itching and Rash   Hydrocodone Rash   Keflex [Cephalexin] Rash   Penicillins Rash   Sulfa Antibiotics Rash    Review of Systems  Constitutional:  Negative for chills and fever.  HENT:  Negative for congestion, sinus pressure, sinus pain and sore throat.   Eyes:  Negative for pain and discharge.  Respiratory:  Negative for cough and shortness of breath.   Cardiovascular:  Negative for chest pain and palpitations.  Gastrointestinal:  Negative for abdominal pain, constipation, diarrhea, nausea and vomiting.  Endocrine: Negative for polydipsia and polyuria.  Genitourinary:  Negative for dysuria and hematuria.  Musculoskeletal:  Negative for neck pain and neck stiffness.  Skin:  Negative for rash.  Neurological:  Negative for dizziness and weakness.  Psychiatric/Behavioral:  Positive for sleep disturbance. Negative for agitation and behavioral problems. The patient is nervous/anxious.        Objective:    Physical Exam Vitals reviewed.  Constitutional:      General: She is not in acute distress.    Appearance: She is not diaphoretic.  HENT:     Head: Normocephalic and atraumatic.     Nose: Nose normal. No congestion.     Mouth/Throat:     Mouth: Mucous membranes are moist.     Pharynx: No posterior oropharyngeal erythema.  Eyes:     General: No scleral icterus.    Extraocular Movements: Extraocular movements intact.  Cardiovascular:     Rate and Rhythm: Normal rate and  regular rhythm.     Pulses: Normal pulses.     Heart sounds: Normal heart sounds. No murmur heard. Pulmonary:     Breath sounds: Normal breath sounds. No wheezing or rales.  Musculoskeletal:     Cervical back: Neck supple. No tenderness.     Right lower leg: No edema.     Left lower leg: No edema.  Skin:    General: Skin is warm.     Findings: No rash.  Neurological:  General: No focal deficit present.     Mental Status: She is alert and oriented to person, place, and time.     Sensory: No sensory deficit.     Motor: No weakness.  Psychiatric:        Mood and Affect: Mood normal.        Behavior: Behavior normal.     BP 124/86 (BP Location: Right Arm, Patient Position: Sitting, Cuff Size: Normal)   Pulse 70   Resp 18   Ht 5\' 1"  (1.549 m)   Wt 154 lb (69.9 kg)   SpO2 99%   BMI 29.10 kg/m  Wt Readings from Last 3 Encounters:  11/20/21 154 lb (69.9 kg)  11/09/21 157 lb (71.2 kg)  03/19/21 152 lb 1.9 oz (69 kg)        Assessment & Plan:   Problem List Items Addressed This Visit       Cardiovascular and Mediastinum   Essential hypertension, benign - Primary    BP Readings from Last 1 Encounters:  11/20/21 124/86  Well-controlled with Amlodipine 10 mg QD now Stopped Olmesartan due to AKI and hyperkalemia Counseled for compliance with the medications Advised DASH diet and moderate exercise/walking, at least 150 mins/week      Relevant Medications   amLODipine (NORVASC) 10 MG tablet     Genitourinary   Stage 3b chronic kidney disease (HCC)    Last BMP reviewed, stable GFR now, around baseline Stopped ARB due to hyperkalemia Asymptomatic currently Has been referred to Nephrology        Other   Hyperkalemia    Resolved now with Lokelma Stopped ARB for now Advised to follow low salt, low potassium diet      Other Visit Diagnoses     Need for immunization against influenza       Relevant Orders   Flu Vaccine QUAD 63mo+IM (Fluarix, Fluzone & Alfiuria  Quad PF) (Completed)        Meds ordered this encounter  Medications   amLODipine (NORVASC) 10 MG tablet    Sig: Take 1 tablet (10 mg total) by mouth daily.    Dispense:  30 tablet    Refill:  5     Harlei Lehrmann 5mo, MD

## 2021-11-20 NOTE — Assessment & Plan Note (Signed)
BP Readings from Last 1 Encounters:  11/20/21 124/86   Well-controlled with Amlodipine 10 mg QD now Stopped Olmesartan due to AKI and hyperkalemia Counseled for compliance with the medications Advised DASH diet and moderate exercise/walking, at least 150 mins/week

## 2021-11-20 NOTE — Patient Instructions (Addendum)
Please continue taking Amlodipine as prescribed.  Please limit use of potassium-rich diet.  Please continue to follow low salt diet and perform moderate exercise/walking at least 150 mins/week.

## 2021-11-20 NOTE — Assessment & Plan Note (Signed)
Last BMP reviewed, stable GFR now, around baseline Stopped ARB due to hyperkalemia Asymptomatic currently Has been referred to Nephrology

## 2021-11-20 NOTE — Assessment & Plan Note (Signed)
Resolved now with Lokelma Stopped ARB for now Advised to follow low salt, low potassium diet

## 2021-11-26 ENCOUNTER — Other Ambulatory Visit: Payer: Self-pay | Admitting: Internal Medicine

## 2021-11-26 DIAGNOSIS — J302 Other seasonal allergic rhinitis: Secondary | ICD-10-CM

## 2021-11-27 MED ORDER — DESLORATADINE 5 MG PO TABS: 5.0000 mg | ORAL_TABLET | Freq: Every day | ORAL | 0 refills | Status: DC

## 2021-12-27 ENCOUNTER — Other Ambulatory Visit: Payer: Self-pay | Admitting: Internal Medicine

## 2021-12-27 DIAGNOSIS — J302 Other seasonal allergic rhinitis: Secondary | ICD-10-CM

## 2021-12-30 ENCOUNTER — Other Ambulatory Visit: Payer: Self-pay | Admitting: Internal Medicine

## 2021-12-30 DIAGNOSIS — J302 Other seasonal allergic rhinitis: Secondary | ICD-10-CM

## 2021-12-31 MED ORDER — DESLORATADINE 5 MG PO TABS: 5.0000 mg | ORAL_TABLET | Freq: Every day | ORAL | 0 refills | Status: DC

## 2022-01-09 ENCOUNTER — Other Ambulatory Visit: Payer: Self-pay | Admitting: Internal Medicine

## 2022-01-11 ENCOUNTER — Other Ambulatory Visit: Payer: Self-pay

## 2022-01-11 MED ORDER — FLUNISOLIDE 25 MCG/ACT (0.025%) NA SOLN
2.0000 | Freq: Two times a day (BID) | NASAL | 2 refills | Status: DC
  Filled 2022-01-11: qty 25, 30d supply, fill #0

## 2022-01-12 ENCOUNTER — Other Ambulatory Visit: Payer: Self-pay

## 2022-01-13 ENCOUNTER — Other Ambulatory Visit: Payer: Self-pay

## 2022-01-19 ENCOUNTER — Other Ambulatory Visit: Payer: Self-pay | Admitting: Family Medicine

## 2022-01-20 ENCOUNTER — Other Ambulatory Visit: Payer: Self-pay | Admitting: Family Medicine

## 2022-01-27 ENCOUNTER — Other Ambulatory Visit: Payer: Self-pay | Admitting: Internal Medicine

## 2022-01-28 ENCOUNTER — Other Ambulatory Visit: Payer: Self-pay | Admitting: Family Medicine

## 2022-01-28 MED ORDER — ALENDRONATE SODIUM 70 MG PO TABS: 70.0000 mg | ORAL_TABLET | ORAL | 0 refills | Status: DC

## 2022-02-01 ENCOUNTER — Other Ambulatory Visit: Payer: Self-pay | Admitting: Internal Medicine

## 2022-02-01 DIAGNOSIS — J302 Other seasonal allergic rhinitis: Secondary | ICD-10-CM

## 2022-02-02 MED ORDER — DESLORATADINE 5 MG PO TABS: 5.0000 mg | ORAL_TABLET | Freq: Every day | ORAL | 0 refills | Status: DC

## 2022-02-23 LAB — HM MAMMOGRAPHY

## 2022-02-25 ENCOUNTER — Other Ambulatory Visit: Payer: Self-pay | Admitting: Internal Medicine

## 2022-02-25 DIAGNOSIS — J302 Other seasonal allergic rhinitis: Secondary | ICD-10-CM

## 2022-02-26 MED ORDER — ALENDRONATE SODIUM 70 MG PO TABS: 70.0000 mg | ORAL_TABLET | ORAL | 0 refills | Status: DC

## 2022-02-26 MED ORDER — DESLORATADINE 5 MG PO TABS: 5.0000 mg | ORAL_TABLET | Freq: Every day | ORAL | 0 refills | Status: DC

## 2022-03-04 ENCOUNTER — Ambulatory Visit (INDEPENDENT_AMBULATORY_CARE_PROVIDER_SITE_OTHER): Payer: 59 | Admitting: Obstetrics & Gynecology

## 2022-03-04 ENCOUNTER — Encounter: Payer: Self-pay | Admitting: Obstetrics & Gynecology

## 2022-03-04 ENCOUNTER — Other Ambulatory Visit (HOSPITAL_COMMUNITY)
Admission: RE | Admit: 2022-03-04 | Discharge: 2022-03-04 | Disposition: A | Discharge status: Home / Self Care | Payer: 59 | Source: Ambulatory Visit | Attending: Obstetrics & Gynecology | Admitting: Obstetrics & Gynecology

## 2022-03-04 VITALS — BP 149/94 | HR 72 | Ht 61.0 in | Wt 153.0 lb

## 2022-03-04 DIAGNOSIS — Z01419 Encounter for gynecological examination (general) (routine) without abnormal findings: Secondary | ICD-10-CM

## 2022-03-04 NOTE — Progress Notes (Signed)
Subjective:     Carol Maldonado is a 61 y.o. female here for a routine exam.  No LMP recorded. Patient is postmenopausal. G0P0000 Birth Control Method:  menopausal Menstrual Calendar(currently): amenorrhea  Current complaints: none.   Current acute medical issues:  hypertension management   Recent Gynecologic History No LMP recorded. Patient is postmenopausal. Last Pap: 02/2016,  normal Last mammogram: 02/25/22 @ [facility] in [place],  normal  Past Medical History:  Diagnosis Date   Allergy    Phreesia 02/29/2020   Anxiety    Phreesia 02/29/2020   Hyperlipidemia    Phreesia 02/29/2020   Hypertension    Phreesia 02/29/2020   Insomnia    Osteoporosis    Phreesia 02/29/2020   Seasonal allergies     Past Surgical History:  Procedure Laterality Date   COLONOSCOPY  03/27/2011   Dr. Jena Gauss; entirely normal exam and recommended repeat in 10 years.   COLONOSCOPY WITH PROPOFOL N/A 08/28/2020   Procedure: COLONOSCOPY WITH PROPOFOL;  Surgeon: Corbin Ade, MD;  Location: [facility];  Service: Endoscopy;  Laterality: N/A;  8:30am    OB History     Gravida  0   Para  0   Term  0   Preterm  0   AB  0   Living  0      SAB  0   IAB  0   Ectopic  0   Multiple  0   Live Births  0           Social History   Socioeconomic History   Marital status: Single    Spouse name: Not on file   Number of children: Not on file   Years of education: Not on file   Highest education level: Not on file  Occupational History   Not on file  Tobacco Use   Smoking status: Never   Smokeless tobacco: Never  Vaping Use   Vaping Use: Never used  Substance and Sexual Activity   Alcohol use: Never    Alcohol/week: 0.0 standard drinks of alcohol   Drug use: Never   Sexual activity: Not Currently    Birth control/protection: Post-menopausal  Other Topics Concern   Not on file  Social History Narrative   Not on file   Social Determinants of Health   Financial  Resource Strain: Low Risk  (03/04/2022)   Overall Financial Resource Strain (CARDIA)    Difficulty of Paying Living Expenses: Not hard at all  Food Insecurity: No Food Insecurity (03/04/2022)   Hunger Vital Sign    Worried About Running Out of Food in the Last Year: Never true    Ran Out of Food in the Last Year: Never true  Transportation Needs: No Transportation Needs (03/04/2022)   PRAPARE - Administrator, Civil Service (Medical): No    Lack of Transportation (Non-Medical): No  Physical Activity: Insufficiently Active (03/04/2022)   Exercise Vital Sign    Days of Exercise per Week: 5 days    Minutes of Exercise per Session: 10 min  Stress: No Stress Concern Present (03/04/2022)   [employer] of Occupational Health - Occupational Stress Questionnaire    Feeling of Stress : Not at all  Social Connections: Moderately Integrated (03/04/2022)   Social Connection and Isolation Panel [NHANES]    Frequency of Communication with Friends and Family: More than three times a week    Frequency of Social Gatherings with Friends and Family: Once a week  Attends Religious Services: More than 4 times per year    Active Member of Clubs or Organizations: Yes    Attends Engineer, structural: More than 4 times per year    Marital Status: Never married    Family History  Problem Relation Age of Onset   Hypertension Mother    Heart attack Mother    Pneumonia Maternal Grandmother    Other Maternal Grandfather        MVA   Hypertension Father    High Cholesterol Father    Irritable bowel syndrome Sister    Irritable bowel syndrome Sister    Colon cancer Neg Hx      Current Outpatient Medications:    albuterol (VENTOLIN HFA) 108 (90 Base) MCG/ACT inhaler, Inhale 2 puffs into the lungs as needed. (Patient taking differently: Inhale 2 puffs into the lungs every 6 (six) hours as needed for wheezing or shortness of breath.), Disp: 6.7 g, Rfl: 3   alendronate (FOSAMAX) 70 MG  tablet, Take 1 tablet (70 mg total) by mouth once a week. Take with a full glass of water on an empty stomach., Disp: 4 tablet, Rfl: 0   ALPRAZolam (XANAX) 0.5 MG tablet, TAKE 1 TABLET BY MOUTH AT BEDTIME., Disp: 30 tablet, Rfl: 3   amlodipine-olmesartan (AZOR) 10-20 MG tablet, Take 1 tablet by mouth daily., Disp: , Rfl:    azelastine (ASTELIN) 0.1 % nasal spray, Place 1 spray into both nostrils 2 (two) times daily. Use in each nostril as directed, Disp: 30 mL, Rfl: 1   Calcium Carb-Cholecalciferol (CALCIUM 600/VITAMIN D3 PO), Take 1 tablet by mouth daily., Disp: , Rfl:    cholecalciferol (VITAMIN D3) 25 MCG (1000 UNIT) tablet, Take 1,000 Units by mouth daily., Disp: , Rfl:    desloratadine (CLARINEX) 5 MG tablet, Take 1 tablet (5 mg total) by mouth daily., Disp: 30 tablet, Rfl: 0   flunisolide (NASALIDE) 25 MCG/ACT (0.025%) SOLN, Place 2 sprays into the nose 2 (two) times daily., Disp: 25 mL, Rfl: 2   ibuprofen (ADVIL,MOTRIN) 200 MG tablet, Take 400 mg by mouth every 8 (eight) hours as needed for moderate pain., Disp: , Rfl:    Omega-3 Fatty Acids (FISH OIL) 1000 MG CAPS, Take 1,000 mg by mouth daily., Disp: , Rfl:    RABEprazole (ACIPHEX) 20 MG tablet, TAKE 1 TABLET ONCE DAILY., Disp: 90 tablet, Rfl: 1   rosuvastatin (CRESTOR) 20 MG tablet, Take 1 tablet (20 mg total) by mouth daily., Disp: 90 tablet, Rfl: 3   sertraline (ZOLOFT) 50 MG tablet, Take 1 tablet (50 mg total) by mouth at bedtime., Disp: 90 tablet, Rfl: 3  Review of Systems  Review of Systems  Constitutional: Negative for fever, chills, weight loss, malaise/fatigue and diaphoresis.  HENT: Negative for hearing loss, ear pain, nosebleeds, congestion, sore throat, neck pain, tinnitus and ear discharge.   Eyes: Negative for blurred vision, double vision, photophobia, pain, discharge and redness.  Respiratory: Negative for cough, hemoptysis, sputum production, shortness of breath, wheezing and stridor.   Cardiovascular: Negative for  chest pain, palpitations, orthopnea, claudication, leg swelling and PND.  Gastrointestinal: negative for abdominal pain. Negative for heartburn, nausea, vomiting, diarrhea, constipation, blood in stool and melena.  Genitourinary: Negative for dysuria, urgency, frequency, hematuria and flank pain.  Musculoskeletal: Negative for myalgias, back pain, joint pain and falls.  Skin: Negative for itching and rash.  Neurological: Negative for dizziness, tingling, tremors, sensory change, speech change, focal weakness, seizures, loss of consciousness, weakness and headaches.  Endo/Heme/Allergies: Negative for environmental allergies and polydipsia. Does not bruise/bleed easily.  Psychiatric/Behavioral: Negative for depression, suicidal ideas, hallucinations, memory loss and substance abuse. The patient is not nervous/anxious and does not have insomnia.        Objective:  Blood pressure (!) 149/94, pulse 72, height 5\' 1"  (1.549 m), weight 153 lb (69.4 kg).   Physical Exam  Vitals reviewed. Constitutional: She is oriented to person, place, and time. She appears well-developed and well-nourished.  HENT:  Head: Normocephalic and atraumatic.        Right Ear: External ear normal.  Left Ear: External ear normal.  Nose: Nose normal.  Mouth/Throat: Oropharynx is clear and moist.  Eyes: Conjunctivae and EOM are normal. Pupils are equal, round, and reactive to light. Right eye exhibits no discharge. Left eye exhibits no discharge. No scleral icterus.  Neck: Normal range of motion. Neck supple. No tracheal deviation present. No thyromegaly present.  Cardiovascular: Normal rate, regular rhythm, normal heart sounds and intact distal pulses.  Exam reveals no gallop and no friction rub.   No murmur heard. Respiratory: Effort normal and breath sounds normal. No respiratory distress. She has no wheezes. She has no rales. She exhibits no tenderness.  GI: Soft. Bowel sounds are normal. She exhibits no distension and  no mass. There is no tenderness. There is no rebound and no guarding.  Genitourinary:  Breasts no masses skin changes or nipple changes bilaterally      Vulva is normal without lesions Vagina is pink moist without discharge Cervix normal in appearance and pap is done Uterus is normal size shape and contour Adnexa is negative with normal sized ovaries   Musculoskeletal: Normal range of motion. She exhibits no edema and no tenderness.  Neurological: She is alert and oriented to person, place, and time. She has normal reflexes. She displays normal reflexes. No cranial nerve deficit. She exhibits normal muscle tone. Coordination normal.  Skin: Skin is warm and dry. No rash noted. No erythema. No pallor.  Psychiatric: She has a normal mood and affect. Her behavior is normal. Judgment and thought content normal.       Medications Ordered at today's visit: No orders of the defined types were placed in this encounter.   Other orders placed at today's visit: No orders of the defined types were placed in this encounter.     Assessment:    Normal Gyn exam.    Plan:    Follow up in: 3 years.     Return in about 3 years (around 03/04/2025), or if symptoms worsen or fail to improve, for yearly.

## 2022-03-04 NOTE — Addendum Note (Signed)
Addended by: Dereck Ligas on: 03/04/2022 09:22 AM   Modules accepted: Orders

## 2022-03-09 LAB — CYTOLOGY - PAP
Adequacy: ABSENT
Comment: NEGATIVE
Diagnosis: NEGATIVE
High risk HPV: NEGATIVE

## 2022-03-26 ENCOUNTER — Other Ambulatory Visit: Payer: Self-pay | Admitting: Internal Medicine

## 2022-03-26 DIAGNOSIS — J302 Other seasonal allergic rhinitis: Secondary | ICD-10-CM

## 2022-03-30 ENCOUNTER — Other Ambulatory Visit: Payer: Self-pay | Admitting: Internal Medicine

## 2022-03-30 DIAGNOSIS — K219 Gastro-esophageal reflux disease without esophagitis: Secondary | ICD-10-CM

## 2022-03-30 MED ORDER — DESLORATADINE 5 MG PO TABS: 5.0000 mg | ORAL_TABLET | Freq: Every day | ORAL | 0 refills | Status: DC

## 2022-03-30 MED ORDER — AMLODIPINE-OLMESARTAN 10-20 MG PO TABS: 1.0000 | ORAL_TABLET | Freq: Every day | ORAL | 0 refills | Status: DC

## 2022-03-30 MED ORDER — ALENDRONATE SODIUM 70 MG PO TABS: 70.0000 mg | ORAL_TABLET | ORAL | 0 refills | Status: DC

## 2022-04-25 ENCOUNTER — Other Ambulatory Visit: Payer: Self-pay | Admitting: Internal Medicine

## 2022-04-25 DIAGNOSIS — K219 Gastro-esophageal reflux disease without esophagitis: Secondary | ICD-10-CM

## 2022-04-25 DIAGNOSIS — J302 Other seasonal allergic rhinitis: Secondary | ICD-10-CM

## 2022-04-26 MED ORDER — DESLORATADINE 5 MG PO TABS: 5.0000 mg | ORAL_TABLET | Freq: Every day | ORAL | 0 refills | Status: DC

## 2022-04-26 MED ORDER — FLUNISOLIDE 25 MCG/ACT (0.025%) NA SOLN: 2.0000 | Freq: Two times a day (BID) | NASAL | 0 refills | Status: DC

## 2022-04-26 MED ORDER — AMLODIPINE-OLMESARTAN 10-20 MG PO TABS: 1.0000 | ORAL_TABLET | Freq: Every day | ORAL | 0 refills | Status: DC

## 2022-04-26 MED ORDER — RABEPRAZOLE SODIUM 20 MG PO TBEC: 20.0000 mg | DELAYED_RELEASE_TABLET | Freq: Every day | ORAL | 0 refills | Status: DC

## 2022-04-26 MED ORDER — ALENDRONATE SODIUM 70 MG PO TABS: 70.0000 mg | ORAL_TABLET | ORAL | 0 refills | Status: DC

## 2022-04-27 ENCOUNTER — Other Ambulatory Visit: Payer: Self-pay | Admitting: Internal Medicine

## 2022-05-13 ENCOUNTER — Ambulatory Visit: Payer: 59 | Admitting: Internal Medicine

## 2022-05-23 ENCOUNTER — Other Ambulatory Visit: Payer: Self-pay | Admitting: Internal Medicine

## 2022-05-23 DIAGNOSIS — K219 Gastro-esophageal reflux disease without esophagitis: Secondary | ICD-10-CM

## 2022-05-23 DIAGNOSIS — J302 Other seasonal allergic rhinitis: Secondary | ICD-10-CM

## 2022-05-24 MED ORDER — RABEPRAZOLE SODIUM 20 MG PO TBEC: 20.0000 mg | DELAYED_RELEASE_TABLET | Freq: Every day | ORAL | 0 refills | Status: DC

## 2022-05-24 MED ORDER — DESLORATADINE 5 MG PO TABS: 5.0000 mg | ORAL_TABLET | Freq: Every day | ORAL | 0 refills | Status: DC

## 2022-05-24 MED ORDER — AMLODIPINE-OLMESARTAN 10-20 MG PO TABS: 1.0000 | ORAL_TABLET | Freq: Every day | ORAL | 0 refills | Status: DC

## 2022-05-24 MED ORDER — FLUNISOLIDE 25 MCG/ACT (0.025%) NA SOLN: 2.0000 | Freq: Two times a day (BID) | NASAL | 0 refills | Status: DC

## 2022-05-24 MED ORDER — ALENDRONATE SODIUM 70 MG PO TABS: 70.0000 mg | ORAL_TABLET | ORAL | 0 refills | Status: DC

## 2022-06-03 ENCOUNTER — Other Ambulatory Visit: Payer: Self-pay | Admitting: Internal Medicine

## 2022-06-03 DIAGNOSIS — N1832 Chronic kidney disease, stage 3b: Secondary | ICD-10-CM

## 2022-06-03 DIAGNOSIS — R7303 Prediabetes: Secondary | ICD-10-CM

## 2022-06-03 DIAGNOSIS — F3341 Major depressive disorder, recurrent, in partial remission: Secondary | ICD-10-CM

## 2022-06-03 DIAGNOSIS — I10 Essential (primary) hypertension: Secondary | ICD-10-CM

## 2022-06-04 LAB — CMP14+EGFR
ALT: 10 IU/L (ref 0–32)
AST: 19 IU/L (ref 0–40)
Albumin/Globulin Ratio: 1.6 (ref 1.2–2.2)
Albumin: 4.7 g/dL (ref 3.9–4.9)
Alkaline Phosphatase: 102 IU/L (ref 44–121)
BUN/Creatinine Ratio: 13 (ref 12–28)
BUN: 20 mg/dL (ref 8–27)
Bilirubin Total: 0.2 mg/dL (ref 0.0–1.2)
CO2: 18 mmol/L — ABNORMAL LOW (ref 20–29)
Calcium: 9.7 mg/dL (ref 8.7–10.3)
Chloride: 106 mmol/L (ref 96–106)
Creatinine, Ser: 1.49 mg/dL — ABNORMAL HIGH (ref 0.57–1.00)
Globulin, Total: 2.9 g/dL (ref 1.5–4.5)
Glucose: 98 mg/dL (ref 70–99)
Potassium: 5 mmol/L (ref 3.5–5.2)
Sodium: 141 mmol/L (ref 134–144)
Total Protein: 7.6 g/dL (ref 6.0–8.5)
eGFR: 39 mL/min/{1.73_m2} — ABNORMAL LOW (ref >59)

## 2022-06-04 LAB — CBC WITH DIFFERENTIAL/PLATELET
Basophils Absolute: 0 10*3/uL (ref 0.0–0.2)
Basos: 0 %
EOS (ABSOLUTE): 0.1 10*3/uL (ref 0.0–0.4)
Eos: 1 %
Hematocrit: 32.2 % — ABNORMAL LOW (ref 34.0–46.6)
Hemoglobin: 10.3 g/dL — ABNORMAL LOW (ref 11.1–15.9)
Immature Grans (Abs): 0 10*3/uL (ref 0.0–0.1)
Immature Granulocytes: 0 %
Lymphocytes Absolute: 1.2 10*3/uL (ref 0.7–3.1)
Lymphs: 17 %
MCH: 26.6 pg (ref 26.6–33.0)
MCHC: 32 g/dL (ref 31.5–35.7)
MCV: 83 fL (ref 79–97)
Monocytes Absolute: 0.4 10*3/uL (ref 0.1–0.9)
Monocytes: 5 %
Neutrophils Absolute: 5.3 10*3/uL (ref 1.4–7.0)
Neutrophils: 77 %
Platelets: 197 10*3/uL (ref 150–450)
RBC: 3.87 x10E6/uL (ref 3.77–5.28)
RDW: 14.2 % (ref 11.7–15.4)
WBC: 7 10*3/uL (ref 3.4–10.8)

## 2022-06-04 LAB — HEMOGLOBIN A1C
Est. average glucose Bld gHb Est-mCnc: 131 mg/dL
Hgb A1c MFr Bld: 6.2 % — ABNORMAL HIGH (ref 4.8–5.6)

## 2022-06-10 ENCOUNTER — Encounter: Payer: Self-pay | Admitting: Internal Medicine

## 2022-06-10 ENCOUNTER — Ambulatory Visit: Payer: 59 | Admitting: Internal Medicine

## 2022-06-10 VITALS — BP 130/82 | HR 92 | Ht 61.0 in | Wt 150.6 lb

## 2022-06-10 DIAGNOSIS — F411 Generalized anxiety disorder: Secondary | ICD-10-CM

## 2022-06-10 DIAGNOSIS — F3341 Major depressive disorder, recurrent, in partial remission: Secondary | ICD-10-CM | POA: Diagnosis not present

## 2022-06-10 DIAGNOSIS — N1832 Chronic kidney disease, stage 3b: Secondary | ICD-10-CM

## 2022-06-10 DIAGNOSIS — I10 Essential (primary) hypertension: Secondary | ICD-10-CM | POA: Diagnosis not present

## 2022-06-10 DIAGNOSIS — M79652 Pain in left thigh: Secondary | ICD-10-CM | POA: Diagnosis not present

## 2022-06-10 DIAGNOSIS — D631 Anemia in chronic kidney disease: Secondary | ICD-10-CM

## 2022-06-10 MED ORDER — PREDNISONE 10 MG (21) PO TBPK: ORAL_TABLET | ORAL | 0 refills | Status: DC

## 2022-06-10 NOTE — Progress Notes (Signed)
Established Patient Office Visit  Subjective:  Patient ID: Carol Maldonado, female    DOB: Aug 14, 1960  Age: 62 y.o. MRN: [id number]  CC:  Chief Complaint  Patient presents with   Hypertension    Six month follow up for hypertension and CKD. Patient states when she is standing and walking her left thigh hurts and has stiffness    HPI Carol Maldonado is a 62 y.o. female with past medical history of HTN, GERD, osteoporosis, CKD stage IIIb, depression with insomnia and HLD who presents for f/u of her chronic medical conditions.  HTN: BP is well-controlled. Takes medications regularly. Patient denies headache, dizziness, chest pain, dyspnea or palpitations.  KD stage 3b: BMP reviewed from chart.  She denies any dysuria, hematuria, urinary hesitance or resistance.  She is seeing Nephrology.  She is on ARB and vitamin D supplements currently.  She takes alendronate for osteoporosis and tolerates it well.  She takes Rabeprazole for GERD. Denies any dysphagia or odynophagia.  She has been taking Zoloft for depression and takes Xanax at bedtime for insomnia.  Denies any anhedonia, SI or HI.   She reports left thigh pain for the last 2 weeks, since 03/01.  She denies any recent injury.  Her thigh pain is constant, worse with standing up and walking, and better with rest.  She has tried taking ibuprofen without much relief.  Denies any numbness or tingling of the LE.  She has a history of DDD of lumbar spine, but denies any back pain currently.  Denies hip or knee area pain currently.  Past Medical History:  Diagnosis Date   Allergy    Phreesia 02/29/2020   Anxiety    Phreesia 02/29/2020   Hyperlipidemia    Phreesia 02/29/2020   Hypertension    Phreesia 02/29/2020   Insomnia    Osteoporosis    Phreesia 02/29/2020   Seasonal allergies     Past Surgical History:  Procedure Laterality Date   COLONOSCOPY  03/27/2011   Dr. Gala Romney; entirely normal exam and recommended repeat in 10  years.   COLONOSCOPY WITH PROPOFOL N/A 08/28/2020   Procedure: COLONOSCOPY WITH PROPOFOL;  Surgeon: Daneil Dolin, MD;  [facility];  Service: Endoscopy;  Laterality: N/A;  8:30am    Family History  Problem Relation Age of Onset   Hypertension Mother    Heart attack Mother    Pneumonia Maternal Grandmother    Other Maternal Grandfather        MVA   Hypertension Father    High Cholesterol Father    Irritable bowel syndrome Sister    Irritable bowel syndrome Sister    Colon cancer Neg Hx     Social History   Socioeconomic History   Marital status: Single    Spouse name: Not on file   Number of children: Not on file   Years of education: Not on file   Highest education level: Not on file  Occupational History   Not on file  Tobacco Use   Smoking status: Never   Smokeless tobacco: Never  Vaping Use   Vaping Use: Never used  Substance and Sexual Activity   Alcohol use: Never    Alcohol/week: 0.0 standard drinks of alcohol   Drug use: Never   Sexual activity: Not Currently    Birth control/protection: Post-menopausal  Other Topics Concern   Not on file  Social History Narrative   Not on file   Social Determinants of Health   Financial  Resource Strain: Low Risk  (03/04/2022)   Overall Financial Resource Strain (CARDIA)    Difficulty of Paying Living Expenses: Not hard at all  Food Insecurity: No Food Insecurity (03/04/2022)   Hunger Vital Sign    Worried About Running Out of Food in the Last Year: Never true    Ran Out of Food in the Last Year: Never true  Transportation Needs: No Transportation Needs (03/04/2022)   PRAPARE - Hydrologist (Medical): No    Lack of Transportation (Non-Medical): No  Physical Activity: Insufficiently Active (03/04/2022)   Exercise Vital Sign    Days of Exercise per Week: 5 days    Minutes of Exercise per Session: 10 min  Stress: No Stress Concern Present (03/04/2022)   [place]    Feeling of Stress : Not at all  Social Connections: Moderately Integrated (03/04/2022)   Social Connection and Isolation Panel [NHANES]    Frequency of Communication with Friends and Family: More than three times a week    Frequency of Social Gatherings with Friends and Family: Once a week    Attends Religious Services: More than 4 times per year    Active Member of [employer] or Organizations: Yes    Attends Archivist Meetings: More than 4 times per year    Marital Status: Never married  Intimate Partner Violence: Not At Risk (03/04/2022)   Humiliation, Afraid, Rape, and Kick questionnaire    Fear of Current or Ex-Partner: No    Emotionally Abused: No    Physically Abused: No    Sexually Abused: No    Outpatient Medications Prior to Visit  Medication Sig Dispense Refill   albuterol (VENTOLIN HFA) 108 (90 Base) MCG/ACT inhaler Inhale 2 puffs into the lungs as needed. (Patient taking differently: Inhale 2 puffs into the lungs every 6 (six) hours as needed for wheezing or shortness of breath.) 6.7 g 3   alendronate (FOSAMAX) 70 MG tablet Take 1 tablet (70 mg total) by mouth once a week. Take with a full glass of water on an empty stomach. 4 tablet 0   ALPRAZolam (XANAX) 0.5 MG tablet TAKE 1 TABLET BY MOUTH AT BEDTIME. 30 tablet 3   amlodipine-olmesartan (AZOR) 10-20 MG tablet Take 1 tablet by mouth daily. 90 tablet 0   azelastine (ASTELIN) 0.1 % nasal spray Place 1 spray into both nostrils 2 (two) times daily. Use in each nostril as directed 30 mL 1   Calcium Carb-Cholecalciferol (CALCIUM 600/VITAMIN D3 PO) Take 1 tablet by mouth daily.     cholecalciferol (VITAMIN D3) 25 MCG (1000 UNIT) tablet Take 1,000 Units by mouth daily.     desloratadine (CLARINEX) 5 MG tablet Take 1 tablet (5 mg total) by mouth daily. 30 tablet 0   flunisolide (NASALIDE) 25 MCG/ACT (0.025%) SOLN Place 2 sprays into the nose 2 (two) times daily. 25 mL 0    Omega-3 Fatty Acids (FISH OIL) 1000 MG CAPS Take 1,000 mg by mouth daily.     RABEprazole (ACIPHEX) 20 MG tablet Take 1 tablet (20 mg total) by mouth daily. 30 tablet 0   rosuvastatin (CRESTOR) 20 MG tablet Take 1 tablet (20 mg total) by mouth daily. 90 tablet 3   sertraline (ZOLOFT) 50 MG tablet Take 1 tablet (50 mg total) by mouth at bedtime. 90 tablet 3   ibuprofen (ADVIL,MOTRIN) 200 MG tablet Take 400 mg by mouth every 8 (eight) hours as needed for  moderate pain.     No facility-administered medications prior to visit.    Allergies  Allergen Reactions   Amoxicillin Hives, Itching and Rash   Codeine Hives, Itching and Rash   Hydrocodone Rash   Keflex [Cephalexin] Rash   Penicillins Rash   Sulfa Antibiotics Rash    ROS Review of Systems  Constitutional:  Negative for chills and fever.  HENT:  Negative for congestion, sinus pressure, sinus pain and sore throat.   Eyes:  Negative for pain and discharge.  Respiratory:  Negative for cough and shortness of breath.   Cardiovascular:  Negative for chest pain and palpitations.  Gastrointestinal:  Negative for abdominal pain, constipation, diarrhea, nausea and vomiting.  Endocrine: Negative for polydipsia and polyuria.  Genitourinary:  Negative for dysuria and hematuria.  Musculoskeletal:  Negative for neck pain and neck stiffness.       Left thigh pain  Skin:  Negative for rash.  Neurological:  Negative for dizziness and weakness.  Psychiatric/Behavioral:  Positive for sleep disturbance. Negative for agitation and behavioral problems. The patient is nervous/anxious.       Objective:    Physical Exam Vitals reviewed.  Constitutional:      General: She is not in acute distress.    Appearance: She is not diaphoretic.  HENT:     Head: Normocephalic and atraumatic.     Nose: Nose normal. No congestion.     Mouth/Throat:     Mouth: Mucous membranes are moist.     Pharynx: No posterior oropharyngeal erythema.  Eyes:      General: No scleral icterus.    Extraocular Movements: Extraocular movements intact.  Cardiovascular:     Rate and Rhythm: Normal rate and regular rhythm.     Pulses: Normal pulses.     Heart sounds: Normal heart sounds. No murmur heard. Pulmonary:     Breath sounds: Normal breath sounds. No wheezing or rales.  Musculoskeletal:     Cervical back: Neck supple. No tenderness.     Left hip: No tenderness.     Left knee: Normal range of motion. No tenderness.     Right lower leg: No edema.     Left lower leg: No edema.  Skin:    General: Skin is warm.     Findings: No rash.  Neurological:     General: No focal deficit present.     Mental Status: She is alert and oriented to person, place, and time.     Sensory: No sensory deficit.     Motor: No weakness.  Psychiatric:        Mood and Affect: Mood normal.        Behavior: Behavior normal.     BP 130/82 (BP Location: Left Arm, Cuff Size: Normal)   Pulse 92   Ht '5\' 1"'$  (1.549 m)   Wt 150 lb 9.6 oz (68.3 kg)   SpO2 96%   BMI 28.46 kg/m  Wt Readings from Last 3 Encounters:  06/10/22 150 lb 9.6 oz (68.3 kg)  03/04/22 153 lb (69.4 kg)  11/20/21 154 lb (69.9 kg)    Lab Results  Component Value Date   TSH 1.490 11/09/2021   Lab Results  Component Value Date   WBC 7.0 06/03/2022   HGB 10.3 (L) 06/03/2022   HCT 32.2 (L) 06/03/2022   MCV 83 06/03/2022   PLT 197 06/03/2022   Lab Results  Component Value Date   NA 141 06/03/2022   K 5.0 06/03/2022   CO2  18 (L) 06/03/2022   GLUCOSE 98 06/03/2022   BUN 20 06/03/2022   CREATININE 1.49 (H) 06/03/2022   BILITOT 0.2 06/03/2022   ALKPHOS 102 06/03/2022   AST 19 06/03/2022   ALT 10 06/03/2022   PROT 7.6 06/03/2022   ALBUMIN 4.7 06/03/2022   CALCIUM 9.7 06/03/2022   EGFR 39 (L) 06/03/2022   Lab Results  Component Value Date   CHOL 121 11/09/2021   Lab Results  Component Value Date   HDL 58 11/09/2021   Lab Results  Component Value Date   LDLCALC 42 11/09/2021    Lab Results  Component Value Date   TRIG 116 11/09/2021   Lab Results  Component Value Date   CHOLHDL 2.1 11/09/2021   Lab Results  Component Value Date   HGBA1C 6.2 (H) 06/03/2022      Assessment & Plan:   Problem List Items Addressed This Visit       Cardiovascular and Mediastinum   Essential hypertension, benign - Primary    BP Readings from Last 1 Encounters:  06/10/22 130/82  Well-controlled with Amlodipine-Olmesartan 10-20 mg QD now Counseled for compliance with the medications Advised DASH diet and moderate exercise/walking, at least 150 mins/week        Genitourinary   Stage 3b chronic kidney disease ([place])    Last BMP reviewed, stable GFR now, around baseline Avoid nephrotoxic agents On ARB Followed by nephrology      Relevant Orders   CMP14+EGFR   Parathyroid hormone, intact (no Ca)   Urine Microalbumin w/creat. ratio     Other   Anemia    Likely due to CKD Denies any active signs of bleeding currently Started iron supplement recently      MDD (major depressive disorder), recurrent, in partial remission ([place])    Well controlled with Zoloft      Thigh pain, musculoskeletal, left    Left thigh area pain since 03/01, denies hip and knee pain currently Denies any recent injury Trial of oral prednisone for now If persistent pain, will refer to PM&R      Relevant Medications   predniSONE (STERAPRED UNI-PAK 21 TAB) 10 MG (21) TBPK tablet   GAD (generalized anxiety disorder)    Well-controlled with Zoloft and as needed Xanax       Meds ordered this encounter  Medications   predniSONE (STERAPRED UNI-PAK 21 TAB) 10 MG (21) TBPK tablet    Sig: Take as package instructions.    Dispense:  1 each    Refill:  0    Follow-up: Return in about 4 months (around 10/10/2022) for HTN and CKD.    Lindell Spar, MD

## 2022-06-10 NOTE — Patient Instructions (Signed)
Please take Prednisone as prescribed.  Please continue taking other medications as prescribed.  Please continue to follow low salt diet and ambulate as tolerated.  Please get blood tests done before the next visit.

## 2022-06-10 NOTE — Assessment & Plan Note (Deleted)
On alendronate since 2020 Takes calcium and vitamin D supplements

## 2022-06-10 NOTE — Assessment & Plan Note (Signed)
Well-controlled with Zoloft and as needed Xanax

## 2022-06-10 NOTE — Assessment & Plan Note (Signed)
Likely due to CKD Denies any active signs of bleeding currently Started iron supplement recently

## 2022-06-10 NOTE — Assessment & Plan Note (Signed)
Well-controlled with Zoloft 

## 2022-06-10 NOTE — Assessment & Plan Note (Addendum)
BP Readings from Last 1 Encounters:  06/10/22 130/82   Well-controlled with Amlodipine-Olmesartan 10-20 mg QD now Counseled for compliance with the medications Advised DASH diet and moderate exercise/walking, at least 150 mins/week

## 2022-06-10 NOTE — Assessment & Plan Note (Addendum)
Left thigh area pain since 03/01, denies hip and knee pain currently Denies any recent injury Trial of oral prednisone for now If persistent pain, will refer to PM&R

## 2022-06-10 NOTE — Assessment & Plan Note (Signed)
Last BMP reviewed, stable GFR now, around baseline Avoid nephrotoxic agents On ARB Followed by nephrology

## 2022-06-17 ENCOUNTER — Telehealth: Payer: Self-pay | Admitting: Internal Medicine

## 2022-06-17 ENCOUNTER — Other Ambulatory Visit: Payer: Self-pay | Admitting: Internal Medicine

## 2022-06-17 DIAGNOSIS — M5136 Other intervertebral disc degeneration, lumbar region: Secondary | ICD-10-CM

## 2022-06-17 DIAGNOSIS — M79652 Pain in left thigh: Secondary | ICD-10-CM

## 2022-06-17 MED ORDER — CYCLOBENZAPRINE HCL 5 MG PO TABS: 5.0000 mg | ORAL_TABLET | Freq: Two times a day (BID) | ORAL | 1 refills | Status: DC | PRN

## 2022-06-17 NOTE — Telephone Encounter (Signed)
Pt called stating that the predisone is not helping. Wants to know what to do?

## 2022-06-17 NOTE — Telephone Encounter (Signed)
lmtrc

## 2022-06-23 ENCOUNTER — Other Ambulatory Visit: Payer: Self-pay | Admitting: Internal Medicine

## 2022-06-23 DIAGNOSIS — K219 Gastro-esophageal reflux disease without esophagitis: Secondary | ICD-10-CM

## 2022-06-23 DIAGNOSIS — J302 Other seasonal allergic rhinitis: Secondary | ICD-10-CM

## 2022-06-23 MED ORDER — FLUNISOLIDE 25 MCG/ACT (0.025%) NA SOLN: 2.0000 | Freq: Two times a day (BID) | NASAL | 0 refills | Status: DC

## 2022-06-23 MED ORDER — RABEPRAZOLE SODIUM 20 MG PO TBEC: 20.0000 mg | DELAYED_RELEASE_TABLET | Freq: Every day | ORAL | 0 refills | Status: DC

## 2022-06-23 MED ORDER — ALENDRONATE SODIUM 70 MG PO TABS: 70.0000 mg | ORAL_TABLET | ORAL | 0 refills | Status: DC

## 2022-06-23 MED ORDER — DESLORATADINE 5 MG PO TABS: 5.0000 mg | ORAL_TABLET | Freq: Every day | ORAL | 0 refills | Status: DC

## 2022-06-23 MED ORDER — AMLODIPINE-OLMESARTAN 10-20 MG PO TABS: 1.0000 | ORAL_TABLET | Freq: Every day | ORAL | 0 refills | Status: DC

## 2022-06-30 ENCOUNTER — Other Ambulatory Visit (INDEPENDENT_AMBULATORY_CARE_PROVIDER_SITE_OTHER): Payer: 59

## 2022-06-30 ENCOUNTER — Ambulatory Visit (INDEPENDENT_AMBULATORY_CARE_PROVIDER_SITE_OTHER): Payer: 59 | Admitting: Orthopedic Surgery

## 2022-06-30 DIAGNOSIS — M79652 Pain in left thigh: Secondary | ICD-10-CM

## 2022-07-03 ENCOUNTER — Encounter: Payer: Self-pay | Admitting: Orthopedic Surgery

## 2022-07-03 NOTE — Progress Notes (Signed)
Office Visit Note   Patient: Carol Maldonado           Date of Birth: Dec 01, 1960           MRN: [id number] Visit Date: 06/30/2022 Requested by: Anabel HalonPatel, Rutwik K, MD [address] [place],  [place] [id number] PCP: Anabel HalonPatel, Rutwik K, MD  Subjective: Chief Complaint  Patient presents with   Left Leg - Pain    HPI: Carol Maldonado is a 62 y.o. female who presents to the office reporting left leg pain of several months duration.  Reports primarily thigh pain with occasional radiation from the back.  Does not report any symptoms going below the leg.  Denies any numbness and tingling.  Does describe some mild back pain.  Also describes occasional groin pain.  No history of trauma to that left leg.  Notably the patient has been on Fosamax for about 5 years..                ROS: All systems reviewed are negative as they relate to the chief complaint within the history of present illness.  Patient denies fevers or chills.  Assessment & Plan: Visit Diagnoses:  1. Left thigh pain     Plan: Impression is multiple month history of left thigh pain which could be radicular from the back or related to a stress reaction from Fosamax use.  Plan at this time is MRI imaging of both regions.  Follow-up after that study.  Follow-Up Instructions: No follow-ups on file.   Orders:  Orders Placed This Encounter  Procedures   XR FEMUR MIN 2 VIEWS LEFT   XR Lumbar Spine 2-3 Views   MR Lumbar Spine w/o contrast   No orders of the defined types were placed in this encounter.     Procedures: No procedures performed   Clinical Data: No additional findings.  Objective: Vital Signs: There were no vitals taken for this visit.  Physical Exam:  Constitutional: Patient appears well-developed HEENT:  Head: Normocephalic Eyes:EOM are normal Neck: Normal range of motion Cardiovascular: Normal rate Pulmonary/chest: Effort normal Neurologic: Patient is alert Skin: Skin is warm Psychiatric: Patient has  normal mood and affect  Ortho Exam: Ortho exam demonstrates no real groin pain with internal/external rotation of either leg.  No muscle atrophy in the lower extremities.  Pedal pulses palpable.  Mild pain with forward lateral bending.  No trochanteric tenderness is present on the left-hand side.  Has excellent hip flexion strength bilaterally.  No paresthesias L1 S1 bilaterally.  Reflexes symmetric 0 1+ out of 4 bilateral patella and Achilles.  Specialty Comments:  No specialty comments available.  Imaging: No results found.   PMFS History: Patient Active Problem List   Diagnosis Date Noted   Thigh pain, musculoskeletal, left 06/10/2022   GAD (generalized anxiety disorder) 06/10/2022   Hyperkalemia 11/20/2021   Encounter for general adult medical examination with abnormal findings 11/09/2021   Osteoporosis 03/19/2021   GERD (gastroesophageal reflux disease) 03/19/2021   HLD (hyperlipidemia) 03/19/2021   MDD (major depressive disorder), recurrent, in partial remission 03/19/2021   Anemia 11/12/2020   Stage 3b chronic kidney disease 08/05/2020   Colon cancer screening 06/23/2020   Insomnia, unspecified 03/03/2020   Essential hypertension, benign 03/03/2020   Seasonal allergic rhinitis 03/03/2020   Past Medical History:  Diagnosis Date   Allergy    Phreesia 02/29/2020   Anxiety    Phreesia 02/29/2020   Hyperlipidemia    Phreesia 02/29/2020   Hypertension  Phreesia 02/29/2020   Insomnia    Osteoporosis    Phreesia 02/29/2020   Seasonal allergies     Family History  Problem Relation Age of Onset   Hypertension Mother    Heart attack Mother    Pneumonia Maternal Grandmother    Other Maternal Grandfather        MVA   Hypertension Father    High Cholesterol Father    Irritable bowel syndrome Sister    Irritable bowel syndrome Sister    Colon cancer Neg Hx     Past Surgical History:  Procedure Laterality Date   COLONOSCOPY  03/27/2011   Dr. Jena Gauss; entirely normal  exam and recommended repeat in 10 years.   COLONOSCOPY WITH PROPOFOL N/A 08/28/2020   Procedure: COLONOSCOPY WITH PROPOFOL;  Surgeon: Corbin Ade, MD;  [facility];  Service: Endoscopy;  Laterality: N/A;  8:30am   Social History   Occupational History   Not on file  Tobacco Use   Smoking status: Never   Smokeless tobacco: Never  Vaping Use   Vaping Use: Never used  Substance and Sexual Activity   Alcohol use: Never    Alcohol/week: 0.0 standard drinks of alcohol   Drug use: Never   Sexual activity: Not Currently    Birth control/protection: Post-menopausal

## 2022-07-05 ENCOUNTER — Other Ambulatory Visit: Payer: Self-pay

## 2022-07-05 ENCOUNTER — Other Ambulatory Visit: Payer: Self-pay | Admitting: Internal Medicine

## 2022-07-05 DIAGNOSIS — M79652 Pain in left thigh: Secondary | ICD-10-CM

## 2022-07-05 DIAGNOSIS — J302 Other seasonal allergic rhinitis: Secondary | ICD-10-CM

## 2022-07-05 DIAGNOSIS — K219 Gastro-esophageal reflux disease without esophagitis: Secondary | ICD-10-CM

## 2022-07-25 ENCOUNTER — Ambulatory Visit
Admission: RE | Admit: 2022-07-25 | Discharge: 2022-07-25 | Disposition: A | Discharge status: Home / Self Care | Payer: 59 | Source: Ambulatory Visit | Attending: Orthopedic Surgery | Admitting: Orthopedic Surgery

## 2022-07-25 DIAGNOSIS — M79652 Pain in left thigh: Secondary | ICD-10-CM

## 2022-07-28 ENCOUNTER — Other Ambulatory Visit: Payer: Self-pay | Admitting: Internal Medicine

## 2022-07-28 DIAGNOSIS — E782 Mixed hyperlipidemia: Secondary | ICD-10-CM

## 2022-07-28 DIAGNOSIS — G4700 Insomnia, unspecified: Secondary | ICD-10-CM

## 2022-07-28 DIAGNOSIS — K219 Gastro-esophageal reflux disease without esophagitis: Secondary | ICD-10-CM

## 2022-07-28 DIAGNOSIS — J302 Other seasonal allergic rhinitis: Secondary | ICD-10-CM

## 2022-07-28 MED ORDER — AMLODIPINE-OLMESARTAN 10-20 MG PO TABS: 1.0000 | ORAL_TABLET | Freq: Every day | ORAL | 0 refills | Status: DC

## 2022-07-28 MED ORDER — ALENDRONATE SODIUM 70 MG PO TABS: 70.0000 mg | ORAL_TABLET | ORAL | 0 refills | Status: DC

## 2022-07-28 MED ORDER — DESLORATADINE 5 MG PO TABS
5.0000 mg | ORAL_TABLET | Freq: Every day | ORAL | 0 refills | Status: DC
Start: 2022-07-28 — End: 2022-08-29

## 2022-07-28 MED ORDER — RABEPRAZOLE SODIUM 20 MG PO TBEC
20.0000 mg | DELAYED_RELEASE_TABLET | Freq: Every day | ORAL | 0 refills | Status: DC
Start: 2022-07-28 — End: 2022-08-29

## 2022-07-28 MED ORDER — FLUNISOLIDE 25 MCG/ACT (0.025%) NA SOLN: 2.0000 | Freq: Two times a day (BID) | NASAL | 0 refills | Status: DC

## 2022-08-04 NOTE — Progress Notes (Signed)
Scan reviewed.  Stress reaction without discrete fracture present.  Should be okay to be evaluated on Friday and then we will set her up likely for nail fixation of fracture.

## 2022-08-06 ENCOUNTER — Encounter: Payer: Self-pay | Admitting: Orthopedic Surgery

## 2022-08-06 ENCOUNTER — Ambulatory Visit: Payer: 59 | Admitting: Orthopedic Surgery

## 2022-08-06 DIAGNOSIS — M84352S Stress fracture, left femur, sequela: Secondary | ICD-10-CM | POA: Diagnosis not present

## 2022-08-06 NOTE — Progress Notes (Signed)
Office Visit Note   Patient: Carol Maldonado           Date of Birth: [birth year]           MRN: [id number] Visit Date: 08/06/2022 Requested by: Anabel Halon, MD [address] [place],  [place] [address] PCP: Anabel Halon, MD  Subjective: Chief Complaint  Patient presents with   Left Leg - Follow-up    Review MRI    HPI: Carol Maldonado is a 62 y.o. female who presents to the office reporting left thigh pain.  Since she was last seen she had an MRI scan.  She has been on Fosamax for 5 years.  She does have a stress reaction but no discrete fracture in that left leg.  She has had pain for about 3 months.  She has to walk a fairly long distance to get to work.  The more she walks the worst pain she has.  She lives with her father at home.  She has never not been able to make it to 8/10 of a mile that she has to walk to get to work.  She does have pain towards the end.  She is otherwise healthy.  No personal or family history of DVT or pulmonary embolism..                ROS: All systems reviewed are negative as they relate to the chief complaint within the history of present illness.  Patient denies fevers or chills.  Assessment & Plan: Visit Diagnoses:  1. Stress fracture of femoral shaft, left, sequela     Plan: Impression stress reaction left femur associated with Fosamax use.  Plan is intramedullary nail with proximal and distal interlocks.  Risk and benefits are discussed.  Anticipate this could be an outpatient procedure.  Would like to get this fixed before stress reaction progresses to stress fracture.  Anticipate out of work time about 6 weeks.  All questions answered  Follow-Up Instructions: No follow-ups on file.   Orders:  No orders of the defined types were placed in this encounter.  No orders of the defined types were placed in this encounter.     Procedures: No procedures performed   Clinical Data: No additional findings.  Objective: Vital Signs:  There were no vitals taken for this visit.  Physical Exam:  Constitutional: Patient appears well-developed HEENT:  Head: Normocephalic Eyes:EOM are normal Neck: Normal range of motion Cardiovascular: Normal rate Pulmonary/chest: Effort normal Neurologic: Patient is alert Skin: Skin is warm Psychiatric: Patient has normal mood and affect  Ortho Exam: Ortho exam unchanged from prior visit.  She does have a little bit of pain with rotation of the leg but no pain with axial loading.  Hip flexion strength is 5+ out of 5 bilaterally.  Gait is normal today.  She is able to fully weight-bear on that left leg.  Specialty Comments:  No specialty comments available.  Imaging: No results found.   PMFS History: Patient Active Problem List   Diagnosis Date Noted   Thigh pain, musculoskeletal, left 06/10/2022   GAD (generalized anxiety disorder) 06/10/2022   Hyperkalemia 11/20/2021   Encounter for general adult medical examination with abnormal findings 11/09/2021   Osteoporosis 03/19/2021   GERD (gastroesophageal reflux disease) 03/19/2021   HLD (hyperlipidemia) 03/19/2021   MDD (major depressive disorder), recurrent, in partial remission ([facility]) 03/19/2021   Anemia 11/12/2020   Stage 3b chronic kidney disease ([facility]) 08/05/2020   Colon cancer  screening 06/23/2020   Insomnia, unspecified 03/03/2020   Essential hypertension, benign 03/03/2020   Seasonal allergic rhinitis 03/03/2020   Past Medical History:  Diagnosis Date   Allergy    Phreesia 02/29/2020   Anxiety    Phreesia 02/29/2020   Hyperlipidemia    Phreesia 02/29/2020   Hypertension    Phreesia 02/29/2020   Insomnia    Osteoporosis    Phreesia 02/29/2020   Seasonal allergies     Family History  Problem Relation Age of Onset   Hypertension Mother    Heart attack Mother    Pneumonia Maternal Grandmother    Other Maternal Grandfather        MVA   Hypertension Father    High Cholesterol Father    Irritable bowel  syndrome Sister    Irritable bowel syndrome Sister    Colon cancer Neg Hx     Past Surgical History:  Procedure Laterality Date   COLONOSCOPY  03/27/2011   Dr. Jena Gauss; entirely normal exam and recommended repeat in 10 years.   COLONOSCOPY WITH PROPOFOL N/A 08/28/2020   Procedure: COLONOSCOPY WITH PROPOFOL;  Surgeon: Corbin Ade, MD;  [facility];  Service: Endoscopy;  Laterality: N/A;  8:30am   Social History   Occupational History   Not on file  Tobacco Use   Smoking status: Never   Smokeless tobacco: Never  Vaping Use   Vaping Use: Never used  Substance and Sexual Activity   Alcohol use: Never    Alcohol/week: 0.0 standard drinks of alcohol   Drug use: Never   Sexual activity: Not Currently    Birth control/protection: Post-menopausal

## 2022-08-09 ENCOUNTER — Encounter (HOSPITAL_COMMUNITY): Payer: Self-pay | Admitting: Orthopedic Surgery

## 2022-08-09 ENCOUNTER — Other Ambulatory Visit: Payer: Self-pay

## 2022-08-09 NOTE — Progress Notes (Signed)
SDW call  Patient was given pre-op instructions over the phone. Patient verbalized understanding of instructions provided.    PCP - D. Petel Cardiologist - Denies Pulmonary: Denies   PPM/ICD - Denies  Chest x-ray - n/a EKG -  DOS 08/10/2022 Stress Test - ECHO -  Cardiac Cath -   Sleep Study/sleep apnea/CPAP: denies  Non-diabetic  Blood Thinner Instructions: Denies Aspirin Instructions:denies   ERAS Protcol - Yes, clear liquids 1230 PRE-SURGERY Ensure or G2-    COVID TEST- n/a    Anesthesia review: No   Patient denies shortness of breath, fever, cough and chest pain over the phone call  Your procedure is scheduled on Tuesday 5/14/20204  Report to [facility] Main Entrance "A" at  1  P.M., then check in with the Admitting office.  Call this number if you have problems the morning of surgery:  [phone]   If you have any questions prior to your surgery date call [phone]: Open Monday-Friday 8am-4pm If you experience any cold or flu symptoms such as cough, fever, chills, shortness of breath, etc. between now and your scheduled surgery, please notify us at the above number     Remember:  Do not eat after midnight the night before your surgery  You may drink clear liquids until 1230  the afternoon of your surgery.   Clear liquids allowed are: Water, Non-Citrus Juices (without pulp), Carbonated Beverages, Clear Tea, Black Coffee ONLY (NO MILK, CREAM OR POWDERED CREAMER of any kind), and Gatorade   Take these medicines the morning of surgery with A SIP OF WATER: Astelin, clarinex, NaSalide,nose spray,   As needed: Albuterol flexeril  As of today, STOP taking any Aspirin (unless otherwise instructed by your surgeon) Aleve, Naproxen, Ibuprofen, Motrin, Advil, Goody's, BC's, all herbal medications, fish oil, and all vitamins.

## 2022-08-10 ENCOUNTER — Ambulatory Visit (HOSPITAL_COMMUNITY): Payer: 59

## 2022-08-10 ENCOUNTER — Ambulatory Visit (HOSPITAL_BASED_OUTPATIENT_CLINIC_OR_DEPARTMENT_OTHER): Payer: 59

## 2022-08-10 ENCOUNTER — Other Ambulatory Visit: Payer: Self-pay

## 2022-08-10 ENCOUNTER — Encounter (HOSPITAL_COMMUNITY)
Admission: RE | Disposition: A | Discharge status: Home / Self Care | Payer: Self-pay | Source: Home / Self Care | Attending: Orthopedic Surgery

## 2022-08-10 ENCOUNTER — Ambulatory Visit (HOSPITAL_COMMUNITY)
Admission: RE | Admit: 2022-08-10 | Discharge: 2022-08-10 | Disposition: A | Discharge status: Home / Self Care | Payer: 59 | Attending: Orthopedic Surgery | Admitting: Orthopedic Surgery

## 2022-08-10 ENCOUNTER — Encounter (HOSPITAL_COMMUNITY): Payer: Self-pay | Admitting: Orthopedic Surgery

## 2022-08-10 DIAGNOSIS — Z7983 Long term (current) use of bisphosphonates: Secondary | ICD-10-CM | POA: Insufficient documentation

## 2022-08-10 DIAGNOSIS — K219 Gastro-esophageal reflux disease without esophagitis: Secondary | ICD-10-CM | POA: Diagnosis not present

## 2022-08-10 DIAGNOSIS — S7226XG Nondisplaced subtrochanteric fracture of unspecified femur, subsequent encounter for closed fracture with delayed healing: Secondary | ICD-10-CM

## 2022-08-10 DIAGNOSIS — S7222XA Displaced subtrochanteric fracture of left femur, initial encounter for closed fracture: Secondary | ICD-10-CM | POA: Diagnosis not present

## 2022-08-10 DIAGNOSIS — I10 Essential (primary) hypertension: Secondary | ICD-10-CM | POA: Diagnosis not present

## 2022-08-10 DIAGNOSIS — Z79899 Other long term (current) drug therapy: Secondary | ICD-10-CM | POA: Diagnosis not present

## 2022-08-10 DIAGNOSIS — S7225XG Nondisplaced subtrochanteric fracture of left femur, subsequent encounter for closed fracture with delayed healing: Secondary | ICD-10-CM | POA: Diagnosis not present

## 2022-08-10 DIAGNOSIS — F419 Anxiety disorder, unspecified: Secondary | ICD-10-CM | POA: Diagnosis not present

## 2022-08-10 DIAGNOSIS — F418 Other specified anxiety disorders: Secondary | ICD-10-CM

## 2022-08-10 DIAGNOSIS — X58XXXA Exposure to other specified factors, initial encounter: Secondary | ICD-10-CM | POA: Diagnosis not present

## 2022-08-10 DIAGNOSIS — F32A Depression, unspecified: Secondary | ICD-10-CM | POA: Diagnosis not present

## 2022-08-10 DIAGNOSIS — M84352A Stress fracture, left femur, initial encounter for fracture: Secondary | ICD-10-CM | POA: Diagnosis present

## 2022-08-10 DIAGNOSIS — Z01818 Encounter for other preprocedural examination: Secondary | ICD-10-CM

## 2022-08-10 HISTORY — PX: FEMUR IM NAIL: SHX1597

## 2022-08-10 LAB — BASIC METABOLIC PANEL
Anion gap: 8 (ref 5–15)
BUN: 23 mg/dL (ref 8–23)
CO2: 24 mmol/L (ref 22–32)
Calcium: 9.3 mg/dL (ref 8.9–10.3)
Chloride: 105 mmol/L (ref 98–111)
Creatinine, Ser: 1.66 mg/dL — ABNORMAL HIGH (ref 0.44–1.00)
GFR, Estimated: 34 mL/min — ABNORMAL LOW (ref >60)
Glucose, Bld: 105 mg/dL — ABNORMAL HIGH (ref 70–99)
Potassium: 4.9 mmol/L (ref 3.5–5.1)
Sodium: 137 mmol/L (ref 135–145)

## 2022-08-10 LAB — CBC
HCT: 34.3 % — ABNORMAL LOW (ref 36.0–46.0)
Hemoglobin: 10.7 g/dL — ABNORMAL LOW (ref 12.0–15.0)
MCH: 26.5 pg (ref 26.0–34.0)
MCHC: 31.2 g/dL (ref 30.0–36.0)
MCV: 84.9 fL (ref 80.0–100.0)
Platelets: 185 10*3/uL (ref 150–400)
RBC: 4.04 MIL/uL (ref 3.87–5.11)
RDW: 14.4 % (ref 11.5–15.5)
WBC: 6.8 10*3/uL (ref 4.0–10.5)
nRBC: 0 % (ref 0.0–0.2)

## 2022-08-10 SURGERY — INSERTION, INTRAMEDULLARY ROD, FEMUR
Anesthesia: General | Site: Leg Upper | Laterality: Left

## 2022-08-10 MED ORDER — EPHEDRINE SULFATE-NACL 50-0.9 MG/10ML-% IV SOSY
PREFILLED_SYRINGE | INTRAVENOUS | Status: DC | PRN
  Administered 2022-08-10: 10 mg via INTRAVENOUS

## 2022-08-10 MED ORDER — FENTANYL CITRATE (PF) 250 MCG/5ML IJ SOLN
INTRAMUSCULAR | Status: DC | PRN
  Administered 2022-08-10: 75 ug via INTRAVENOUS
  Administered 2022-08-10: 50 ug via INTRAVENOUS
  Administered 2022-08-10: 25 ug via INTRAVENOUS

## 2022-08-10 MED ORDER — ONDANSETRON HCL 4 MG/2ML IJ SOLN
INTRAMUSCULAR | Status: AC
  Filled 2022-08-10: qty 2

## 2022-08-10 MED ORDER — DEXAMETHASONE SODIUM PHOSPHATE 10 MG/ML IJ SOLN
INTRAMUSCULAR | Status: DC | PRN
  Administered 2022-08-10: 10 mg via INTRAVENOUS

## 2022-08-10 MED ORDER — CLONIDINE HCL (ANALGESIA) 100 MCG/ML EP SOLN
EPIDURAL | Status: AC
  Filled 2022-08-10: qty 10

## 2022-08-10 MED ORDER — ASPIRIN 81 MG PO CHEW: 81.0000 mg | CHEWABLE_TABLET | Freq: Two times a day (BID) | ORAL | 0 refills | Status: AC

## 2022-08-10 MED ORDER — IBUPROFEN 800 MG PO TABS: 800.0000 mg | ORAL_TABLET | Freq: Three times a day (TID) | ORAL | 0 refills | Status: DC | PRN

## 2022-08-10 MED ORDER — ACETAMINOPHEN 500 MG PO TABS
1000.0000 mg | ORAL_TABLET | Freq: Once | ORAL | Status: AC
  Administered 2022-08-10: 1000 mg via ORAL

## 2022-08-10 MED ORDER — OXYCODONE HCL 5 MG/5ML PO SOLN
ORAL | Status: AC
  Filled 2022-08-10: qty 5

## 2022-08-10 MED ORDER — AMISULPRIDE (ANTIEMETIC) 5 MG/2ML IV SOLN: 10.0000 mg | Freq: Once | INTRAVENOUS | Status: DC | PRN

## 2022-08-10 MED ORDER — LIDOCAINE 2% (20 MG/ML) 5 ML SYRINGE
INTRAMUSCULAR | Status: AC
  Filled 2022-08-10: qty 5

## 2022-08-10 MED ORDER — ORAL CARE MOUTH RINSE: 15.0000 mL | Freq: Once | OROMUCOSAL | Status: AC

## 2022-08-10 MED ORDER — FENTANYL CITRATE (PF) 250 MCG/5ML IJ SOLN
INTRAMUSCULAR | Status: AC
  Filled 2022-08-10: qty 5

## 2022-08-10 MED ORDER — CHLORHEXIDINE GLUCONATE 0.12 % MT SOLN
15.0000 mL | Freq: Once | OROMUCOSAL | Status: AC
  Administered 2022-08-10: 15 mL via OROMUCOSAL
  Filled 2022-08-10: qty 15

## 2022-08-10 MED ORDER — MORPHINE SULFATE 4 MG/ML IJ SOLN
INTRAMUSCULAR | Status: DC | PRN
  Administered 2022-08-10: 33 mL

## 2022-08-10 MED ORDER — OXYCODONE HCL 5 MG PO TABS: 5.0000 mg | ORAL_TABLET | Freq: Once | ORAL | Status: AC | PRN

## 2022-08-10 MED ORDER — CEFAZOLIN SODIUM-DEXTROSE 2-4 GM/100ML-% IV SOLN
2.0000 g | INTRAVENOUS | Status: AC
  Administered 2022-08-10: 2 g via INTRAVENOUS
  Filled 2022-08-10: qty 100

## 2022-08-10 MED ORDER — MIDAZOLAM HCL 2 MG/2ML IJ SOLN
INTRAMUSCULAR | Status: DC | PRN
  Administered 2022-08-10: 2 mg via INTRAVENOUS

## 2022-08-10 MED ORDER — POVIDONE-IODINE 7.5 % EX SOLN
Freq: Once | CUTANEOUS | Status: DC
  Filled 2022-08-10: qty 118

## 2022-08-10 MED ORDER — PROPOFOL 10 MG/ML IV BOLUS
INTRAVENOUS | Status: AC
  Filled 2022-08-10: qty 20

## 2022-08-10 MED ORDER — PHENYLEPHRINE 80 MCG/ML (10ML) SYRINGE FOR IV PUSH (FOR BLOOD PRESSURE SUPPORT)
PREFILLED_SYRINGE | INTRAVENOUS | Status: AC
  Filled 2022-08-10: qty 10

## 2022-08-10 MED ORDER — OXYCODONE HCL 5 MG PO TABS: 5.0000 mg | ORAL_TABLET | ORAL | 0 refills | Status: DC | PRN

## 2022-08-10 MED ORDER — MIDAZOLAM HCL 2 MG/2ML IJ SOLN
INTRAMUSCULAR | Status: AC
  Filled 2022-08-10: qty 2

## 2022-08-10 MED ORDER — TRANEXAMIC ACID-NACL 1000-0.7 MG/100ML-% IV SOLN
1000.0000 mg | INTRAVENOUS | Status: AC
  Administered 2022-08-10: 1000 mg via INTRAVENOUS
  Filled 2022-08-10: qty 100

## 2022-08-10 MED ORDER — ROCURONIUM BROMIDE 10 MG/ML (PF) SYRINGE
PREFILLED_SYRINGE | INTRAVENOUS | Status: DC | PRN
  Administered 2022-08-10: 50 mg via INTRAVENOUS

## 2022-08-10 MED ORDER — POVIDONE-IODINE 10 % EX SWAB
2.0000 | Freq: Once | CUTANEOUS | Status: AC
  Administered 2022-08-10: 2 via TOPICAL

## 2022-08-10 MED ORDER — ACETAMINOPHEN 500 MG PO TABS
ORAL_TABLET | ORAL | Status: AC
  Filled 2022-08-10: qty 2

## 2022-08-10 MED ORDER — ROCURONIUM BROMIDE 10 MG/ML (PF) SYRINGE
PREFILLED_SYRINGE | INTRAVENOUS | Status: AC
  Filled 2022-08-10: qty 10

## 2022-08-10 MED ORDER — ONDANSETRON HCL 4 MG/2ML IJ SOLN
INTRAMUSCULAR | Status: DC | PRN
  Administered 2022-08-10: 4 mg via INTRAVENOUS

## 2022-08-10 MED ORDER — FENTANYL CITRATE (PF) 100 MCG/2ML IJ SOLN: 25.0000 ug | INTRAMUSCULAR | Status: DC | PRN

## 2022-08-10 MED ORDER — LIDOCAINE 2% (20 MG/ML) 5 ML SYRINGE
INTRAMUSCULAR | Status: DC | PRN
  Administered 2022-08-10: 100 mg via INTRAVENOUS

## 2022-08-10 MED ORDER — PHENYLEPHRINE 80 MCG/ML (10ML) SYRINGE FOR IV PUSH (FOR BLOOD PRESSURE SUPPORT)
PREFILLED_SYRINGE | INTRAVENOUS | Status: DC | PRN
  Administered 2022-08-10: 80 ug via INTRAVENOUS
  Administered 2022-08-10 (×2): 160 ug via INTRAVENOUS

## 2022-08-10 MED ORDER — 0.9 % SODIUM CHLORIDE (POUR BTL) OPTIME
TOPICAL | Status: DC | PRN
  Administered 2022-08-10: 1000 mL

## 2022-08-10 MED ORDER — SUCCINYLCHOLINE CHLORIDE 200 MG/10ML IV SOSY
PREFILLED_SYRINGE | INTRAVENOUS | Status: AC
  Filled 2022-08-10: qty 10

## 2022-08-10 MED ORDER — DEXAMETHASONE SODIUM PHOSPHATE 10 MG/ML IJ SOLN
INTRAMUSCULAR | Status: AC
  Filled 2022-08-10: qty 1

## 2022-08-10 MED ORDER — OXYCODONE HCL 5 MG/5ML PO SOLN
5.0000 mg | Freq: Once | ORAL | Status: AC | PRN
  Administered 2022-08-10: 5 mg via ORAL

## 2022-08-10 MED ORDER — PROPOFOL 10 MG/ML IV BOLUS
INTRAVENOUS | Status: DC | PRN
  Administered 2022-08-10: 150 mg via INTRAVENOUS
  Administered 2022-08-10: 50 mg via INTRAVENOUS

## 2022-08-10 MED ORDER — EPHEDRINE 5 MG/ML INJ
INTRAVENOUS | Status: AC
  Filled 2022-08-10: qty 5

## 2022-08-10 MED ORDER — MORPHINE SULFATE (PF) 4 MG/ML IV SOLN
INTRAVENOUS | Status: AC
  Filled 2022-08-10: qty 2

## 2022-08-10 MED ORDER — LACTATED RINGERS IV SOLN: INTRAVENOUS | Status: DC

## 2022-08-10 MED ORDER — BUPIVACAINE HCL (PF) 0.5 % IJ SOLN
INTRAMUSCULAR | Status: AC
  Filled 2022-08-10: qty 30

## 2022-08-10 SURGICAL SUPPLY — 58 items
BAG COUNTER SPONGE SURGICOUNT (BAG) ×1 IMPLANT
BAG SPNG CNTER NS LX DISP (BAG)
BIT DRILL LAG SCREW META-TAN (DRILL) IMPLANT
BIT DRILL LONG 4.0 (BIT) IMPLANT
BIT DRILL SHORT 4.0 (BIT) IMPLANT
BLADE CLIPPER SURG (BLADE) IMPLANT
BLADE SURG 15 STRL LF DISP TIS (BLADE) ×1 IMPLANT
BLADE SURG 15 STRL SS (BLADE) ×1
COVER PERINEAL POST (MISCELLANEOUS) ×1 IMPLANT
COVER SURGICAL LIGHT HANDLE (MISCELLANEOUS) ×1 IMPLANT
DRAPE HALF SHEET 40X57 (DRAPES) IMPLANT
DRAPE ORTHO SPLIT 77X108 STRL (DRAPES)
DRAPE STERI IOBAN 125X83 (DRAPES) ×1 IMPLANT
DRAPE SURG ORHT 6 SPLT 77X108 (DRAPES) IMPLANT
DRESSING MEPILEX FLEX 4X4 (GAUZE/BANDAGES/DRESSINGS) IMPLANT
DRILL BIT LONG 4.0 (BIT) ×1
DRILL BIT SHORT 4.0 (BIT) ×1
DRILL LAG SCREW META-TAN (DRILL) ×1
DRSG AQUACEL AG ADV 3.5X 4 (GAUZE/BANDAGES/DRESSINGS) IMPLANT
DRSG AQUACEL AG ADV 3.5X 6 (GAUZE/BANDAGES/DRESSINGS) IMPLANT
DRSG MEPILEX FLEX 4X4 (GAUZE/BANDAGES/DRESSINGS)
DRSG MEPILEX POST OP 4X8 (GAUZE/BANDAGES/DRESSINGS) ×1 IMPLANT
DURAPREP 26ML APPLICATOR (WOUND CARE) ×1 IMPLANT
ELECT REM PT RETURN 9FT ADLT (ELECTROSURGICAL) ×1
ELECTRODE REM PT RTRN 9FT ADLT (ELECTROSURGICAL) ×1 IMPLANT
FACESHIELD WRAPAROUND (MASK) IMPLANT
FACESHIELD WRAPAROUND OR TEAM (MASK) ×1 IMPLANT
GAUZE XEROFORM 5X9 LF (GAUZE/BANDAGES/DRESSINGS) ×1 IMPLANT
GLOVE BIOGEL PI IND STRL 8 (GLOVE) ×1 IMPLANT
GLOVE ECLIPSE 8.0 STRL XLNG CF (GLOVE) ×1 IMPLANT
GOWN STRL REUS W/ TWL LRG LVL3 (GOWN DISPOSABLE) ×1 IMPLANT
GOWN STRL REUS W/ TWL XL LVL3 (GOWN DISPOSABLE) IMPLANT
GOWN STRL REUS W/TWL LRG LVL3 (GOWN DISPOSABLE) ×1
GOWN STRL REUS W/TWL XL LVL3 (GOWN DISPOSABLE)
GUIDE ROD 3.0 (MISCELLANEOUS) ×1
KIT BASIN OR (CUSTOM PROCEDURE TRAY) ×1 IMPLANT
KIT TURNOVER KIT B (KITS) ×1 IMPLANT
MANIFOLD NEPTUNE II (INSTRUMENTS) ×1 IMPLANT
NAIL TRIGEN TROCH META 10X34 (Nail) IMPLANT
NS IRRIG 1000ML POUR BTL (IV SOLUTION) ×1 IMPLANT
PACK GENERAL/GYN (CUSTOM PROCEDURE TRAY) ×1 IMPLANT
PAD ARMBOARD 7.5X6 YLW CONV (MISCELLANEOUS) ×2 IMPLANT
ROD GUIDE 3.0 (MISCELLANEOUS) IMPLANT
SCREW TRIGEN LOW PROF 5.0X35 (Screw) IMPLANT
SCREW TRIGEN LOW PROF 5.0X60 (Screw) IMPLANT
SPONGE T-LAP 4X18 ~~LOC~~+RFID (SPONGE) IMPLANT
STAPLER VISISTAT 35W (STAPLE) ×1 IMPLANT
SUT ETHILON 2 0 FS 18 (SUTURE) IMPLANT
SUT ETHILON 3 0 PS 1 (SUTURE) IMPLANT
SUT VIC AB 0 CT1 27 (SUTURE) ×3
SUT VIC AB 0 CT1 27XBRD ANBCTR (SUTURE) IMPLANT
SUT VIC AB 1 CT1 27 (SUTURE) ×3
SUT VIC AB 1 CT1 27XBRD ANBCTR (SUTURE) IMPLANT
SUT VIC AB 2-0 CTB1 (SUTURE) ×1 IMPLANT
TAPE STRIPS DRAPE STRL (GAUZE/BANDAGES/DRESSINGS) IMPLANT
TOWEL GREEN STERILE (TOWEL DISPOSABLE) ×1 IMPLANT
TOWEL GREEN STERILE FF (TOWEL DISPOSABLE) ×1 IMPLANT
WATER STERILE IRR 1000ML POUR (IV SOLUTION) ×1 IMPLANT

## 2022-08-10 NOTE — H&P (Signed)
Carol Maldonado is an 62 y.o. female.   Chief Complaint: Left leg pain HPI: Carol Maldonado is a 62 y.o. female who presentse reporting left thigh pain.  Since she was last seen she had an MRI scan.  She has been on Fosamax for 5 years.  She does have a stress reaction but no discrete fracture in that left leg.  She has had pain for about 3 months.  She has to walk a fairly long distance to get to work.  The more she walks the worst pain she has.  She lives with her father at home.  She has never not been able to make it to 8/10 of a mile that she has to walk to get to work.  She does have pain towards the endof the walk. .  She is otherwise healthy.  No personal or family history of DVT or pulmonary embolism.   Past Medical History:  Diagnosis Date   Allergy    Phreesia 02/29/2020   Anxiety    Phreesia 02/29/2020   Hyperlipidemia    Phreesia 02/29/2020   Hypertension    Phreesia 02/29/2020   Insomnia    Osteoporosis    Phreesia 02/29/2020   Seasonal allergies     Past Surgical History:  Procedure Laterality Date   COLONOSCOPY  03/27/2011   Dr. Jena Gauss; entirely normal exam and recommended repeat in 10 years.   COLONOSCOPY WITH PROPOFOL N/A 08/28/2020   Procedure: COLONOSCOPY WITH PROPOFOL;  Surgeon: Corbin Ade, MD;  Location: [facility];  Service: Endoscopy;  Laterality: N/A;  8:30am    Family History  Problem Relation Age of Onset   Hypertension Mother    Heart attack Mother    Pneumonia Maternal Grandmother    Other Maternal Grandfather        MVA   Hypertension Father    High Cholesterol Father    Irritable bowel syndrome Sister    Irritable bowel syndrome Sister    Colon cancer Neg Hx    Social History:  reports that she has never smoked. She has never used smokeless tobacco. She reports that she does not drink alcohol and does not use drugs.  Allergies:  Allergies  Allergen Reactions   Amoxicillin Hives, Itching and Rash   Codeine Hives, Itching and Rash    Hydrocodone Rash   Keflex [Cephalexin] Rash   Penicillins Rash   Sulfa Antibiotics Rash    Medications Prior to Admission  Medication Sig Dispense Refill   alendronate (FOSAMAX) 70 MG tablet Take 1 tablet (70 mg total) by mouth once a week. Take with a full glass of water on an empty stomach. (Patient taking differently: Take 70 mg by mouth every Sunday. Take with a full glass of water on an empty stomach.) 4 tablet 0   ALPRAZolam (XANAX) 0.5 MG tablet TAKE 1 TABLET BY MOUTH AT BEDTIME. 30 tablet 3   amlodipine-olmesartan (AZOR) 10-20 MG tablet Take 1 tablet by mouth daily. 90 tablet 0   azelastine (ASTELIN) 0.1 % nasal spray Place 1 spray into both nostrils 2 (two) times daily. Use in each nostril as directed (Patient taking differently: Place 2 sprays into both nostrils in the morning. Use in each nostril as directed) 30 mL 1   Calcium Carb-Cholecalciferol (CALCIUM 600/VITAMIN D3 PO) Take 1 tablet by mouth in the morning.     cholecalciferol (VITAMIN D3) 25 MCG (1000 UNIT) tablet Take 1,000 Units by mouth in the morning.     cyclobenzaprine (  FLEXERIL) 5 MG tablet Take 1 tablet (5 mg total) by mouth 2 (two) times daily as needed for muscle spasms. 30 tablet 1   desloratadine (CLARINEX) 5 MG tablet Take 1 tablet (5 mg total) by mouth daily. 30 tablet 0   flunisolide (NASALIDE) 25 MCG/ACT (0.025%) SOLN Place 2 sprays into the nose 2 (two) times daily. (Patient taking differently: Place 2 sprays into the nose in the morning.) 25 mL 0   Naphazoline-Pheniramine (OPCON-A) 0.027-0.315 % SOLN Place 1 drop into both eyes 3 (three) times daily as needed (allergy eyes.).     Omega-3 Fatty Acids (FISH OIL) 1000 MG CAPS Take 1,000 mg by mouth in the morning.     RABEprazole (ACIPHEX) 20 MG tablet Take 1 tablet (20 mg total) by mouth daily. 30 tablet 0   RAYALDEE 30 MCG CPCR Take 30 mcg by mouth 2 (two) times a week. Take 1 capsule (30 mcg) by mouth on Mondays & Fridays in the evening.     rosuvastatin  (CRESTOR) 20 MG tablet take 1 tablet once daily. (Patient taking differently: Take 20 mg by mouth at bedtime.) 30 tablet 0   sertraline (ZOLOFT) 50 MG tablet TAKE (1) TABLET EVERY NIGHT AT BEDTIME. 30 tablet 0   albuterol (VENTOLIN HFA) 108 (90 Base) MCG/ACT inhaler Inhale 2 puffs into the lungs as needed. (Patient taking differently: Inhale 2 puffs into the lungs every 6 (six) hours as needed for wheezing or shortness of breath.) 6.7 g 3   predniSONE (STERAPRED UNI-PAK 21 TAB) 10 MG (21) TBPK tablet Take as package instructions. (Patient not taking: Reported on 08/06/2022) 1 each 0    Results for orders placed or performed during the hospital encounter of 08/10/22 (from the past 48 hour(s))  CBC     Status: Abnormal   Collection Time: 08/10/22 12:35 PM  Result Value Ref Range   WBC 6.8 4.0 - 10.5 K/uL   RBC 4.04 3.87 - 5.11 MIL/uL   Hemoglobin 10.7 (L) 12.0 - 15.0 g/dL   HCT 16.1 (L) 09.6 - 04.5 %   MCV 84.9 80.0 - 100.0 fL   MCH 26.5 26.0 - 34.0 pg   MCHC 31.2 30.0 - 36.0 g/dL   RDW 40.9 81.1 - 91.4 %   Platelets 185 150 - 400 K/uL   nRBC 0.0 0.0 - 0.2 %    Comment: Performed at [facility], [address] [place], [place] [address]  Basic metabolic panel     Status: Abnormal   Collection Time: 08/10/22 12:35 PM  Result Value Ref Range   Sodium 137 135 - 145 mmol/L   Potassium 4.9 3.5 - 5.1 mmol/L   Chloride 105 98 - 111 mmol/L   CO2 24 22 - 32 mmol/L   Glucose, Bld 105 (H) 70 - 99 mg/dL    Comment: Glucose reference range applies only to samples taken after fasting for at least 8 hours.   BUN 23 8 - 23 mg/dL   Creatinine, Ser 6.21 (H) 0.44 - 1.00 mg/dL   Calcium 9.3 8.9 - 30.8 mg/dL   GFR, Estimated 34 (L) >60 mL/min    Comment: (NOTE) Calculated using the CKD-EPI Creatinine Equation (2021)    Anion gap 8 5 - 15    Comment: Performed at [facility], [address] [place], [place] [address]   No results found.  Review of Systems  Musculoskeletal:  Positive  for arthralgias.  All other systems reviewed and are negative.  Blood pressure 138/79, pulse 75, temperature 98.9 F (37.2 C), temperature source Oral, resp. rate 16, height 5\' 1"  (1.549 m), weight 68 kg, SpO2 98 %. Physical Exam Vitals reviewed.  HENT:     Head: Normocephalic.     Nose: Nose normal.     Mouth/Throat:     Mouth: Mucous membranes are moist.  Eyes:     Pupils: Pupils are equal, round, and reactive to light.  Cardiovascular:     Rate and Rhythm: Normal rate.     Pulses: Normal pulses.  Pulmonary:     Effort: Pulmonary effort is normal.  Abdominal:     General: Abdomen is flat.  Musculoskeletal:     Cervical back: Normal range of motion.  Skin:    General: Skin is warm.     Capillary Refill: Capillary refill takes less than 2 seconds.  Neurological:     General: No focal deficit present.     Mental Status: She is alert.     Ortho exam unchanged from prior visit.  She does have a little bit of pain with rotation of the leg but no pain with axial loading.  Hip flexion strength is 5+ out of 5 bilaterally.  Gait is normal today.  She is able to fully weight-bear on that left leg.    Assessment/Plan  Impression stress reaction left femur associated with Fosamax use.  Plan is intramedullary nail with proximal and distal interlocks.  Risk and benefits are discussed.  Anticipate this could be an outpatient procedure.  Would like to get this fixed before stress reaction progresses to stress fracture.  Anticipate out of work time about 6 weeks.  All questions answered   Burnard Bunting, MD 08/10/2022, 3:04 PM

## 2022-08-10 NOTE — Op Note (Signed)
Carol Maldonado, Carol Maldonado MEDICAL RECORD NO: [id number] ACCOUNT NO: [id number] DATE OF BIRTH: Jan 05, 1961 FACILITY: [facility] LOCATION: [facility] PHYSICIAN: Graylin Shiver. August Saucer, MD  Operative Report   DATE OF PROCEDURE: 08/10/2022  PREOPERATIVE DIAGNOSIS:  Left hip subtrochanteric femur stress reaction/fracture.  POSTOPERATIVE DIAGNOSIS:  Left hip subtrochanteric femur stress reaction/fracture.  PROCEDURE:  IM nail, left femur utilizing Smith and Nephew Trigen Meta-Tan nail 10 x 34 cm with 1 proximal interlocking screw and 1 distal interlocking screw.  SURGEON ATTENDING:  Graylin Shiver. August Saucer, MD  ASSISTANT:  Karenann Cai, PA.  INDICATIONS:  This is a 62 year old patient who has been on Fosamax who has left hip stress reaction in the classic location for Fosamax induced stress fractures in the subtrochanteric region.  She presents now for prophylactic IM nailing.  DESCRIPTION OF PROCEDURE:  The patient was brought to the operating room where general anesthetic was induced.  Preoperative antibiotics administered.  Timeout was called.  The patient was placed on the fracture bed with the right leg well padded in  lithotomy position with the peroneal nerve padded.  Left leg placed under baseline traction.  Left hip and leg region was pre-scrubbed with alcohol and Betadine, allowed to air dry.  Prepped with DuraPrep solution and draped in sterile manner.  Ioban  used to cover the operative field.  Timeout was called.  An incision about 4 fingerbreadths proximal to the tip of the trochanter was made.  Skin and subcutaneous tissue were sharply divided.  Guide pin placed through the tip of the trochanter into the  femur under fluoroscopic guidance centered in the AP and lateral planes.  Proximal reaming then performed.  A guide pin placed down to the superior pole of the patella.  Reaming was then performed up to 11.5 mm.  A 10 x 34 nail was placed and 1 proximal  interlocking screw was placed through a separate  small incision and one distal interlocking screw was placed through a separate incision.  All in all, the nail position was good and the proximal and distal interlocking screws, obtained good purchase.   Thorough irrigation of all incisions was performed interlocking incisions closed using 2-0 Vicryl, 3-0 nylon.  The larger more proximal incision was closed using 0 Vicryl, 2-0 Vicryl and 3-0 nylon.  All incisions were anesthetized using Marcaine,  morphine, clonidine.  Impervious dressings applied.  The patient will be weightbearing as tolerated with a walker/crutches.  She tolerated the procedure well without immediate complications.  Luke's assistance was required at all times for retraction,  opening, closing, mobilization of tissue.  His assistance was a medical necessity.   PUS D: 08/10/2022 6:22:53 pm T: 08/10/2022 8:04:00 pm  JOB: [id number]/ [id number]

## 2022-08-10 NOTE — Anesthesia Procedure Notes (Signed)
Procedure Name: Intubation Date/Time: 08/10/2022 4:20 PM  Performed by: Kayleen Memos, CRNAPre-anesthesia Checklist: Patient identified, Emergency Drugs available, Suction available, Patient being monitored and Timeout performed Patient Re-evaluated:Patient Re-evaluated prior to induction Oxygen Delivery Method: Circle system utilized Preoxygenation: Pre-oxygenation with 100% oxygen Induction Type: IV induction Ventilation: Mask ventilation without difficulty Laryngoscope Size: Mac and 3 Grade View: Grade I Tube type: Oral Tube size: 7.0 mm Number of attempts: 1 Airway Equipment and Method: Stylet Placement Confirmation: ETT inserted through vocal cords under direct vision Secured at: 22 cm Tube secured with: Tape Dental Injury: Teeth and Oropharynx as per pre-operative assessment

## 2022-08-10 NOTE — Transfer of Care (Signed)
Immediate Anesthesia Transfer of Care Note  Patient: Carol Maldonado  Procedure(s) Performed: LEFT FEMUR INTRAMEDULLARY (IM) NAIL (Left: Leg Upper)  Patient Location: PACU  Anesthesia Type:General  Level of Consciousness: awake and drowsy  Airway & Oxygen Therapy: Patient Spontanous Breathing and Patient connected to face mask oxygen  Post-op Assessment: Report given to RN, Post -op Vital signs reviewed and stable, and Patient moving all extremities  Post vital signs: stable  Last Vitals:  Vitals Value Taken Time  BP    Temp    Pulse 82 08/10/22 1817  Resp 15 08/10/22 1817  SpO2 100 % 08/10/22 1817  Vitals shown include unvalidated device data.  Last Pain:  Vitals:   08/10/22 1331  TempSrc:   PainSc: 0-No pain         Complications: No notable events documented.

## 2022-08-10 NOTE — Brief Op Note (Signed)
   08/10/2022  6:19 PM  PATIENT:  Carol Maldonado  62 y.o. female  PRE-OPERATIVE DIAGNOSIS:  left subtrochanteric femur fracture  POST-OPERATIVE DIAGNOSIS:  left subtrochanteric femur fracture  PROCEDURE:  Procedure(s): LEFT FEMUR INTRAMEDULLARY (IM) NAIL  SURGEON:  Surgeon(s): August Saucer, Corrie Mckusick, MD  ASSISTANT: magnant pa  ANESTHESIA:   general  EBL: 50 ml    Total I/O In: -  Out: 100 [Blood:100]  BLOOD ADMINISTERED: none  DRAINS: none   LOCAL MEDICATIONS USED:  marcaine mso4 clonidine  SPECIMEN:  No Specimen  COUNTS:  YES  TOURNIQUET:  * No tourniquets in log *  DICTATION: .Other Dictation: Dictation Number [id number]  PLAN OF CARE: Discharge to home after PACU  PATIENT DISPOSITION:  PACU - hemodynamically stable

## 2022-08-10 NOTE — Anesthesia Preprocedure Evaluation (Addendum)
Anesthesia Evaluation  Patient identified by MRN, date of birth, ID band Patient awake    Reviewed: Allergy & Precautions, NPO status , Patient's Chart, lab work & pertinent test results  History of Anesthesia Complications Negative for: history of anesthetic complications  Airway Mallampati: II  TM Distance: >3 FB Neck ROM: Full    Dental  (+) Missing,    Pulmonary neg pulmonary ROS   Pulmonary exam normal        Cardiovascular hypertension, Pt. on medications Normal cardiovascular exam     Neuro/Psych   Anxiety Depression    left subtrochanteric femur fracture    GI/Hepatic Neg liver ROS,GERD  ,,  Endo/Other  negative endocrine ROS    Renal/GU Renal InsufficiencyRenal disease     Musculoskeletal negative musculoskeletal ROS (+)    Abdominal   Peds  Hematology negative hematology ROS (+)   Anesthesia Other Findings Day of surgery medications reviewed with patient.  Reproductive/Obstetrics                             Anesthesia Physical Anesthesia Plan  ASA: 2  Anesthesia Plan: General   Post-op Pain Management: Tylenol PO (pre-op)*   Induction: Intravenous  PONV Risk Score and Plan: 3 and Treatment may vary due to age or medical condition, Ondansetron, Dexamethasone and Midazolam  Airway Management Planned: Oral ETT  Additional Equipment: None  Intra-op Plan:   Post-operative Plan: Extubation in OR  Informed Consent: I have reviewed the patients History and Physical, chart, labs and discussed the procedure including the risks, benefits and alternatives for the proposed anesthesia with the patient or authorized representative who has indicated his/her understanding and acceptance.     Dental advisory given  Plan Discussed with: CRNA  Anesthesia Plan Comments:        Anesthesia Quick Evaluation

## 2022-08-11 ENCOUNTER — Other Ambulatory Visit: Payer: Self-pay

## 2022-08-11 ENCOUNTER — Encounter (HOSPITAL_COMMUNITY): Payer: Self-pay | Admitting: Orthopedic Surgery

## 2022-08-11 NOTE — Anesthesia Postprocedure Evaluation (Signed)
Anesthesia Post Note  Patient: Carol Maldonado  Procedure(s) Performed: LEFT FEMUR INTRAMEDULLARY (IM) NAIL (Left: Leg Upper)     Patient location during evaluation: PACU Anesthesia Type: General Level of consciousness: awake and alert Pain management: pain level controlled Vital Signs Assessment: post-procedure vital signs reviewed and stable Respiratory status: spontaneous breathing, nonlabored ventilation, respiratory function stable and patient connected to nasal cannula oxygen Cardiovascular status: blood pressure returned to baseline and stable Postop Assessment: no apparent nausea or vomiting Anesthetic complications: no   No notable events documented.  Last Vitals:  Vitals:   08/10/22 1900 08/10/22 1915  BP: 112/63 114/68  Pulse: 72 67  Resp: 12 10  Temp:  (!) 36.1 C  SpO2: 95% 96%    Last Pain:  Vitals:   08/10/22 1915  TempSrc:   PainSc: 0-No pain                 Collene Schlichter

## 2022-08-20 ENCOUNTER — Ambulatory Visit (INDEPENDENT_AMBULATORY_CARE_PROVIDER_SITE_OTHER): Payer: 59 | Admitting: Surgical

## 2022-08-20 ENCOUNTER — Other Ambulatory Visit (INDEPENDENT_AMBULATORY_CARE_PROVIDER_SITE_OTHER): Payer: 59

## 2022-08-20 DIAGNOSIS — M84352S Stress fracture, left femur, sequela: Secondary | ICD-10-CM

## 2022-08-20 MED ORDER — HYDROMORPHONE HCL 2 MG PO TABS: 1.0000 mg | ORAL_TABLET | Freq: Three times a day (TID) | ORAL | 0 refills | Status: AC | PRN

## 2022-08-22 ENCOUNTER — Encounter: Payer: Self-pay | Admitting: Surgical

## 2022-08-22 NOTE — Progress Notes (Signed)
Post-Op Visit Note   Patient: Carol Maldonado           Date of Birth: October 09, 1960           MRN: [id number] Visit Date: 08/20/2022 PCP: Anabel Halon, MD   Assessment & Plan:  Chief Complaint:  Chief Complaint  Patient presents with   Left Leg - Routine Post Op   Visit Diagnoses:  1. Stress fracture of femoral shaft, left, sequela     Plan: Patient is a 62 year old female who presents s/p left femur intramedullary nail on 08/10/2022.  States that she is having a fair amount of pain and just taking ibuprofen for pain control as she cannot take the oxycodone since she has an allergy to this as well as hydrocodone.  She is waking with pain 3 times in the last week due to the pain she is having.  No fevers or chills.  No chest pain, shortness of breath, calf pain.  Taking aspirin twice daily for DVT prophylaxis.  On exam, she has intact ankle dorsiflexion and plantarflexion.  No knee effusion noted.  He has no pain with hip range of motion.  She is able to perform straight leg raise without any extensor lag.  Radiographs demonstrate intramedullary nail in good alignment without any complicating features.  Will plan on having her continue with weightbearing as tolerated in the crutches as she has been doing and work on weaning off the crutches as she is able.  She may go from 2 crutches to 1 crutch when she feels comfortable and from 1 crutch to no crutches.  We will see her back in 3 weeks and recheck how she is mobilizing and how her pain is doing.  May need physical therapy but for now, she would like to continue just to try to mobilize and see how she is able to do.  Call with any concerns.  Dilaudid prescribed for pain control.  Follow-Up Instructions: No follow-ups on file.   Orders:  Orders Placed This Encounter  Procedures   XR FEMUR MIN 2 VIEWS LEFT   Meds ordered this encounter  Medications   HYDROmorphone (DILAUDID) 2 MG tablet    Sig: Take 0.5 tablets (1 mg total) by mouth  every 8 (eight) hours as needed for severe pain.    Dispense:  15 tablet    Refill:  0    Imaging: No results found.  PMFS History: Patient Active Problem List   Diagnosis Date Noted   Thigh pain, musculoskeletal, left 06/10/2022   GAD (generalized anxiety disorder) 06/10/2022   Hyperkalemia 11/20/2021   Encounter for general adult medical examination with abnormal findings 11/09/2021   Osteoporosis 03/19/2021   GERD (gastroesophageal reflux disease) 03/19/2021   HLD (hyperlipidemia) 03/19/2021   MDD (major depressive disorder), recurrent, in partial remission (HCC) 03/19/2021   Anemia 11/12/2020   Stage 3b chronic kidney disease (HCC) 08/05/2020   Colon cancer screening 06/23/2020   Insomnia, unspecified 03/03/2020   Essential hypertension, benign 03/03/2020   Seasonal allergic rhinitis 03/03/2020   Past Medical History:  Diagnosis Date   Allergy    Phreesia 02/29/2020   Anxiety    Phreesia 02/29/2020   Hyperlipidemia    Phreesia 02/29/2020   Hypertension    Phreesia 02/29/2020   Insomnia    Osteoporosis    Phreesia 02/29/2020   Seasonal allergies     Family History  Problem Relation Age of Onset   Hypertension Mother    Heart attack Mother  Pneumonia Maternal Grandmother    Other Maternal Grandfather        MVA   Hypertension Father    High Cholesterol Father    Irritable bowel syndrome Sister    Irritable bowel syndrome Sister    Colon cancer Neg Hx     Past Surgical History:  Procedure Laterality Date   COLONOSCOPY  03/27/2011   Dr. Jena Gauss; entirely normal exam and recommended repeat in 10 years.   COLONOSCOPY WITH PROPOFOL N/A 08/28/2020   Procedure: COLONOSCOPY WITH PROPOFOL;  Surgeon: Corbin Ade, MD;  Location: [facility];  Service: Endoscopy;  Laterality: N/A;  8:30am   FEMUR IM NAIL Left 08/10/2022   Procedure: LEFT FEMUR INTRAMEDULLARY (IM) NAIL;  Surgeon: Cammy Copa, MD;  Location: [facility];  Service: Orthopedics;  Laterality: Left;    Social History   Occupational History   Not on file  Tobacco Use   Smoking status: Never   Smokeless tobacco: Never  Vaping Use   Vaping Use: Never used  Substance and Sexual Activity   Alcohol use: Never    Alcohol/week: 0.0 standard drinks of alcohol   Drug use: Never   Sexual activity: Not Currently    Birth control/protection: Post-menopausal

## 2022-08-25 ENCOUNTER — Telehealth: Payer: Self-pay | Admitting: Orthopedic Surgery

## 2022-08-25 NOTE — Telephone Encounter (Signed)
Pt called requesting a handicap placard. Please call pt when ready for pick up. Pt phone number is [phone] or [phone].

## 2022-08-26 ENCOUNTER — Telehealth: Payer: Self-pay | Admitting: Orthopedic Surgery

## 2022-08-26 NOTE — Telephone Encounter (Signed)
Patient came in to drop off [place] paperwork, auth form and $25 check for [employer]

## 2022-08-26 NOTE — Telephone Encounter (Signed)
Okay for this, 4 months

## 2022-08-26 NOTE — Telephone Encounter (Signed)
Tried calling to advise could pick up at front desk. No answer. LMVM

## 2022-08-27 ENCOUNTER — Telehealth: Payer: Self-pay | Admitting: Orthopedic Surgery

## 2022-08-27 ENCOUNTER — Other Ambulatory Visit: Payer: Self-pay | Admitting: Internal Medicine

## 2022-08-27 DIAGNOSIS — E782 Mixed hyperlipidemia: Secondary | ICD-10-CM

## 2022-08-27 DIAGNOSIS — G4700 Insomnia, unspecified: Secondary | ICD-10-CM

## 2022-08-27 DIAGNOSIS — J302 Other seasonal allergic rhinitis: Secondary | ICD-10-CM

## 2022-08-27 DIAGNOSIS — K219 Gastro-esophageal reflux disease without esophagitis: Secondary | ICD-10-CM

## 2022-08-27 NOTE — Telephone Encounter (Signed)
[place] forms received. To [employer]. 

## 2022-08-29 ENCOUNTER — Other Ambulatory Visit: Payer: Self-pay | Admitting: Internal Medicine

## 2022-08-29 DIAGNOSIS — J302 Other seasonal allergic rhinitis: Secondary | ICD-10-CM

## 2022-08-29 DIAGNOSIS — K219 Gastro-esophageal reflux disease without esophagitis: Secondary | ICD-10-CM

## 2022-08-29 DIAGNOSIS — G4700 Insomnia, unspecified: Secondary | ICD-10-CM

## 2022-08-29 DIAGNOSIS — E782 Mixed hyperlipidemia: Secondary | ICD-10-CM

## 2022-08-29 DIAGNOSIS — S7226XG Nondisplaced subtrochanteric fracture of unspecified femur, subsequent encounter for closed fracture with delayed healing: Secondary | ICD-10-CM

## 2022-08-30 MED ORDER — SERTRALINE HCL 50 MG PO TABS
50.0000 mg | ORAL_TABLET | Freq: Every day | ORAL | 0 refills | Status: DC
Start: 2022-08-30 — End: 2022-09-21

## 2022-08-30 MED ORDER — AMLODIPINE-OLMESARTAN 10-20 MG PO TABS: 1.0000 | ORAL_TABLET | Freq: Every day | ORAL | 0 refills | Status: DC

## 2022-08-30 MED ORDER — DESLORATADINE 5 MG PO TABS
5.0000 mg | ORAL_TABLET | Freq: Every day | ORAL | 0 refills | Status: DC
Start: 2022-08-30 — End: 2022-09-21

## 2022-08-30 MED ORDER — ROSUVASTATIN CALCIUM 20 MG PO TABS
20.0000 mg | ORAL_TABLET | Freq: Every day | ORAL | 0 refills | Status: DC
Start: 2022-08-30 — End: 2022-09-21

## 2022-08-30 MED ORDER — ALENDRONATE SODIUM 70 MG PO TABS: 70.0000 mg | ORAL_TABLET | ORAL | 0 refills | Status: DC

## 2022-08-30 MED ORDER — FLUNISOLIDE 25 MCG/ACT (0.025%) NA SOLN: 2.0000 | Freq: Two times a day (BID) | NASAL | 0 refills | Status: DC

## 2022-08-30 MED ORDER — RABEPRAZOLE SODIUM 20 MG PO TBEC
20.0000 mg | DELAYED_RELEASE_TABLET | Freq: Every day | ORAL | 0 refills | Status: DC
Start: 2022-08-30 — End: 2022-09-21

## 2022-08-30 MED ORDER — RAYALDEE 30 MCG PO CPCR: 30.0000 ug | ORAL_CAPSULE | ORAL | 0 refills | Status: DC

## 2022-09-08 ENCOUNTER — Ambulatory Visit (INDEPENDENT_AMBULATORY_CARE_PROVIDER_SITE_OTHER): Payer: 59 | Admitting: Orthopedic Surgery

## 2022-09-08 DIAGNOSIS — M79652 Pain in left thigh: Secondary | ICD-10-CM

## 2022-09-08 DIAGNOSIS — M84352S Stress fracture, left femur, sequela: Secondary | ICD-10-CM

## 2022-09-09 ENCOUNTER — Telehealth: Payer: Self-pay | Admitting: Orthopedic Surgery

## 2022-09-09 DIAGNOSIS — M84352S Stress fracture, left femur, sequela: Secondary | ICD-10-CM

## 2022-09-09 NOTE — Telephone Encounter (Signed)
Pt called requesting to go to a different office in [place] for PT. Pt states the facility is booked out. Please call pt about this matter at [phone] or [phone]

## 2022-09-11 ENCOUNTER — Encounter: Payer: Self-pay | Admitting: Orthopedic Surgery

## 2022-09-11 NOTE — Progress Notes (Signed)
Post-Op Visit Note   Patient: Carol Maldonado           Date of Birth: 10-03-1960           MRN: [id number] Visit Date: 09/08/2022 PCP: Carol Halon, MD   Assessment & Plan:  Chief Complaint:  Chief Complaint  Patient presents with   Left Leg - Routine Post Op    left femur intramedullary nail on 08/10/2022.     Visit Diagnoses:  1. Stress fracture of femoral shaft, left, sequela   2. Left thigh pain     Plan: Patient is a 62 year old patient who underwent IM nail 08/10/2022 for stress fracture.  Dilaudid makes her feel bad.  Oxycodone and Norco because of oral blisters.  Patient still has some pain which wakes her from sleep.  He is ambulating full weightbearing with crutches but is unable to lay on that left-hand side.  Plan at this time is physical therapy in [place] for gait training and strengthening status post left femur stress fracture.  No restrictions on range of motion or strengthening.  Weightbearing as tolerated.  3 times a week for 4 weeks with 4-week return after that.  Follow-Up Instructions: No follow-ups on file.   Orders:  Orders Placed This Encounter  Procedures   Ambulatory referral to Physical Therapy   No orders of the defined types were placed in this encounter.   Imaging: No results found.  PMFS History: Patient Active Problem List   Diagnosis Date Noted   Closed nondisplaced subtrochanteric fracture of femur with delayed healing 08/29/2022   Thigh pain, musculoskeletal, left 06/10/2022   GAD (generalized anxiety disorder) 06/10/2022   Hyperkalemia 11/20/2021   Encounter for general adult medical examination with abnormal findings 11/09/2021   Osteoporosis 03/19/2021   GERD (gastroesophageal reflux disease) 03/19/2021   HLD (hyperlipidemia) 03/19/2021   MDD (major depressive disorder), recurrent, in partial remission (HCC) 03/19/2021   Anemia 11/12/2020   Stage 3b chronic kidney disease (HCC) 08/05/2020   Colon cancer screening  06/23/2020   Insomnia, unspecified 03/03/2020   Essential hypertension, benign 03/03/2020   Seasonal allergic rhinitis 03/03/2020   Past Medical History:  Diagnosis Date   Allergy    Phreesia 02/29/2020   Anxiety    Phreesia 02/29/2020   Hyperlipidemia    Phreesia 02/29/2020   Hypertension    Phreesia 02/29/2020   Insomnia    Osteoporosis    Phreesia 02/29/2020   Seasonal allergies     Family History  Problem Relation Age of Onset   Hypertension Mother    Heart attack Mother    Pneumonia Maternal Grandmother    Other Maternal Grandfather        MVA   Hypertension Father    High Cholesterol Father    Irritable bowel syndrome Sister    Irritable bowel syndrome Sister    Colon cancer Neg Hx     Past Surgical History:  Procedure Laterality Date   COLONOSCOPY  03/27/2011   Dr. Jena Gauss; entirely normal exam and recommended repeat in 10 years.   COLONOSCOPY WITH PROPOFOL N/A 08/28/2020   Procedure: COLONOSCOPY WITH PROPOFOL;  Surgeon: Corbin Ade, MD;  [facility];  Service: Endoscopy;  Laterality: N/A;  8:30am   FEMUR IM NAIL Left 08/10/2022   Procedure: LEFT FEMUR INTRAMEDULLARY (IM) NAIL;  Surgeon: Cammy Copa, MD;  Location: [facility];  Service: Orthopedics;  Laterality: Left;   Social History   Occupational History   Not on file  Tobacco Use   Smoking status: Never   Smokeless tobacco: Never  Vaping Use   Vaping Use: Never used  Substance and Sexual Activity   Alcohol use: Never    Alcohol/week: 0.0 standard drinks of alcohol   Drug use: Never   Sexual activity: Not Currently    Birth control/protection: Post-menopausal

## 2022-09-20 ENCOUNTER — Ambulatory Visit: Payer: 59 | Admitting: Physical Therapy

## 2022-09-20 ENCOUNTER — Other Ambulatory Visit: Payer: Self-pay

## 2022-09-20 ENCOUNTER — Encounter: Payer: Self-pay | Admitting: Physical Therapy

## 2022-09-20 DIAGNOSIS — M6281 Muscle weakness (generalized): Secondary | ICD-10-CM | POA: Diagnosis not present

## 2022-09-20 DIAGNOSIS — M79605 Pain in left leg: Secondary | ICD-10-CM | POA: Diagnosis not present

## 2022-09-20 DIAGNOSIS — R262 Difficulty in walking, not elsewhere classified: Secondary | ICD-10-CM

## 2022-09-20 NOTE — Therapy (Signed)
OUTPATIENT PHYSICAL THERAPY LOWER EXTREMITY EVALUATION   Patient Name: Carol Maldonado MRN: [id number] DOB:03/07/1961, 62 y.o., female Today's Date: 09/20/2022  END OF SESSION:  PT End of Session - 09/20/22 1140     Visit Number 1    Number of Visits 12    Date for PT Re-Evaluation 11/01/22    Authorization Type UHC    PT Start Time 1100    PT Stop Time 1135    PT Time Calculation (min) 35 min    Activity Tolerance Patient tolerated treatment well    Behavior During Therapy [facility] for tasks assessed/performed             Past Medical History:  Diagnosis Date   Allergy    Phreesia 02/29/2020   Anxiety    Phreesia 02/29/2020   Hyperlipidemia    Phreesia 02/29/2020   Hypertension    Phreesia 02/29/2020   Insomnia    Osteoporosis    Phreesia 02/29/2020   Seasonal allergies    Past Surgical History:  Procedure Laterality Date   COLONOSCOPY  03/27/2011   Dr. Jena Gauss; entirely normal exam and recommended repeat in 10 years.   COLONOSCOPY WITH PROPOFOL N/A 08/28/2020   Procedure: COLONOSCOPY WITH PROPOFOL;  Surgeon: Corbin Ade, MD;  [facility];  Service: Endoscopy;  Laterality: N/A;  8:30am   FEMUR IM NAIL Left 08/10/2022   Procedure: LEFT FEMUR INTRAMEDULLARY (IM) NAIL;  Surgeon: Cammy Copa, MD;  Location: [facility];  Service: Orthopedics;  Laterality: Left;   Patient Active Problem List   Diagnosis Date Noted   Closed nondisplaced subtrochanteric fracture of femur with delayed healing 08/29/2022   Thigh pain, musculoskeletal, left 06/10/2022   GAD (generalized anxiety disorder) 06/10/2022   Hyperkalemia 11/20/2021   Encounter for general adult medical examination with abnormal findings 11/09/2021   Osteoporosis 03/19/2021   GERD (gastroesophageal reflux disease) 03/19/2021   HLD (hyperlipidemia) 03/19/2021   MDD (major depressive disorder), recurrent, in partial remission (HCC) 03/19/2021   Anemia 11/12/2020   Stage 3b chronic kidney disease  (HCC) 08/05/2020   Colon cancer screening 06/23/2020   Insomnia, unspecified 03/03/2020   Essential hypertension, benign 03/03/2020   Seasonal allergic rhinitis 03/03/2020    PCP: Anabel Halon, MD   REFERRING PROVIDER: Cammy Copa, MD   REFERRING DIAG: [phone] (ICD-10-CM) - Stress fracture of femoral shaft, left, sequela   THERAPY DIAG:  Pain in left leg  Muscle weakness (generalized)  Difficulty in walking, not elsewhere classified  Rationale for Evaluation and Treatment: Rehabilitation  ONSET DATE: left femur intramedullary nail on 08/10/2022   SUBJECTIVE:   SUBJECTIVE STATEMENT: She has been on osteoporosis meds and reports this led to stress fracture. She had surgery and this is the first surgery she has ever had. She still has some difficulty putting her full weight through her left leg. She says she can't take a lot of pain meds because she is allergic or they knock her out.   PERTINENT HISTORY: s/p left femur fracture, no restrictions, WBAT, osteoporosis PAIN:  Are you having pain? Yes: NPRS scale: 4-5/10 Pain location: Rt thigh Pain description: ache, throb, pull Aggravating factors: getting in/out of car, putting weight on her left leg Relieving factors: over the counter NSAIDs, sitting with leg reclined  PRECAUTIONS: None  WEIGHT BEARING RESTRICTIONS: No  FALLS:  Has patient fallen in last 6 months? 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  Intramedullary nail in good  alignment without any complicating features.  No change in position  compared with intraoperative radiographs.   PATIENT SURVEYS:  Eval: FOTO functional intake 3%, goal is 47%  COGNITION: Overall cognitive status: Within functional limits for tasks  assessed     SENSATION: WFL  MUSCLE LENGTH:  Eval: Thomas test: Left very tight   PALPATION: Eval: TTP in left quads  LOWER EXTREMITY ROM:  Active ROM Right eval Left eval  Hip flexion    Hip extension    Hip abduction    Hip adduction    Hip internal rotation    Hip external rotation    Knee flexion 135 125  Knee extension  0  Ankle dorsiflexion    Ankle plantarflexion    Ankle inversion    Ankle eversion     (Blank rows = not tested)  LOWER EXTREMITY MMT:  MMT Right eval Left eval  Hip flexion  3  Hip extension    Hip abduction  3+  Hip adduction    Hip internal rotation    Hip external rotation    Knee flexion 5 3+  Knee extension 5 3+  Ankle dorsiflexion    Ankle plantarflexion    Ankle inversion    Ankle eversion     (Blank rows = not tested)  LOWER EXTREMITY SPECIAL TESTS:    FUNCTIONAL TESTS:  Eval: Unable to sit to stand without UE support  GAIT:  Eval: Distance walked: 75 feet X 2 with bilateral crutches step to pattern, crutches appear to be too short for her but cannot be lengthened any more.   TODAY'S TREATMENT:  Eval HEP creation and review with demonstration and trial set preformed, see below for details    PATIENT EDUCATION: Education details: HEP, PT plan of care Person educated: Patient Education method: Explanation, Demonstration, Verbal cues, and Handouts Education comprehension: verbalized understanding and needs further education   HOME EXERCISE PROGRAM: Access Code: [email or web address] URL: [email or web address] Date: 09/20/2022 Prepared by: Ivery Quale  Exercises - Supine Heel Slide with Strap  - 2 x daily - 6 x weekly - 1-2 sets - 10 reps - 5 hold - Supine Active Straight Leg Raise (Mirrored)  - 2 x daily - 6 x weekly - 1-3 sets - 10 reps - Seated Knee Extension with Resistance  - 2 x daily - 6 x weekly - 1-3 sets - 10 reps - Hip Abduction with Resistance Loop  - 2 x daily - 6 x weekly - 1-3 sets - 10  reps - Standing Hip Flexion with Resistance Loop  - 2 x daily - 6 x weekly - 1-3 sets - 10 reps - Side to Side Weight Shift with Counter Support  - 2 x daily - 6 x weekly - 1-3 sets - 10 reps  ASSESSMENT:  CLINICAL IMPRESSION: Patient referred to PT S/P left femur intramedullary nail on 08/10/2022. She is WBAT without any restrictions but does have difficulty walking and needs bilateral crutches at this time. MD is recommending 3 times per week for 4 weeks she has 45 mile commute so may only be able to attend 2 times per week and we will have her work hard on HEP the other days. Patient will benefit from skilled PT to address below impairments, limitations and improve overall function.  OBJECTIVE  IMPAIRMENTS: decreased activity tolerance, difficulty walking, decreased balance, decreased endurance, decreased mobility, decreased ROM, decreased strength, impaired flexibility, impaired UE/LE use, postural dysfunction, and pain.  ACTIVITY LIMITATIONS: bending, lifting, carry, locomotion, cleaning, community activity, driving, and or occupation  PERSONAL FACTORS: anx, htn, S/P left femur intramedullary nail on 08/10/2022  are also affecting patient's functional outcome.  REHAB POTENTIAL: Good s CLINICAL DECISION MAKING: Stable/uncomplicated  EVALUATION COMPLEXITY: Low    GOALS: Short term PT Goals Target date: 10/18/2022   Pt will be I and compliant with HEP. Baseline:  Goal status: New Pt will decrease pain by 25% overall Baseline: Goal status: New  Long term PT goals Target date:11/01/2022   Pt will improve left knee AROM >130 deg to improve functional mobility Baseline: Goal status: New Pt will improve  hip/knee strength to at least 4+/5 MMT to improve functional strength Baseline: Goal status: New Pt will improve FOTO to the predicted value to show improved function Baseline: Goal status: New Pt will reduce pain to overall less than 3/10 with usual activity and work  activity. Baseline: Goal status: New Pt will be able to ambulate community distances at least 500 ft WFL gait pattern and LRAD without complaints Baseline: Goal status: New  PLAN: PT FREQUENCY: 2-3 times per week   PT DURATION: 4-6 weeks  PLANNED INTERVENTIONS (unless contraindicated): aquatic PT, Canalith repositioning, cryotherapy, Electrical stimulation, Iontophoresis with 4 mg/ml dexamethasome, Moist heat, traction, Ultrasound, gait training, Therapeutic exercise, balance training, neuromuscular re-education, patient/family education, prosthetic training, manual techniques, passive ROM, dry needling, taping, vasopnuematic device, vestibular, spinal manipulations, joint manipulations  PLAN FOR NEXT SESSION:  Needs left leg strength focus and weight acceptance progressions as pain allows. See if she is interested in water therapy? NEXT MD VISIT: 10/06/22    April Manson, PT,DPT 09/20/2022, 11:41 AM

## 2022-09-21 ENCOUNTER — Other Ambulatory Visit: Payer: Self-pay | Admitting: Internal Medicine

## 2022-09-21 DIAGNOSIS — K219 Gastro-esophageal reflux disease without esophagitis: Secondary | ICD-10-CM

## 2022-09-21 DIAGNOSIS — G4700 Insomnia, unspecified: Secondary | ICD-10-CM

## 2022-09-21 DIAGNOSIS — J302 Other seasonal allergic rhinitis: Secondary | ICD-10-CM

## 2022-09-21 DIAGNOSIS — E782 Mixed hyperlipidemia: Secondary | ICD-10-CM

## 2022-09-22 ENCOUNTER — Ambulatory Visit: Payer: 59 | Admitting: Rehabilitative and Restorative Service Providers"

## 2022-09-22 ENCOUNTER — Encounter: Payer: Self-pay | Admitting: Rehabilitative and Restorative Service Providers"

## 2022-09-22 DIAGNOSIS — M6281 Muscle weakness (generalized): Secondary | ICD-10-CM | POA: Diagnosis not present

## 2022-09-22 DIAGNOSIS — R262 Difficulty in walking, not elsewhere classified: Secondary | ICD-10-CM | POA: Diagnosis not present

## 2022-09-22 DIAGNOSIS — M79605 Pain in left leg: Secondary | ICD-10-CM | POA: Diagnosis not present

## 2022-09-22 NOTE — Therapy (Signed)
OUTPATIENT PHYSICAL THERAPY TREATMENT   Patient Name: Carol Maldonado MRN: [id number] DOB:01-Feb-1961, 62 y.o., female Today's Date: 09/22/2022  END OF SESSION:  PT End of Session - 09/22/22 1334     Visit Number 2    Number of Visits 12    Date for PT Re-Evaluation 11/01/22    Authorization Type UHC    Progress Note Due on Visit 10    PT Start Time 1344    PT Stop Time 1424    PT Time Calculation (min) 40 min    Activity Tolerance Patient tolerated treatment well    Behavior During Therapy [facility] for tasks assessed/performed              Past Medical History:  Diagnosis Date   Allergy    Phreesia 02/29/2020   Anxiety    Phreesia 02/29/2020   Hyperlipidemia    Phreesia 02/29/2020   Hypertension    Phreesia 02/29/2020   Insomnia    Osteoporosis    Phreesia 02/29/2020   Seasonal allergies    Past Surgical History:  Procedure Laterality Date   COLONOSCOPY  03/27/2011   Dr. Jena Gauss; entirely normal exam and recommended repeat in 10 years.   COLONOSCOPY WITH PROPOFOL N/A 08/28/2020   Procedure: COLONOSCOPY WITH PROPOFOL;  Surgeon: Corbin Ade, MD;  [facility];  Service: Endoscopy;  Laterality: N/A;  8:30am   FEMUR IM NAIL Left 08/10/2022   Procedure: LEFT FEMUR INTRAMEDULLARY (IM) NAIL;  Surgeon: Cammy Copa, MD;  Location: [facility];  Service: Orthopedics;  Laterality: Left;   Patient Active Problem List   Diagnosis Date Noted   Closed nondisplaced subtrochanteric fracture of femur with delayed healing 08/29/2022   Thigh pain, musculoskeletal, left 06/10/2022   GAD (generalized anxiety disorder) 06/10/2022   Hyperkalemia 11/20/2021   Encounter for general adult medical examination with abnormal findings 11/09/2021   Osteoporosis 03/19/2021   GERD (gastroesophageal reflux disease) 03/19/2021   HLD (hyperlipidemia) 03/19/2021   MDD (major depressive disorder), recurrent, in partial remission (HCC) 03/19/2021   Anemia 11/12/2020   Stage 3b chronic  kidney disease (HCC) 08/05/2020   Colon cancer screening 06/23/2020   Insomnia, unspecified 03/03/2020   Essential hypertension, benign 03/03/2020   Seasonal allergic rhinitis 03/03/2020    PCP: Anabel Halon, MD   REFERRING PROVIDER: Cammy Copa, MD   REFERRING DIAG: [phone] (ICD-10-CM) - Stress fracture of femoral shaft, left, sequela   THERAPY DIAG:  Pain in left leg  Muscle weakness (generalized)  Difficulty in walking, not elsewhere classified  Rationale for Evaluation and Treatment: Rehabilitation  ONSET DATE: left femur intramedullary nail on 08/10/2022   SUBJECTIVE:   SUBJECTIVE STATEMENT: She indicated resting at home to avoid too much pressure on leg.  Reported no pain upon arrival.  She indicated feeling sore after first visit.    PERTINENT HISTORY: s/p left femur fracture, no restrictions, WBAT, osteoporosis  PAIN:  NPRS scale:  no pian upon arrival.  Pain location: Rt thigh Pain description: ache, throb, pull Aggravating factors: getting in/out of car, putting weight on her left leg Relieving factors: over the counter NSAIDs, sitting with leg reclined  PRECAUTIONS: None  WEIGHT BEARING RESTRICTIONS: No  FALLS:  Has patient fallen in last 6 months? 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  Intramedullary nail in good  alignment without any complicating features.  No change in position  compared with intraoperative radiographs.   PATIENT SURVEYS:  09/20/2022 Eval: FOTO functional intake 3%, goal is 47%  COGNITION: 09/20/2022 Overall cognitive status: Within functional limits for tasks assessed     SENSATION: 09/20/2022 [facility]  MUSCLE LENGTH: 09/20/2022  Eval: Maisie Fus test: Left very  tight   PALPATION: 09/20/2022 Eval: TTP in left quads  LOWER EXTREMITY ROM:  Active ROM Right 09/20/2022 Left 09/20/2022  Hip flexion    Hip extension    Hip abduction    Hip adduction    Hip internal rotation    Hip external rotation    Knee flexion 135 125  Knee extension  0  Ankle dorsiflexion    Ankle plantarflexion    Ankle inversion    Ankle eversion     (Blank rows = not tested)  LOWER EXTREMITY MMT:  MMT Right 09/20/2022 Left 09/20/2022  Hip flexion  3  Hip extension    Hip abduction  3+  Hip adduction    Hip internal rotation    Hip external rotation    Knee flexion 5 3+  Knee extension 5 3+  Ankle dorsiflexion    Ankle plantarflexion    Ankle inversion    Ankle eversion     (Blank rows = not tested)  FUNCTIONAL TESTS:  09/20/2022 Eval: Unable to sit to stand without UE support  GAIT: 09/20/2022   Eval: Distance walked: 75 feet X 2 with bilateral crutches step to pattern, crutches appear to be too short for her but cannot be lengthened any more.   TODAY'S TREATMENT:                                                           DATE:  09/22/2022 Therex: Nustep Lvl 5 UE/LE 8 mins  Leg press double leg 75 lbs x 15 , single leg 2 x 10 37 lbs , performed bilateral Seated quad set 5 sec hold x 10 bilaterally  Sit to stand to sit 18 inch chair s UE assist x 10   Gait Training: SPC training c visual demonstration, verbal cues for techniques and placement of cane in seqeuncing.  Performed in clinic household distances including 100 ft, 25 ft, 50 ft.  Additional time for education on use.   Neuro Re-ed Tandem stance 1 min x 1 bilateral Tandem ambulation fwd/back 10 ft x 5 each way in // bars occasional HHA    TODAY'S TREATMENT:                                                           DATE:  09/20/2022 HEP creation and review with demonstration and trial set preformed, see below for details    PATIENT EDUCATION: 09/20/2022 Education details: HEP,  PT plan of care Person educated: Patient Education method: Explanation, Demonstration, Verbal cues, and Handouts Education comprehension: verbalized understanding and needs further education   HOME EXERCISE PROGRAM: Access Code: [email or web address] URL: [email or web address] Date: 09/20/2022 Prepared by: Ivery Quale  Exercises - Supine  Heel Slide with Strap  - 2 x daily - 6 x weekly - 1-2 sets - 10 reps - 5 hold - Supine Active Straight Leg Raise (Mirrored)  - 2 x daily - 6 x weekly - 1-3 sets - 10 reps - Seated Knee Extension with Resistance  - 2 x daily - 6 x weekly - 1-3 sets - 10 reps - Hip Abduction with Resistance Loop  - 2 x daily - 6 x weekly - 1-3 sets - 10 reps - Standing Hip Flexion with Resistance Loop  - 2 x daily - 6 x weekly - 1-3 sets - 10 reps - Side to Side Weight Shift with Counter Support  - 2 x daily - 6 x weekly - 1-3 sets - 10 reps  ASSESSMENT:  CLINICAL IMPRESSION:  Fair performance with SPC c SBA required c verbal cues.  Will most likely progress quickly in ability with SPC. Continued strengthening warranted at this time with continued skilled PT services.    OBJECTIVE IMPAIRMENTS: decreased activity tolerance, difficulty walking, decreased balance, decreased endurance, decreased mobility, decreased ROM, decreased strength, impaired flexibility, impaired UE/LE use, postural dysfunction, and pain.  ACTIVITY LIMITATIONS: bending, lifting, carry, locomotion, cleaning, community activity, driving, and or occupation  PERSONAL FACTORS: anx, htn, S/P left femur intramedullary nail on 08/10/2022  are also affecting patient's functional outcome.  REHAB POTENTIAL: Good  CLINICAL DECISION MAKING: Stable/uncomplicated  EVALUATION COMPLEXITY: Low    GOALS: Short term PT Goals Target date: 10/18/2022   Pt will be I and compliant with HEP. Baseline:  Goal status: on going 09/22/2022 Pt will decrease pain by 25% overall Baseline: Goal status: on going  09/22/2022  Long term PT goals Target date:11/01/2022   Pt will improve left knee AROM >130 deg to improve functional mobility Baseline: Goal status: New Pt will improve  hip/knee strength to at least 4+/5 MMT to improve functional strength Baseline: Goal status: New Pt will improve FOTO to the predicted value to show improved function Baseline: Goal status: New Pt will reduce pain to overall less than 3/10 with usual activity and work activity. Baseline: Goal status: New Pt will be able to ambulate community distances at least 500 ft WFL gait pattern and LRAD without complaints Baseline: Goal status: New  PLAN: PT FREQUENCY: 2-3 times per week   PT DURATION: 4-6 weeks  PLANNED INTERVENTIONS (unless contraindicated): aquatic PT, Canalith repositioning, cryotherapy, Electrical stimulation, Iontophoresis with 4 mg/ml dexamethasome, Moist heat, traction, Ultrasound, gait training, Therapeutic exercise, balance training, neuromuscular re-education, patient/family education, prosthetic training, manual techniques, passive ROM, dry needling, taping, vasopnuematic device, vestibular, spinal manipulations, joint manipulations  PLAN FOR NEXT SESSION:  Needs left leg strength focus and weight acceptance progressions as pain allows. See if she is interested in water therapy?  NEXT MD VISIT: 10/06/22   Chyrel Masson, PT, DPT, OCS, ATC 09/22/22  2:22 PM

## 2022-09-24 NOTE — Therapy (Signed)
OUTPATIENT PHYSICAL THERAPY TREATMENT   Patient Name: Carol Maldonado MRN: [id number] DOB:December 30, 1960, 62 y.o., female Today's Date: 09/27/2022  END OF SESSION:  PT End of Session - 09/27/22 1512     Visit Number 3    Number of Visits 12    Date for PT Re-Evaluation 11/01/22    Authorization Type [employer]    Progress Note Due on Visit 10    PT Start Time 1514    PT Stop Time 1600    PT Time Calculation (min) 46 min    Activity Tolerance Patient tolerated treatment well;No increased pain    Behavior During Therapy [facility] for tasks assessed/performed               Past Medical History:  Diagnosis Date   Allergy    Phreesia 02/29/2020   Anxiety    Phreesia 02/29/2020   Hyperlipidemia    Phreesia 02/29/2020   Hypertension    Phreesia 02/29/2020   Insomnia    Osteoporosis    Phreesia 02/29/2020   Seasonal allergies    Past Surgical History:  Procedure Laterality Date   COLONOSCOPY  03/27/2011   Dr. Jena Gauss; entirely normal exam and recommended repeat in 10 years.   COLONOSCOPY WITH PROPOFOL N/A 08/28/2020   Procedure: COLONOSCOPY WITH PROPOFOL;  Surgeon: Corbin Ade, MD;  [facility];  Service: Endoscopy;  Laterality: N/A;  8:30am   FEMUR IM NAIL Left 08/10/2022   Procedure: LEFT FEMUR INTRAMEDULLARY (IM) NAIL;  Surgeon: Cammy Copa, MD;  Location: [facility] OR;  Service: Orthopedics;  Laterality: Left;   Patient Active Problem List   Diagnosis Date Noted   Closed nondisplaced subtrochanteric fracture of femur with delayed healing 08/29/2022   Thigh pain, musculoskeletal, left 06/10/2022   GAD (generalized anxiety disorder) 06/10/2022   Hyperkalemia 11/20/2021   Encounter for general adult medical examination with abnormal findings 11/09/2021   Osteoporosis 03/19/2021   GERD (gastroesophageal reflux disease) 03/19/2021   HLD (hyperlipidemia) 03/19/2021   MDD (major depressive disorder), recurrent, in partial remission (HCC) 03/19/2021   Anemia 11/12/2020    Stage 3b chronic kidney disease (HCC) 08/05/2020   Colon cancer screening 06/23/2020   Insomnia, unspecified 03/03/2020   Essential hypertension, benign 03/03/2020   Seasonal allergic rhinitis 03/03/2020    PCP: Anabel Halon, MD   REFERRING PROVIDER: Cammy Copa, MD   REFERRING DIAG: [phone] (ICD-10-CM) - Stress fracture of femoral shaft, left, sequela   THERAPY DIAG:  Pain in left leg  Muscle weakness (generalized)  Difficulty in walking, not elsewhere classified  Rationale for Evaluation and Treatment: Rehabilitation  ONSET DATE: left femur intramedullary nail on 08/10/2022   SUBJECTIVE:   SUBJECTIVE STATEMENT: Pt continues to endorse intermittent soreness/aching, feels no pain at present. Otherwise no new updates  PERTINENT HISTORY: s/p left femur fracture, no restrictions, WBAT, osteoporosis  PAIN:  NPRS scale:  no pain on arrival Pain location: Rt thigh Pain description: ache, throb, pull Aggravating factors: getting in/out of car, putting weight on her left leg Relieving factors: over the counter NSAIDs, sitting with leg reclined  PRECAUTIONS: None  WEIGHT BEARING RESTRICTIONS: No  FALLS:  Has patient fallen in last 6 months? 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  Intramedullary nail in good  alignment without any complicating features.  No change in position  compared with intraoperative radiographs.   PATIENT SURVEYS:  09/20/2022 Eval: FOTO functional intake 3%, goal is 47%  COGNITION: 09/20/2022 Overall cognitive status: Within functional limits for tasks assessed     SENSATION: 09/20/2022 [facility]  MUSCLE  LENGTH: 09/20/2022  Eval: Maisie Fus test: Left very tight   PALPATION: 09/20/2022 Eval: TTP in left quads  LOWER EXTREMITY ROM:  Active ROM Right 09/20/2022 Left 09/20/2022  Hip flexion    Hip extension    Hip abduction    Hip adduction    Hip internal rotation    Hip external rotation    Knee flexion 135 125  Knee extension  0  Ankle dorsiflexion    Ankle plantarflexion    Ankle inversion    Ankle eversion     (Blank rows = not tested)  LOWER EXTREMITY MMT:  MMT Right 09/20/2022 Left 09/20/2022  Hip flexion  3  Hip extension    Hip abduction  3+  Hip adduction    Hip internal rotation    Hip external rotation    Knee flexion 5 3+  Knee extension 5 3+  Ankle dorsiflexion    Ankle plantarflexion    Ankle inversion    Ankle eversion     (Blank rows = not tested)  FUNCTIONAL TESTS:  09/20/2022 Eval: Unable to sit to stand without UE support  GAIT: 09/20/2022   Eval: Distance walked: 75 feet X 2 with bilateral crutches step to pattern, crutches appear to be too short for her but cannot be lengthened any more.   TODAY'S TREATMENT:                                                           [facility] Adult PT Treatment:                                                DATE: 09/27/22 Therapeutic Exercise: Nu step L5 UE/LE 5 min during subjective Seated adductor iso w/ ball 2x10 cues for breath control Seated march LLE only 2x8  STS from slightly raised mat 2x10 no UE support  Attempted SLR, discontinued due to discomfort Quad set on heel prop x15 tactile/verbal cues as needed  for quad LAQ x15 emphasis on eccentric control  Gait training SPC 2x157ft emphasis on sequencing, foot strike, improving trunk mechanics. CGA weaning to SBA SPC 2 laps working on weaving through 6 cones (~21ft each way), CGA for safety, cues for mechanics and pacing SPC 164ft retest with distraction to work on automaticity of gait pattern and reinforce learning, SBA  Education on appropriate  SPC height, reinforced using axillary crutches until mechanics/stability with SPC improve, pacing of activities, gait mechanics/sequencing     DATE:  09/22/2022 Therex: Nustep Lvl 5 UE/LE 8 mins  Leg press double leg 75 lbs x 15 , single leg 2 x 10 37 lbs , performed bilateral Seated quad set 5 sec hold x 10 bilaterally  Sit to stand to sit 18 inch chair s UE assist x 10   Gait Training: SPC training c  visual demonstration, verbal cues for techniques and placement of cane in seqeuncing.  Performed in clinic household distances including 100 ft, 25 ft, 50 ft.  Additional time for education on use.   Neuro Re-ed Tandem stance 1 min x 1 bilateral Tandem ambulation fwd/back 10 ft x 5 each way in // bars occasional HHA    TODAY'S TREATMENT:                                                           DATE:  09/20/2022 HEP creation and review with demonstration and trial set preformed, see below for details    PATIENT EDUCATION: Education details: rationale for interventions, safety w/ AD use Person educated: Patient Education method: Programmer, multimedia, Demonstration, Verbal cues, and Handouts Education comprehension: verbalized understanding and needs further education   HOME EXERCISE PROGRAM: Access Code: [email or web address] URL: [email or web address] Date: 09/20/2022 Prepared by: Ivery Quale  Exercises - Supine Heel Slide with Strap  - 2 x daily - 6 x weekly - 1-2 sets - 10 reps - 5 hold - Supine Active Straight Leg Raise (Mirrored)  - 2 x daily - 6 x weekly - 1-3 sets - 10 reps - Seated Knee Extension with Resistance  - 2 x daily - 6 x weekly - 1-3 sets - 10 reps - Hip Abduction with Resistance Loop  - 2 x daily - 6 x weekly - 1-3 sets - 10 reps - Standing Hip Flexion with Resistance Loop  - 2 x daily - 6 x weekly - 1-3 sets - 10 reps - Side to Side Weight Shift with Counter Support  - 2 x daily - 6 x weekly - 1-3 sets - 10 reps  ASSESSMENT:  CLINICAL IMPRESSION:  Pt arrives  w/o pain, continues to work on open/closed chain stability/strength. Noted difficulty/discomfort with SLR, discontinued today in favor of quad set with heel prop. Also continues to work on [employer], significant time spent today - initial/intermittent difficulty with sequencing particularly with distractions (conversation, obstacles) but improves with repetition/cues. Anticipate may be appropriate to begin integrating SPC into household mobility in next couple visits if continues along current trajectory, although education is provided on continuing to utilize crutches until mechanics/stability improve with SPC in PT sessions - pt verbalizes agreement/understanding. Departs without pain, no adverse events. Recommend continuing along current POC in order to address relevant deficits and improve functional tolerance. Pt departs today's session in no acute distress, all voiced questions/concerns addressed appropriately from PT perspective.     OBJECTIVE IMPAIRMENTS: decreased activity tolerance, difficulty walking, decreased balance, decreased endurance, decreased mobility, decreased ROM, decreased strength, impaired flexibility, impaired UE/LE use, postural dysfunction, and pain.  ACTIVITY LIMITATIONS: bending, lifting, carry, locomotion, cleaning, community activity, driving, and or occupation  PERSONAL FACTORS: anx, htn, S/P left femur intramedullary nail on 08/10/2022  are also affecting patient's functional outcome.  REHAB POTENTIAL: Good  CLINICAL DECISION MAKING: Stable/uncomplicated  EVALUATION COMPLEXITY: Low    GOALS: Short term PT Goals Target date: 10/18/2022   Pt will be I and compliant with HEP. Baseline:  Goal status: on going 09/22/2022 Pt will decrease pain by 25% overall Baseline: Goal status: on going 09/22/2022  Long term PT goals Target date:11/01/2022   Pt will improve left knee AROM >130 deg to improve functional mobility Baseline: Goal status: New Pt  will improve   hip/knee strength to at least 4+/5 MMT to improve functional strength Baseline: Goal status: New Pt will improve FOTO to the predicted value to show improved function Baseline: Goal status: New Pt will reduce pain to overall less than 3/10 with usual activity and work activity. Baseline: Goal status: New Pt will be able to ambulate community distances at least 500 ft WFL gait pattern and LRAD without complaints Baseline: Goal status: New  PLAN: PT FREQUENCY: 2-3 times per week   PT DURATION: 4-6 weeks  PLANNED INTERVENTIONS (unless contraindicated): aquatic PT, Canalith repositioning, cryotherapy, Electrical stimulation, Iontophoresis with 4 mg/ml dexamethasome, Moist heat, traction, Ultrasound, gait training, Therapeutic exercise, balance training, neuromuscular re-education, patient/family education, prosthetic training, manual techniques, passive ROM, dry needling, taping, vasopnuematic device, vestibular, spinal manipulations, joint manipulations  PLAN FOR NEXT SESSION:  Needs left leg strength focus and weight acceptance progressions as pain allows. See if she is interested in water therapy?    NEXT MD VISIT: 10/06/22   Ashley Murrain PT, DPT 09/27/2022 4:13 PM

## 2022-09-27 ENCOUNTER — Ambulatory Visit: Payer: 59 | Admitting: Physical Therapy

## 2022-09-27 ENCOUNTER — Encounter: Payer: Self-pay | Admitting: Physical Therapy

## 2022-09-27 DIAGNOSIS — M79605 Pain in left leg: Secondary | ICD-10-CM | POA: Diagnosis not present

## 2022-09-27 DIAGNOSIS — M6281 Muscle weakness (generalized): Secondary | ICD-10-CM

## 2022-09-27 DIAGNOSIS — R262 Difficulty in walking, not elsewhere classified: Secondary | ICD-10-CM | POA: Diagnosis not present

## 2022-09-28 ENCOUNTER — Other Ambulatory Visit: Payer: Self-pay | Admitting: Internal Medicine

## 2022-09-28 DIAGNOSIS — E782 Mixed hyperlipidemia: Secondary | ICD-10-CM

## 2022-09-28 DIAGNOSIS — K219 Gastro-esophageal reflux disease without esophagitis: Secondary | ICD-10-CM

## 2022-09-28 DIAGNOSIS — G4700 Insomnia, unspecified: Secondary | ICD-10-CM

## 2022-09-28 DIAGNOSIS — J302 Other seasonal allergic rhinitis: Secondary | ICD-10-CM

## 2022-09-28 MED ORDER — ALENDRONATE SODIUM 70 MG PO TABS: 70.0000 mg | ORAL_TABLET | ORAL | 0 refills | Status: DC

## 2022-09-28 MED ORDER — ROSUVASTATIN CALCIUM 20 MG PO TABS
20.0000 mg | ORAL_TABLET | Freq: Every day | ORAL | 0 refills | Status: DC
Start: 2022-09-28 — End: 2022-10-25

## 2022-09-28 MED ORDER — SERTRALINE HCL 50 MG PO TABS
50.0000 mg | ORAL_TABLET | Freq: Every day | ORAL | 0 refills | Status: DC
Start: 2022-09-28 — End: 2022-10-25

## 2022-09-28 MED ORDER — DESLORATADINE 5 MG PO TABS
5.0000 mg | ORAL_TABLET | Freq: Every day | ORAL | 0 refills | Status: DC
Start: 2022-09-28 — End: 2022-10-25

## 2022-09-28 MED ORDER — FLUNISOLIDE 25 MCG/ACT (0.025%) NA SOLN: 2.0000 | Freq: Two times a day (BID) | NASAL | 0 refills | Status: DC

## 2022-09-28 MED ORDER — RABEPRAZOLE SODIUM 20 MG PO TBEC
20.0000 mg | DELAYED_RELEASE_TABLET | Freq: Every day | ORAL | 0 refills | Status: DC
Start: 2022-09-28 — End: 2022-10-25

## 2022-09-28 MED ORDER — AMLODIPINE-OLMESARTAN 10-20 MG PO TABS: 1.0000 | ORAL_TABLET | Freq: Every day | ORAL | 0 refills | Status: DC

## 2022-09-29 ENCOUNTER — Ambulatory Visit: Payer: 59 | Admitting: Rehabilitative and Restorative Service Providers"

## 2022-09-29 ENCOUNTER — Encounter: Payer: Self-pay | Admitting: Rehabilitative and Restorative Service Providers"

## 2022-09-29 DIAGNOSIS — M6281 Muscle weakness (generalized): Secondary | ICD-10-CM | POA: Diagnosis not present

## 2022-09-29 DIAGNOSIS — M79605 Pain in left leg: Secondary | ICD-10-CM | POA: Diagnosis not present

## 2022-09-29 DIAGNOSIS — R262 Difficulty in walking, not elsewhere classified: Secondary | ICD-10-CM

## 2022-09-29 NOTE — Therapy (Signed)
OUTPATIENT PHYSICAL THERAPY TREATMENT   Patient Name: Carol Maldonado MRN: [id number] DOB:1960-09-06, 62 y.o., female Today's Date: 09/29/2022  END OF SESSION:  PT End of Session - 09/29/22 1407     Visit Number 4    Number of Visits 12    Date for PT Re-Evaluation 11/01/22    Authorization Type UHC    Progress Note Due on Visit 10    PT Start Time 1423    PT Stop Time 1503    PT Time Calculation (min) 40 min    Activity Tolerance Patient tolerated treatment well    Behavior During Therapy [facility] for tasks assessed/performed                Past Medical History:  Diagnosis Date   Allergy    Phreesia 02/29/2020   Anxiety    Phreesia 02/29/2020   Hyperlipidemia    Phreesia 02/29/2020   Hypertension    Phreesia 02/29/2020   Insomnia    Osteoporosis    Phreesia 02/29/2020   Seasonal allergies    Past Surgical History:  Procedure Laterality Date   COLONOSCOPY  03/27/2011   Dr. Jena Gauss; entirely normal exam and recommended repeat in 10 years.   COLONOSCOPY WITH PROPOFOL N/A 08/28/2020   Procedure: COLONOSCOPY WITH PROPOFOL;  Surgeon: Corbin Ade, MD;  [facility];  Service: Endoscopy;  Laterality: N/A;  8:30am   FEMUR IM NAIL Left 08/10/2022   Procedure: LEFT FEMUR INTRAMEDULLARY (IM) NAIL;  Surgeon: Cammy Copa, MD;  Location: [facility] OR;  Service: Orthopedics;  Laterality: Left;   Patient Active Problem List   Diagnosis Date Noted   Closed nondisplaced subtrochanteric fracture of femur with delayed healing 08/29/2022   Thigh pain, musculoskeletal, left 06/10/2022   GAD (generalized anxiety disorder) 06/10/2022   Hyperkalemia 11/20/2021   Encounter for general adult medical examination with abnormal findings 11/09/2021   Osteoporosis 03/19/2021   GERD (gastroesophageal reflux disease) 03/19/2021   HLD (hyperlipidemia) 03/19/2021   MDD (major depressive disorder), recurrent, in partial remission (HCC) 03/19/2021   Anemia 11/12/2020   Stage 3b  chronic kidney disease (HCC) 08/05/2020   Colon cancer screening 06/23/2020   Insomnia, unspecified 03/03/2020   Essential hypertension, benign 03/03/2020   Seasonal allergic rhinitis 03/03/2020    PCP: Anabel Halon, MD   REFERRING PROVIDER: Cammy Copa, MD   REFERRING DIAG: [id number] (ICD-10-CM) - Stress fracture of femoral shaft, left, sequela   THERAPY DIAG:  Pain in left leg  Muscle weakness (generalized)  Difficulty in walking, not elsewhere classified  Rationale for Evaluation and Treatment: Rehabilitation  ONSET DATE: left femur intramedullary nail on 08/10/2022   SUBJECTIVE:   SUBJECTIVE STATEMENT: Pt continues to endorse intermittent soreness/aching, feels no pain at present. Otherwise no new updates  PERTINENT HISTORY: s/p left femur fracture, no restrictions, WBAT, osteoporosis  PAIN:  NPRS scale:  no pain on arrival Pain location: Rt thigh Pain description: ache, throb, pull Aggravating factors: getting in/out of car, putting weight on her left leg Relieving factors: over the counter NSAIDs, sitting with leg reclined  PRECAUTIONS: None  WEIGHT BEARING RESTRICTIONS: No  FALLS:  Has patient fallen in last 6 months? 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  Intramedullary nail in good  alignment without any complicating features.  No change in position  compared with intraoperative radiographs.   PATIENT SURVEYS:  09/20/2022 Eval: FOTO functional intake 3%, goal is 47%  COGNITION: 09/20/2022 Overall cognitive status: Within functional limits for tasks assessed     SENSATION: 09/20/2022 [facility]  MUSCLE  LENGTH: 09/20/2022 Eval: Maisie Fus test: Left very tight   PALPATION: 09/20/2022 Eval: TTP in left quads  LOWER EXTREMITY ROM:  Active ROM Right 09/20/2022 Left 09/20/2022  Hip flexion    Hip extension    Hip abduction    Hip adduction    Hip internal rotation    Hip external rotation    Knee flexion 135 125  Knee extension  0  Ankle dorsiflexion    Ankle plantarflexion    Ankle inversion    Ankle eversion     (Blank rows = not tested)  LOWER EXTREMITY MMT:  MMT Right 09/20/2022 Left 09/20/2022 Left 09/29/2022  Hip flexion  3 4/5  Hip extension     Hip abduction  3+   Hip adduction     Hip internal rotation     Hip external rotation     Knee flexion 5 3+ 5/5  Knee extension 5 3+ 4/5  Ankle dorsiflexion   5/5  Ankle plantarflexion     Ankle inversion     Ankle eversion      (Blank rows = not tested)  FUNCTIONAL TESTS:  09/20/2022 Eval: Unable to sit to stand without UE support  GAIT: 09/20/2022   Eval: Distance walked: 75 feet X 2 with bilateral crutches step to pattern, crutches appear to be too short for her but cannot be lengthened any more.                     TODAY'S TREATMENT:                                                                                 DATE:  09/29/2022 Therex: Nustep Lvl 6 UE/LE 8 mins  Leg press double leg 75 lbs x 15 , single leg 2 x 15 37 lbs , performed bilateral Step up forward 4 inch step without rail assist 2 x 15, performed bilaterally Sit to stand to sit 18 inch chair with 5 lb kettle bell x 15 Seated quad set c SLR 2 x 10, performed bilaterally  Neuro Re-ed 6 inch hurdle step over step to gait 10 ft x 5 each LE leading in // bars with hands as needed for loss of balance correction.  6 inch hurdle side to side 10 ft x 5 each LE leading in // bars with hands as needed for loss of balance correction.    TODAY'S TREATMENT:  DATE:  09/27/22 Therapeutic Exercise: Nu step L5 UE/LE 5 min during subjective Seated adductor iso w/ ball 2x10 cues for breath control Seated march LLE only 2x8  STS from slightly raised mat 2x10 no UE support  Attempted SLR, discontinued due to discomfort Quad set on heel prop x15 tactile/verbal cues as needed  for quad LAQ x15 emphasis on eccentric control  Gait training SPC 2x163ft emphasis on sequencing, foot strike, improving trunk mechanics. CGA weaning to SBA SPC 2 laps working on weaving through 6 cones (~70ft each way), CGA for safety, cues for mechanics and pacing SPC 124ft retest with distraction to work on automaticity of gait pattern and reinforce learning, SBA  Education on appropriate SPC height, reinforced using axillary crutches until mechanics/stability with SPC improve, pacing of activities, gait mechanics/sequencing    TODAY'S TREATMENT:                                                                                 DATE:  09/22/2022 Therex: Nustep Lvl 5 UE/LE 8 mins  Leg press double leg 75 lbs x 15 , single leg 2 x 10 37 lbs , performed bilateral Seated quad set 5 sec hold x 10 bilaterally  Sit to stand to sit 18 inch chair s UE assist x 10   Gait Training: SPC training c visual demonstration, verbal cues for techniques and placement of cane in seqeuncing.  Performed in clinic household distances including 100 ft, 25 ft, 50 ft.  Additional time for education on use.   Neuro Re-ed Tandem stance 1 min x 1 bilateral Tandem ambulation fwd/back 10 ft x 5 each way in // bars occasional HHA    PATIENT EDUCATION: Education details: rationale for interventions, safety w/ AD use Person educated: Patient Education method: Explanation, Demonstration, Verbal cues, and Handouts Education comprehension: verbalized understanding and needs further education   HOME EXERCISE PROGRAM: Access Code: [email or web address] URL: [email or web address] Date: 09/20/2022 Prepared by:  Ivery Quale  Exercises - Supine Heel Slide with Strap  - 2 x daily - 6 x weekly - 1-2 sets - 10 reps - 5 hold - Supine Active Straight Leg Raise (Mirrored)  - 2 x daily - 6 x weekly - 1-3 sets - 10 reps - Seated Knee Extension with Resistance  - 2 x daily - 6 x weekly - 1-3 sets - 10 reps - Hip Abduction with Resistance Loop  - 2 x daily - 6 x weekly - 1-3 sets - 10 reps - Standing Hip Flexion with Resistance Loop  - 2 x daily - 6 x weekly - 1-3 sets - 10 reps - Side to Side Weight Shift with Counter Support  - 2 x daily - 6 x weekly - 1-3 sets - 10 reps  ASSESSMENT:  CLINICAL IMPRESSION:  Improving confidence in cane ambulation with good techniques observed in clinic.  Strength reassessment showed improvements compared to eval.  Recommend continued strengthening, balance improvements for return to PLOF.    OBJECTIVE IMPAIRMENTS: decreased activity tolerance, difficulty walking, decreased balance, decreased endurance, decreased mobility, decreased ROM, decreased strength, impaired flexibility, impaired UE/LE use, postural dysfunction, and pain.  ACTIVITY LIMITATIONS: bending, lifting, carry, locomotion, cleaning, community activity, driving,  and or occupation  PERSONAL FACTORS: anx, htn, S/P left femur intramedullary nail on 08/10/2022  are also affecting patient's functional outcome.  REHAB POTENTIAL: Good  CLINICAL DECISION MAKING: Stable/uncomplicated  EVALUATION COMPLEXITY: Low    GOALS: Short term PT Goals Target date: 10/18/2022   Pt will be I and compliant with HEP. Baseline:  Goal status: on going 09/22/2022 Pt will decrease pain by 25% overall Baseline: Goal status: on going 09/22/2022  Long term PT goals Target date:11/01/2022   Pt will improve left knee AROM >130 deg to improve functional mobility Baseline: Goal status: New Pt will improve  hip/knee strength to at least 4+/5 MMT to improve functional strength Baseline: Goal status: New Pt will improve FOTO to  the predicted value to show improved function Baseline: Goal status: New Pt will reduce pain to overall less than 3/10 with usual activity and work activity. Baseline: Goal status: New Pt will be able to ambulate community distances at least 500 ft WFL gait pattern and LRAD without complaints Baseline: Goal status: New  PLAN: PT FREQUENCY: 2-3 times per week   PT DURATION: 4-6 weeks  PLANNED INTERVENTIONS (unless contraindicated): aquatic PT, Canalith repositioning, cryotherapy, Electrical stimulation, Iontophoresis with 4 mg/ml dexamethasome, Moist heat, traction, Ultrasound, gait training, Therapeutic exercise, balance training, neuromuscular re-education, patient/family education, prosthetic training, manual techniques, passive ROM, dry needling, taping, vasopnuematic device, vestibular, spinal manipulations, joint manipulations  PLAN FOR NEXT SESSION:  Continued progressive strengthening, dynamic balance.  Encourage gym activity return.   NEXT MD VISIT: 10/06/22  Chyrel Masson, PT, DPT, OCS, ATC 09/29/22  2:59 PM

## 2022-10-05 ENCOUNTER — Encounter: Payer: Self-pay | Admitting: Rehabilitative and Restorative Service Providers"

## 2022-10-05 ENCOUNTER — Ambulatory Visit: Payer: 59 | Admitting: Rehabilitative and Restorative Service Providers"

## 2022-10-05 DIAGNOSIS — M79605 Pain in left leg: Secondary | ICD-10-CM

## 2022-10-05 DIAGNOSIS — M6281 Muscle weakness (generalized): Secondary | ICD-10-CM | POA: Diagnosis not present

## 2022-10-05 DIAGNOSIS — R262 Difficulty in walking, not elsewhere classified: Secondary | ICD-10-CM | POA: Diagnosis not present

## 2022-10-05 NOTE — Therapy (Signed)
OUTPATIENT PHYSICAL THERAPY TREATMENT   Patient Name: Carol Maldonado MRN: [id number] DOB:01/06/1961, 62 y.o., female Today's Date: 10/05/2022  END OF SESSION:  PT End of Session - 10/05/22 1348     Visit Number 5    Number of Visits 12    Date for PT Re-Evaluation 11/01/22    Authorization Type U[employer]    Progress Note Due on Visit 10    PT Start Time 1343    PT Stop Time 1422    PT Time Calculation (min) 39 min    Activity Tolerance Patient tolerated treatment well    Behavior During Therapy [facility] for tasks assessed/performed                 Past Medical History:  Diagnosis Date   Allergy    Phreesia 02/29/2020   Anxiety    Phreesia 02/29/2020   Hyperlipidemia    Phreesia 02/29/2020   Hypertension    Phreesia 02/29/2020   Insomnia    Osteoporosis    Phreesia 02/29/2020   Seasonal allergies    Past Surgical History:  Procedure Laterality Date   COLONOSCOPY  03/27/2011   Dr. Jena Gauss; entirely normal exam and recommended repeat in 10 years.   COLONOSCOPY WITH PROPOFOL N/A 08/28/2020   Procedure: COLONOSCOPY WITH PROPOFOL;  Surgeon: Corbin Ade, MD;  [facility]: [facility];  Service: Endoscopy;  Laterality: N/A;  8:30am   FEMUR IM NAIL Left 08/10/2022   Procedure: LEFT FEMUR INTRAMEDULLARY (IM) NAIL;  Surgeon: Cammy Copa, MD;  Location: [facility] OR;  Service: Orthopedics;  Laterality: Left;   Patient Active Problem List   Diagnosis Date Noted   Closed nondisplaced subtrochanteric fracture of femur with delayed healing 08/29/2022   Thigh pain, musculoskeletal, left 06/10/2022   GAD (generalized anxiety disorder) 06/10/2022   Hyperkalemia 11/20/2021   Encounter for general adult medical examination with abnormal findings 11/09/2021   Osteoporosis 03/19/2021   GERD (gastroesophageal reflux disease) 03/19/2021   HLD (hyperlipidemia) 03/19/2021   MDD (major depressive disorder), recurrent, in partial remission (HCC) 03/19/2021   Anemia 11/12/2020   Stage 3b  chronic kidney disease (HCC) 08/05/2020   Colon cancer screening 06/23/2020   Insomnia, unspecified 03/03/2020   Essential hypertension, benign 03/03/2020   Seasonal allergic rhinitis 03/03/2020    PCP: Anabel Halon, MD   REFERRING PROVIDER: Cammy Copa, MD   REFERRING DIAG: [phone] (ICD-10-CM) - Stress fracture of femoral shaft, left, sequela   THERAPY DIAG:  Pain in left leg  Muscle weakness (generalized)  Difficulty in walking, not elsewhere classified  Rationale for Evaluation and Treatment: Rehabilitation  ONSET DATE: left femur intramedullary nail on 08/10/2022   SUBJECTIVE:   SUBJECTIVE STATEMENT: She reported getting better overall.  She indicated doing some walking with the cane at times.  Thought the cane was more stable than one crutch.  Reported some throbbing at times but nothing today.  Indicated lifting leg is still tough.   PERTINENT HISTORY: s/p left femur fracture, no restrictions, WBAT, osteoporosis  PAIN:  NPRS scale:  no pain on arrival Pain location: Rt thigh Pain description: ache, throb, pull Aggravating factors: getting in/out of car, putting weight on her left leg Relieving factors: over the counter NSAIDs, sitting with leg reclined  PRECAUTIONS: None  WEIGHT BEARING RESTRICTIONS: No  FALLS:  Has patient fallen in last 6 months? 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  Intramedullary nail in good  alignment without any complicating features.  No change in position  compared with intraoperative radiographs.   PATIENT SURVEYS:  09/20/2022 Eval: FOTO functional intake 3%, goal is  47%  COGNITION: 09/20/2022 Overall cognitive status: Within functional limits for tasks assessed     SENSATION: 09/20/2022 [facility]  MUSCLE LENGTH: 09/20/2022 Eval: Maisie Fus test: Left very tight   PALPATION: 09/20/2022 Eval: TTP in left quads  LOWER EXTREMITY ROM:  Active ROM Right 09/20/2022 Left 09/20/2022   Hip flexion     Hip extension     Hip abduction     Hip adduction     Hip internal rotation     Hip external rotation     Knee flexion 135 125   Knee extension  0   Ankle dorsiflexion     Ankle plantarflexion     Ankle inversion     Ankle eversion      (Blank rows = not tested)  LOWER EXTREMITY MMT:  MMT Right 09/20/2022 Left 09/20/2022 Left 09/29/2022  Hip flexion  3 4/5  Hip extension     Hip abduction  3+   Hip adduction     Hip internal rotation     Hip external rotation     Knee flexion 5 3+ 5/5  Knee extension 5 3+ 4/5  Ankle dorsiflexion   5/5  Ankle plantarflexion     Ankle inversion     Ankle eversion      (Blank rows = not tested)  FUNCTIONAL TESTS:  10/05/2022:  Able to perform without UE assist  09/20/2022 Eval: Unable to sit to stand without UE support  GAIT: 09/20/2022   Eval: Distance walked: 75 feet X 2 with bilateral crutches step to pattern, crutches appear to be too short for her but cannot be lengthened any more.                     TODAY'S TREATMENT:                                                                                 DATE:  10/05/2022 Therex: Nustep Lvl 6 UE/LE 8 mins  Leg press double leg 81 lbs x 25 , single leg 2 x 15 37 lbs , performed bilateral Step up forward 6 inch step without rail assist x 15 Seated SLR Lt 3 x 10  Sit to stand to sit 8 lb weight in UE x 15  TherActivity Use of SPC with occasional cues for sequencing with supervision during walking within clinic household distances between activity Ramp and curb (6 inch) with SPC and cues for sequencing trial x 2 each  Neuro Re-ed SLS on black mat  with contralateral leg touching corner of mat x 6 each corner, bilaterally Tandem stance on foam in // bars with occasional HHA 1 min x 1 bilateral  TODAY'S TREATMENT:  DATE:  09/29/2022 Therex: Nustep Lvl 6 UE/LE 8 mins  Leg press double leg 75 lbs x 15 , single leg 2 x 15 37 lbs , performed bilateral Step up forward 4 inch step without rail assist 2 x 15, performed bilaterally Sit to stand to sit 18 inch chair with 5 lb kettle bell x 15 Seated quad set c SLR 2 x 10, performed bilaterally  Neuro Re-ed 6 inch hurdle step over step to gait 10 ft x 5 each LE leading in // bars with hands as needed for loss of balance correction.  6 inch hurdle side to side 10 ft x 5 each LE leading in // bars with hands as needed for loss of balance correction.    TODAY'S TREATMENT:                                                                                 DATE: 09/27/22 Therapeutic Exercise: Nu step L5 UE/LE 5 min during subjective Seated adductor iso w/ ball 2x10 cues for breath control Seated march LLE only 2x8  STS from slightly raised mat 2x10 no UE support  Attempted SLR, discontinued due to discomfort Quad set on heel prop x15 tactile/verbal cues as needed  for quad LAQ x15 emphasis on eccentric control  Gait training SPC 2x171ft emphasis on sequencing, foot strike, improving trunk mechanics. CGA weaning to SBA SPC 2 laps working on weaving through 6 cones (~50ft each way), CGA for safety, cues for mechanics and pacing SPC 167ft retest with distraction to work on automaticity of gait pattern and reinforce learning, SBA  Education on appropriate SPC height, reinforced using axillary crutches until mechanics/stability with SPC improve, pacing of activities, gait mechanics/sequencing    TODAY'S TREATMENT:                                                                                 DATE:  09/22/2022 Therex: Nustep  Lvl 5 UE/LE 8 mins  Leg press double leg 75 lbs x 15 , single leg 2 x 10 37 lbs , performed bilateral Seated quad set 5 sec hold x 10 bilaterally  Sit to stand to sit 18 inch chair s UE assist x 10   Gait Training: SPC training c visual demonstration, verbal cues for techniques and placement of cane in seqeuncing.  Performed in clinic household distances including 100 ft, 25 ft, 50 ft.  Additional time for education on use.   Neuro Re-ed Tandem stance 1 min x 1 bilateral Tandem ambulation fwd/back 10 ft x 5 each way in // bars occasional HHA    PATIENT EDUCATION: 10/05/2022 Education details: HEP update Person educated: Patient Education method: Programmer, multimedia, Demonstration, Verbal cues, and Handouts Education comprehension: verbalized understanding and needs further education   HOME EXERCISE PROGRAM: Access Code: [email or web address] URL: [email or web address] Date: 10/05/2022 Prepared by: Chyrel Masson  Exercises - Supine Heel Slide  with Strap  - 2 x daily - 6 x weekly - 1-2 sets - 10 reps - 5 hold - Supine Active Straight Leg Raise (Mirrored)  - 2 x daily - 6 x weekly - 1-3 sets - 10 reps - Seated Knee Extension with Resistance  - 2 x daily - 6 x weekly - 1-3 sets - 10 reps - Hip Abduction with Resistance Loop  - 1 x daily - 4-5 x weekly - 1-3 sets - 10 reps - Standing Hip Flexion with Resistance Loop  - 1 x daily - 4-5 x weekly - 1-3 sets - 10 reps - Side to Side Weight Shift with Counter Support  - 1 x daily - 4-5 x weekly - 1-3 sets - 10 reps - Seated Straight Leg Heel Taps  - 1-2 x daily - 7 x weekly - 3 sets - 10 reps - Sit to Stand  - 3 x daily - 7 x weekly - 1 sets - 10 reps  ASSESSMENT:  CLINICAL IMPRESSION:  Continued to emphasize SPC use due to improvements.  Transfer ability improved without UE assist.  Continued progressive strengthening and balance improvements indicated at this time with skilled PT services and HEP.    OBJECTIVE IMPAIRMENTS: decreased  activity tolerance, difficulty walking, decreased balance, decreased endurance, decreased mobility, decreased ROM, decreased strength, impaired flexibility, impaired UE/LE use, postural dysfunction, and pain.  ACTIVITY LIMITATIONS: bending, lifting, carry, locomotion, cleaning, community activity, driving, and or occupation  PERSONAL FACTORS: anx, htn, S/P left femur intramedullary nail on 08/10/2022  are also affecting patient's functional outcome.  REHAB POTENTIAL: Good  CLINICAL DECISION MAKING: Stable/uncomplicated  EVALUATION COMPLEXITY: Low    GOALS: Short term PT Goals Target date: 10/18/2022   Pt will be I and compliant with HEP. Baseline:  Goal status: on going 09/22/2022 Pt will decrease pain by 25% overall Baseline: Goal status: on going 09/22/2022  Long term PT goals Target date:11/01/2022   Pt will improve left knee AROM >130 deg to improve functional mobility Baseline: Goal status: New Pt will improve  hip/knee strength to at least 4+/5 MMT to improve functional strength Baseline: Goal status: New Pt will improve FOTO to the predicted value to show improved function Baseline: Goal status: New Pt will reduce pain to overall less than 3/10 with usual activity and work activity. Baseline: Goal status: New Pt will be able to ambulate community distances at least 500 ft WFL gait pattern and LRAD without complaints Baseline: Goal status: New  PLAN: PT FREQUENCY: 2-3 times per week   PT DURATION: 4-6 weeks  PLANNED INTERVENTIONS (unless contraindicated): aquatic PT, Canalith repositioning, cryotherapy, Electrical stimulation, Iontophoresis with 4 mg/ml dexamethasome, Moist heat, traction, Ultrasound, gait training, Therapeutic exercise, balance training, neuromuscular re-education, patient/family education, prosthetic training, manual techniques, passive ROM, dry needling, taping, vasopnuematic device, vestibular, spinal manipulations, joint manipulations  PLAN FOR  NEXT SESSION:  Continued progressive strengthening, dynamic balance.  Encourage gym activity return.   NEXT MD VISIT: 10/06/22  Chyrel Masson, PT, DPT, OCS, ATC 10/05/22  2:19 PM

## 2022-10-06 ENCOUNTER — Encounter: Payer: 59 | Admitting: Surgical

## 2022-10-07 ENCOUNTER — Ambulatory Visit: Payer: 59 | Admitting: Physical Therapy

## 2022-10-07 ENCOUNTER — Encounter: Payer: Self-pay | Admitting: Physical Therapy

## 2022-10-07 DIAGNOSIS — R262 Difficulty in walking, not elsewhere classified: Secondary | ICD-10-CM | POA: Diagnosis not present

## 2022-10-07 DIAGNOSIS — M6281 Muscle weakness (generalized): Secondary | ICD-10-CM

## 2022-10-07 DIAGNOSIS — M79605 Pain in left leg: Secondary | ICD-10-CM

## 2022-10-07 NOTE — Therapy (Signed)
OUTPATIENT PHYSICAL THERAPY TREATMENT   Patient Name: KEYLA MILONE MRN: [id number] DOB:[birth year], 62 y.o., female Today's Date: 10/07/2022  END OF SESSION:  PT End of Session - 10/07/22 1614     Visit Number 6    Number of Visits 12    Date for PT Re-Evaluation 11/01/22    Authorization Type UHC    Progress Note Due on Visit 10    PT Start Time 1557    PT Stop Time 1635    PT Time Calculation (min) 38 min    Activity Tolerance Patient tolerated treatment well    Behavior During Therapy [facility] for tasks assessed/performed                 Past Medical History:  Diagnosis Date   Allergy    Phreesia 02/29/2020   Anxiety    Phreesia 02/29/2020   Hyperlipidemia    Phreesia 02/29/2020   Hypertension    Phreesia 02/29/2020   Insomnia    Osteoporosis    Phreesia 02/29/2020   Seasonal allergies    Past Surgical History:  Procedure Laterality Date   COLONOSCOPY  03/27/2011   Dr. Jena Gauss; entirely normal exam and recommended repeat in 10 years.   COLONOSCOPY WITH PROPOFOL N/A 08/28/2020   Procedure: COLONOSCOPY WITH PROPOFOL;  Surgeon: Corbin Ade, MD;  [facility];  Service: Endoscopy;  Laterality: N/A;  8:30am   FEMUR IM NAIL Left 08/10/2022   Procedure: LEFT FEMUR INTRAMEDULLARY (IM) NAIL;  Surgeon: Cammy Copa, MD;  Location: [facility] OR;  Service: Orthopedics;  Laterality: Left;   Patient Active Problem List   Diagnosis Date Noted   Closed nondisplaced subtrochanteric fracture of femur with delayed healing 08/29/2022   Thigh pain, musculoskeletal, left 06/10/2022   GAD (generalized anxiety disorder) 06/10/2022   Hyperkalemia 11/20/2021   Encounter for general adult medical examination with abnormal findings 11/09/2021   Osteoporosis 03/19/2021   GERD (gastroesophageal reflux disease) 03/19/2021   HLD (hyperlipidemia) 03/19/2021   MDD (major depressive disorder), recurrent, in partial remission (HCC) 03/19/2021   Anemia 11/12/2020   Stage 3b  chronic kidney disease (HCC) 08/05/2020   Colon cancer screening 06/23/2020   Insomnia, unspecified 03/03/2020   Essential hypertension, benign 03/03/2020   Seasonal allergic rhinitis 03/03/2020    PCP: Anabel Halon, MD   REFERRING PROVIDER: Cammy Copa, MD   REFERRING DIAG: [phone] (ICD-10-CM) - Stress fracture of femoral shaft, left, sequela   THERAPY DIAG:  Pain in left leg  Muscle weakness (generalized)  Difficulty in walking, not elsewhere classified  Rationale for Evaluation and Treatment: Rehabilitation  ONSET DATE: left femur intramedullary nail on 08/10/2022   SUBJECTIVE:   SUBJECTIVE STATEMENT: She reported soreness in her thigh upon arrival.   PERTINENT HISTORY: s/p left femur fracture, no restrictions, WBAT, osteoporosis  PAIN:  NPRS scale:  4/10 soreness/pain Pain location: Rt thigh Pain description: ache, throb, pull Aggravating factors: getting in/out of car, putting weight on her left leg Relieving factors: over the counter NSAIDs, sitting with leg reclined  PRECAUTIONS: None  WEIGHT BEARING RESTRICTIONS: No  FALLS:  Has patient fallen in last 6 months? 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  Intramedullary  nail in good  alignment without any complicating features.  No change in position  compared with intraoperative radiographs.   PATIENT SURVEYS:  09/20/2022 Eval: FOTO functional intake 3%, goal is 47%  COGNITION: 09/20/2022 Overall cognitive status: Within functional limits for tasks assessed     SENSATION: 09/20/2022 [facility]  MUSCLE LENGTH: 09/20/2022 Eval: Maisie Fus test: Left very  tight   PALPATION: 09/20/2022 Eval: TTP in left quads  LOWER EXTREMITY ROM:  Active ROM Right 09/20/2022 Left 09/20/2022   Hip flexion     Hip extension     Hip abduction     Hip adduction     Hip internal rotation     Hip external rotation     Knee flexion 135 125   Knee extension  0   Ankle dorsiflexion     Ankle plantarflexion     Ankle inversion     Ankle eversion      (Blank rows = not tested)  LOWER EXTREMITY MMT:  MMT Right 09/20/2022 Left 09/20/2022 Left 09/29/2022  Hip flexion  3 4/5  Hip extension     Hip abduction  3+   Hip adduction     Hip internal rotation     Hip external rotation     Knee flexion 5 3+ 5/5  Knee extension 5 3+ 4/5  Ankle dorsiflexion   5/5  Ankle plantarflexion     Ankle inversion     Ankle eversion      (Blank rows = not tested)  FUNCTIONAL TESTS:  10/05/2022:  Able to perform without UE assist  09/20/2022 Eval: Unable to sit to stand without UE support  GAIT: 09/20/2022   Eval: Distance walked: 75 feet X 2 with bilateral crutches step to pattern, crutches appear to be too short for her but cannot be lengthened any more.                     TODAY'S TREATMENT:                                                                                  DATE:  10/07/2022 Therex: Sci fit bike UE/LE X 8 min L3 Leg press double leg 81 lbs 2X15 , single leg 2 x 15 37 lbs , performed bilateral Step up forward 6 inch step without rail assist x 15 Seated SLR Lt 3 x 10  Sit to stand to sit 8 lb weight in UE x 15 Gait with SPC throughout clinic    Neuro Re-ed SLS 10 sec X 3 bilat Tandem stance on foam in // bars with occasional HHA 1 min x 1 bilateral  DATE:  10/05/2022 Therex: Nustep Lvl 6 UE/LE 8 mins  Leg press double leg 81 lbs x 25 , single leg 2 x 15 37 lbs , performed bilateral Step up forward 6 inch step without rail assist x 15 Seated SLR Lt 3 x 10  Sit to stand to sit 8 lb weight in UE x 15  TherActivity Use of SPC with  occasional cues for sequencing with supervision during walking within clinic household distances between activity Ramp and curb (6 inch) with SPC and cues for sequencing  trial x 2 each  Neuro Re-ed SLS on black mat with contralateral leg touching corner of mat x 6 each corner, bilaterally Tandem stance on foam in // bars with occasional HHA 1 min x 1 bilateral  TODAY'S TREATMENT:                                                                                 DATE:  09/29/2022 Therex: Nustep Lvl 6 UE/LE 8 mins  Leg press double leg 75 lbs x 15 , single leg 2 x 15 37 lbs , performed bilateral Step up forward 4 inch step without rail assist 2 x 15, performed bilaterally Sit to stand to sit 18 inch chair with 5 lb kettle bell x 15 Seated quad set c SLR 2 x 10, performed bilaterally  Neuro Re-ed 6 inch hurdle step over step to gait 10 ft x 5 each LE leading in // bars with hands as needed for loss of balance correction.  6 inch hurdle side to side 10 ft x 5 each LE leading in // bars with hands as needed for loss of balance correction.     PATIENT EDUCATION: 10/05/2022 Education details: HEP update Person educated: Patient Education method: Programmer, multimedia, Demonstration, Verbal cues, and Handouts Education comprehension: verbalized understanding and needs further education   HOME EXERCISE PROGRAM: Access Code: [email or web address] URL: [email or web address] Date: 10/05/2022 Prepared by: Chyrel Masson  Exercises - Supine Heel Slide with Strap  - 2 x daily - 6 x weekly - 1-2 sets - 10 reps - 5 hold - Supine Active Straight Leg Raise (Mirrored)  - 2 x daily - 6 x weekly - 1-3 sets - 10 reps - Seated Knee Extension with Resistance  - 2 x daily - 6 x weekly - 1-3 sets - 10 reps - Hip Abduction with Resistance Loop  - 1 x daily - 4-5 x weekly - 1-3 sets - 10 reps - Standing Hip Flexion with Resistance Loop  - 1 x daily - 4-5 x weekly - 1-3 sets - 10 reps - Side to Side Weight Shift with  Counter Support  - 1 x daily - 4-5 x weekly - 1-3 sets - 10 reps - Seated Straight Leg Heel Taps  - 1-2 x daily - 7 x weekly - 3 sets - 10 reps - Sit to Stand  - 3 x daily - 7 x weekly - 1 sets - 10 reps  ASSESSMENT:  CLINICAL IMPRESSION: She appears to be overall progressing well with her leg strengthening, balance and gait but still with functional deficits. PT recommending to continue with current plan.    OBJECTIVE IMPAIRMENTS: decreased activity tolerance, difficulty walking, decreased balance, decreased endurance, decreased mobility, decreased ROM, decreased strength, impaired flexibility, impaired UE/LE use, postural dysfunction, and pain.  ACTIVITY LIMITATIONS: bending, lifting, carry, locomotion, cleaning, community activity, driving, and or occupation  PERSONAL FACTORS: anx, htn, S/P left femur intramedullary nail on 08/10/2022  are also affecting patient's functional outcome.  REHAB POTENTIAL: Good  CLINICAL DECISION MAKING: Stable/uncomplicated  EVALUATION COMPLEXITY: Low    GOALS: Short term PT Goals Target date: 10/18/2022   Pt will be I and compliant with HEP. Baseline:  Goal status:  on going 09/22/2022 Pt will decrease pain by 25% overall Baseline: Goal status: on going 09/22/2022  Long term PT goals Target date:11/01/2022   Pt will improve left knee AROM >130 deg to improve functional mobility Baseline: Goal status: New Pt will improve  hip/knee strength to at least 4+/5 MMT to improve functional strength Baseline: Goal status: New Pt will improve FOTO to the predicted value to show improved function Baseline: Goal status: New Pt will reduce pain to overall less than 3/10 with usual activity and work activity. Baseline: Goal status: New Pt will be able to ambulate community distances at least 500 ft WFL gait pattern and LRAD without complaints Baseline: Goal status: New  PLAN: PT FREQUENCY: 2-3 times per week   PT DURATION: 4-6 weeks  PLANNED  INTERVENTIONS (unless contraindicated): aquatic PT, Canalith repositioning, cryotherapy, Electrical stimulation, Iontophoresis with 4 mg/ml dexamethasome, Moist heat, traction, Ultrasound, gait training, Therapeutic exercise, balance training, neuromuscular re-education, patient/family education, prosthetic training, manual techniques, passive ROM, dry needling, taping, vasopnuematic device, vestibular, spinal manipulations, joint manipulations  PLAN FOR NEXT SESSION:  Continued progressive strengthening, dynamic balance.  Encourage gym activity return.   NEXT MD VISIT: 10/25/22  Ivery Quale, PT, DPT 10/07/22 4:30 PM

## 2022-10-12 ENCOUNTER — Ambulatory Visit: Payer: 59 | Admitting: Physical Therapy

## 2022-10-12 ENCOUNTER — Encounter: Payer: Self-pay | Admitting: Physical Therapy

## 2022-10-12 DIAGNOSIS — M6281 Muscle weakness (generalized): Secondary | ICD-10-CM

## 2022-10-12 DIAGNOSIS — R262 Difficulty in walking, not elsewhere classified: Secondary | ICD-10-CM | POA: Diagnosis not present

## 2022-10-12 DIAGNOSIS — M79605 Pain in left leg: Secondary | ICD-10-CM | POA: Diagnosis not present

## 2022-10-12 NOTE — Therapy (Signed)
OUTPATIENT PHYSICAL THERAPY TREATMENT   Patient Name: Carol Maldonado MRN: [id number] DOB:1960/09/13, 62 y.o., female Today's Date: 10/12/2022  END OF SESSION:  PT End of Session - 10/12/22 1516     Visit Number 7    Number of Visits 12    Date for PT Re-Evaluation 11/01/22    Authorization Type U[employer]    Progress Note Due on Visit 10    PT Start Time 1509    PT Stop Time 1550    PT Time Calculation (min) 41 min    Activity Tolerance Patient tolerated treatment well    Behavior During Therapy [facility] for tasks assessed/performed                 Past Medical History:  Diagnosis Date   Allergy    Phreesia 02/29/2020   Anxiety    Phreesia 02/29/2020   Hyperlipidemia    Phreesia 02/29/2020   Hypertension    Phreesia 02/29/2020   Insomnia    Osteoporosis    Phreesia 02/29/2020   Seasonal allergies    Past Surgical History:  Procedure Laterality Date   COLONOSCOPY  03/27/2011   Dr. Jena Gauss; entirely normal exam and recommended repeat in 10 years.   COLONOSCOPY WITH PROPOFOL N/A 08/28/2020   Procedure: COLONOSCOPY WITH PROPOFOL;  Surgeon: Corbin Ade, MD;  [facility];  Service: Endoscopy;  Laterality: N/A;  8:30am   FEMUR IM NAIL Left 08/10/2022   Procedure: LEFT FEMUR INTRAMEDULLARY (IM) NAIL;  Surgeon: Cammy Copa, MD;  Location: [facility] OR;  Service: Orthopedics;  Laterality: Left;   Patient Active Problem List   Diagnosis Date Noted   Closed nondisplaced subtrochanteric fracture of femur with delayed healing 08/29/2022   Thigh pain, musculoskeletal, left 06/10/2022   GAD (generalized anxiety disorder) 06/10/2022   Hyperkalemia 11/20/2021   Encounter for general adult medical examination with abnormal findings 11/09/2021   Osteoporosis 03/19/2021   GERD (gastroesophageal reflux disease) 03/19/2021   HLD (hyperlipidemia) 03/19/2021   MDD (major depressive disorder), recurrent, in partial remission (HCC) 03/19/2021   Anemia 11/12/2020   Stage 3b  chronic kidney disease (HCC) 08/05/2020   Colon cancer screening 06/23/2020   Insomnia, unspecified 03/03/2020   Essential hypertension, benign 03/03/2020   Seasonal allergic rhinitis 03/03/2020    PCP: Anabel Halon, MD   REFERRING PROVIDER: Cammy Copa, MD   REFERRING DIAG: [phone] (ICD-10-CM) - Stress fracture of femoral shaft, left, sequela   THERAPY DIAG:  Pain in left leg  Difficulty in walking, not elsewhere classified  Muscle weakness (generalized)  Rationale for Evaluation and Treatment: Rehabilitation  ONSET DATE: left femur intramedullary nail on 08/10/2022   SUBJECTIVE:   SUBJECTIVE STATEMENT: She relays still with a little pain overall  PERTINENT HISTORY: s/p left femur fracture, no restrictions, WBAT, osteoporosis  PAIN:  NPRS scale:  3/10 soreness/pain Pain location: Rt thigh Pain description: ache, throb, pull Aggravating factors: getting in/out of car, putting weight on her left leg Relieving factors: over the counter NSAIDs, sitting with leg reclined  PRECAUTIONS: None  WEIGHT BEARING RESTRICTIONS: No  FALLS:  Has patient fallen in last 6 months? 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  Intramedullary nail  in good  alignment without any complicating features.  No change in position  compared with intraoperative radiographs.   PATIENT SURVEYS:  09/20/2022 Eval: FOTO functional intake 3%, goal is 47%  COGNITION: 09/20/2022 Overall cognitive status: Within functional limits for tasks assessed     SENSATION: 09/20/2022 [facility]  MUSCLE LENGTH: 09/20/2022 Eval: Maisie Fus test: Left very  tight   PALPATION: 09/20/2022 Eval: TTP in left quads  LOWER EXTREMITY ROM:  Active ROM Right 09/20/2022 Left 09/20/2022   Hip flexion     Hip extension     Hip abduction     Hip adduction     Hip internal rotation     Hip external rotation     Knee flexion 135 125   Knee extension  0   Ankle dorsiflexion     Ankle plantarflexion     Ankle inversion     Ankle eversion      (Blank rows = not tested)  LOWER EXTREMITY MMT:  MMT Right 09/20/2022 Left 09/20/2022 Left 09/29/2022  Hip flexion  3 4/5  Hip extension     Hip abduction  3+   Hip adduction     Hip internal rotation     Hip external rotation     Knee flexion 5 3+ 5/5  Knee extension 5 3+ 4/5  Ankle dorsiflexion   5/5  Ankle plantarflexion     Ankle inversion     Ankle eversion      (Blank rows = not tested)  FUNCTIONAL TESTS:  10/05/2022:  Able to perform without UE assist  09/20/2022 Eval: Unable to sit to stand without UE support  GAIT: 09/20/2022   Eval: Distance walked: 75 feet X 2 with bilateral crutches step to pattern, crutches appear to be too short for her but cannot be lengthened any more.                     TODAY'S TREATMENT:                                                                                  DATE:  10/12/2022 Therex: Nu step L6 X 10 min UE/LE Leg press double leg 81 lbs 2X15 , single leg 2 x 15 37 lbs , performed bilateral Step up forward 6 inch step without rail assist x 15 Seated SLR Lt 3 x 10  Sit to stand to sit 8 lb weight in UE 2 X 10 Seated knee extension machine 5# up with both and down with left only X 2X10 Gait with SPC throughout clinic    Neuro Re-ed SLS 10 sec X 3 bilat Tandem stance on foam in // bars with occasional HHA 1 min x 1 bilateral Stepping over 6 inch hurdle forward and back X 10 bilat without UE support  DATE:  10/07/2022 Therex: Sci fit bike UE/LE X 8 min L3 Leg press double leg 81 lbs 2X15 , single leg 2 x 15 37 lbs , performed  bilateral Step up forward 6 inch step without rail assist x 15 Seated SLR Lt 3 x 10  Sit to stand to sit 8 lb weight in UE x 15 Gait with SPC  throughout clinic    Neuro Re-ed SLS 10 sec X 3 bilat Tandem stance on foam in // bars with occasional HHA 1 min x 1 bilateral   PATIENT EDUCATION: 10/05/2022 Education details: HEP update Person educated: Patient Education method: Programmer, multimedia, Demonstration, Verbal cues, and Handouts Education comprehension: verbalized understanding and needs further education   HOME EXERCISE PROGRAM: Access Code: [email or web address] URL: [email or web address] Date: 10/05/2022 Prepared by: Chyrel Masson  Exercises - Supine Heel Slide with Strap  - 2 x daily - 6 x weekly - 1-2 sets - 10 reps - 5 hold - Supine Active Straight Leg Raise (Mirrored)  - 2 x daily - 6 x weekly - 1-3 sets - 10 reps - Seated Knee Extension with Resistance  - 2 x daily - 6 x weekly - 1-3 sets - 10 reps - Hip Abduction with Resistance Loop  - 1 x daily - 4-5 x weekly - 1-3 sets - 10 reps - Standing Hip Flexion with Resistance Loop  - 1 x daily - 4-5 x weekly - 1-3 sets - 10 reps - Side to Side Weight Shift with Counter Support  - 1 x daily - 4-5 x weekly - 1-3 sets - 10 reps - Seated Straight Leg Heel Taps  - 1-2 x daily - 7 x weekly - 3 sets - 10 reps - Sit to Stand  - 3 x daily - 7 x weekly - 1 sets - 10 reps  ASSESSMENT:  CLINICAL IMPRESSION: She had good tolerance to strength progressions today. We are working to wean off of [facility] for ambulation as her pain allows.    OBJECTIVE IMPAIRMENTS: decreased activity tolerance, difficulty walking, decreased balance, decreased endurance, decreased mobility, decreased ROM, decreased strength, impaired flexibility, impaired UE/LE use, postural dysfunction, and pain.  ACTIVITY LIMITATIONS: bending, lifting, carry, locomotion, cleaning, community activity, driving, and or occupation  PERSONAL FACTORS: anx, htn, S/P left femur  intramedullary nail on 08/10/2022  are also affecting patient's functional outcome.  REHAB POTENTIAL: Good  CLINICAL DECISION MAKING: Stable/uncomplicated  EVALUATION COMPLEXITY: Low    GOALS: Short term PT Goals Target date: 10/18/2022   Pt will be I and compliant with HEP. Baseline:  Goal status: MET 10/12/22 Pt will decrease pain by 25% overall Baseline: Goal status: MET 10/12/22  Long term PT goals Target date:11/01/2022   Pt will improve left knee AROM >130 deg to improve functional mobility Baseline: Goal status: New Pt will improve  hip/knee strength to at least 4+/5 MMT to improve functional strength Baseline: Goal status: New Pt will improve FOTO to the predicted value to show improved function Baseline: Goal status: New Pt will reduce pain to overall less than 3/10 with usual activity and work activity. Baseline: Goal status: New Pt will be able to ambulate community distances at least 500 ft WFL gait pattern and LRAD without complaints Baseline: Goal status: New  PLAN: PT FREQUENCY: 2-3 times per week   PT DURATION: 4-6 weeks  PLANNED INTERVENTIONS (unless contraindicated): aquatic PT, Canalith repositioning, cryotherapy, Electrical stimulation, Iontophoresis with 4 mg/ml dexamethasome, Moist heat, traction, Ultrasound, gait training, Therapeutic exercise, balance training, neuromuscular re-education, patient/family education, prosthetic training, manual techniques, passive ROM, dry needling, taping, vasopnuematic device, vestibular, spinal manipulations, joint manipulations  PLAN FOR NEXT SESSION:  Continued progressive strengthening, dynamic balance.  Encourage gym activity return.   NEXT MD VISIT: 10/25/22  Ivery Quale, PT, DPT 10/12/22 3:17 PM

## 2022-10-14 ENCOUNTER — Ambulatory Visit: Payer: 59 | Admitting: Internal Medicine

## 2022-10-14 ENCOUNTER — Ambulatory Visit: Payer: 59 | Admitting: Physical Therapy

## 2022-10-14 ENCOUNTER — Encounter: Payer: Self-pay | Admitting: Physical Therapy

## 2022-10-14 DIAGNOSIS — M79605 Pain in left leg: Secondary | ICD-10-CM | POA: Diagnosis not present

## 2022-10-14 DIAGNOSIS — M6281 Muscle weakness (generalized): Secondary | ICD-10-CM

## 2022-10-14 DIAGNOSIS — R262 Difficulty in walking, not elsewhere classified: Secondary | ICD-10-CM

## 2022-10-14 NOTE — Therapy (Signed)
OUTPATIENT PHYSICAL THERAPY TREATMENT   Patient Name: Carol Maldonado MRN: [id number] DOB:05-03-1960, 62 y.o., female Today's Date: 10/14/2022  END OF SESSION:  PT End of Session - 10/14/22 1431     Visit Number 8    Number of Visits 12    Date for PT Re-Evaluation 11/01/22    Authorization Type UHC    Progress Note Due on Visit 10    PT Start Time 1415    PT Stop Time 1456    PT Time Calculation (min) 41 min    Activity Tolerance Patient tolerated treatment well    Behavior During Therapy [facility] for tasks assessed/performed                  Past Medical History:  Diagnosis Date   Allergy    Phreesia 02/29/2020   Anxiety    Phreesia 02/29/2020   Hyperlipidemia    Phreesia 02/29/2020   Hypertension    Phreesia 02/29/2020   Insomnia    Osteoporosis    Phreesia 02/29/2020   Seasonal allergies    Past Surgical History:  Procedure Laterality Date   COLONOSCOPY  03/27/2011   Dr. Jena Gauss; entirely normal exam and recommended repeat in 10 years.   COLONOSCOPY WITH PROPOFOL N/A 08/28/2020   Procedure: COLONOSCOPY WITH PROPOFOL;  Surgeon: Corbin Ade, MD;  [facility];  Service: Endoscopy;  Laterality: N/A;  8:30am   FEMUR IM NAIL Left 08/10/2022   Procedure: LEFT FEMUR INTRAMEDULLARY (IM) NAIL;  Surgeon: Cammy Copa, MD;  Location: [facility] OR;  Service: Orthopedics;  Laterality: Left;   Patient Active Problem List   Diagnosis Date Noted   Closed nondisplaced subtrochanteric fracture of femur with delayed healing 08/29/2022   Thigh pain, musculoskeletal, left 06/10/2022   GAD (generalized anxiety disorder) 06/10/2022   Hyperkalemia 11/20/2021   Encounter for general adult medical examination with abnormal findings 11/09/2021   Osteoporosis 03/19/2021   GERD (gastroesophageal reflux disease) 03/19/2021   HLD (hyperlipidemia) 03/19/2021   MDD (major depressive disorder), recurrent, in partial remission (HCC) 03/19/2021   Anemia 11/12/2020   Stage 3b  chronic kidney disease (HCC) 08/05/2020   Colon cancer screening 06/23/2020   Insomnia, unspecified 03/03/2020   Essential hypertension, benign 03/03/2020   Seasonal allergic rhinitis 03/03/2020    PCP: Anabel Halon, MD   REFERRING PROVIDER: Cammy Copa, MD   REFERRING DIAG: [phone] (ICD-10-CM) - Stress fracture of femoral shaft, left, sequela   THERAPY DIAG:  Pain in left leg  Difficulty in walking, not elsewhere classified  Muscle weakness (generalized)  Rationale for Evaluation and Treatment: Rehabilitation  ONSET DATE: left femur intramedullary nail on 08/10/2022   SUBJECTIVE:   SUBJECTIVE STATEMENT: She relays she is working on walking more without the cane  PERTINENT HISTORY: s/p left femur fracture, no restrictions, WBAT, osteoporosis  PAIN:  NPRS scale:  3/10 soreness/pain Pain location: Rt thigh Pain description: ache, throb, pull Aggravating factors: getting in/out of car, putting weight on her left leg Relieving factors: over the counter NSAIDs, sitting with leg reclined  PRECAUTIONS: None  WEIGHT BEARING RESTRICTIONS: No  FALLS:  Has patient fallen in last 6 months? 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  Intramedullary nail in good  alignment without any complicating features.  No change in position  compared with intraoperative radiographs.   PATIENT SURVEYS:  09/20/2022 Eval: FOTO functional intake 3%, goal is 47%  COGNITION: 09/20/2022 Overall cognitive status: Within functional limits for tasks assessed     SENSATION: 09/20/2022 [facility]  MUSCLE LENGTH: 09/20/2022 Eval: Maisie Fus test: Left very  tight   PALPATION: 09/20/2022 Eval: TTP in left quads  LOWER EXTREMITY ROM:  Active ROM Right 09/20/2022 Left 09/20/2022   Hip flexion     Hip extension     Hip abduction     Hip adduction     Hip internal rotation     Hip external rotation     Knee flexion 135 125   Knee extension  0   Ankle dorsiflexion     Ankle plantarflexion     Ankle inversion     Ankle eversion      (Blank rows = not tested)  LOWER EXTREMITY MMT:  MMT Right 09/20/2022 Left 09/20/2022 Left 09/29/2022  Hip flexion  3 4/5  Hip extension     Hip abduction  3+   Hip adduction     Hip internal rotation     Hip external rotation     Knee flexion 5 3+ 5/5  Knee extension 5 3+ 4/5  Ankle dorsiflexion   5/5  Ankle plantarflexion     Ankle inversion     Ankle eversion      (Blank rows = not tested)  FUNCTIONAL TESTS:  10/05/2022:  Able to perform without UE assist  09/20/2022 Eval: Unable to sit to stand without UE support  GAIT: 09/20/2022   Eval: Distance walked: 75 feet X 2 with bilateral crutches step to pattern, crutches appear to be too short for her but cannot be lengthened any more.                     TODAY'S TREATMENT:                                                                                  DATE:  10/14/2022 Therex: Nu step L6 X 10 min UE/LE Leg press double leg 87 lbs 2X15 , single leg 2 x 15 37 lbs , performed bilateral Step up forward 6 inch step without rail assist x 15 bilat Step up lateral 6 inch step without UE support X 15 iblat Seated SLR Lt 2X15  Sit to stand to sit 8 lb weight in UE X 10 Seated knee extension machine 5# up with both and down with left only X 2X10 Gait with SPC throughout clinic    Neuro Re-ed SLS 10 sec X 3 bilat Tandem walk in bars 3 round trips, intermit UE support Stepping over 6 inch hurdle forward and back X 15 bilat without UE support  DATE:  10/12/2022 Therex: Nu step L6 X 10 min UE/LE Leg press double leg 81 lbs 2X15 , single  leg 2 x 15 37 lbs , performed bilateral Step up forward 6 inch step without rail assist x 15 Seated SLR Lt 3 x 10  Sit to stand to sit 8 lb  weight in UE 2 X 10 Seated knee extension machine 5# up with both and down with left only X 2X10 Gait with SPC throughout clinic    Neuro Re-ed SLS 10 sec X 3 bilat Tandem stance on foam in // bars with occasional HHA 1 min x 1 bilateral Stepping over 6 inch hurdle forward and back X 10 bilat without UE support  DATE:  10/07/2022 Therex: Sci fit bike UE/LE X 8 min L3 Leg press double leg 81 lbs 2X15 , single leg 2 x 15 37 lbs , performed bilateral Step up forward 6 inch step without rail assist x 15 Seated SLR Lt 3 x 10  Sit to stand to sit 8 lb weight in UE x 15 Gait with SPC throughout clinic    Neuro Re-ed SLS 10 sec X 3 bilat Tandem stance on foam in // bars with occasional HHA 1 min x 1 bilateral   PATIENT EDUCATION: 10/05/2022 Education details: HEP update Person educated: Patient Education method: Programmer, multimedia, Demonstration, Verbal cues, and Handouts Education comprehension: verbalized understanding and needs further education   HOME EXERCISE PROGRAM: Access Code: [email or web address] URL: [email or web address] Date: 10/05/2022 Prepared by: Chyrel Masson  Exercises - Supine Heel Slide with Strap  - 2 x daily - 6 x weekly - 1-2 sets - 10 reps - 5 hold - Supine Active Straight Leg Raise (Mirrored)  - 2 x daily - 6 x weekly - 1-3 sets - 10 reps - Seated Knee Extension with Resistance  - 2 x daily - 6 x weekly - 1-3 sets - 10 reps - Hip Abduction with Resistance Loop  - 1 x daily - 4-5 x weekly - 1-3 sets - 10 reps - Standing Hip Flexion with Resistance Loop  - 1 x daily - 4-5 x weekly - 1-3 sets - 10 reps - Side to Side Weight Shift with Counter Support  - 1 x daily - 4-5 x weekly - 1-3 sets - 10 reps - Seated Straight Leg Heel Taps  - 1-2 x daily - 7 x weekly - 3 sets - 10 reps - Sit to Stand  - 3 x daily - 7 x weekly - 1  sets - 10 reps  ASSESSMENT:  CLINICAL IMPRESSION: She appears to be improving with her leg strength and overall ambulation without the SPC.    OBJECTIVE IMPAIRMENTS: decreased activity tolerance, difficulty walking, decreased balance, decreased endurance, decreased mobility, decreased ROM, decreased strength, impaired flexibility, impaired UE/LE use, postural dysfunction, and pain.  ACTIVITY LIMITATIONS: bending, lifting, carry, locomotion, cleaning, community activity, driving, and or occupation  PERSONAL FACTORS: anx, htn, S/P left femur intramedullary nail on 08/10/2022  are also affecting patient's functional outcome.  REHAB POTENTIAL: Good  CLINICAL DECISION MAKING: Stable/uncomplicated  EVALUATION COMPLEXITY: Low    GOALS: Short term PT Goals Target date: 10/18/2022   Pt will be I and compliant with HEP. Baseline:  Goal status: MET 10/12/22 Pt will decrease pain by 25% overall Baseline: Goal status: MET 10/12/22  Long term PT goals Target date:11/01/2022   Pt will improve left knee AROM >130 deg to improve functional mobility Baseline: Goal status: New Pt will improve  hip/knee strength to at least 4+/5 MMT to improve functional strength Baseline: Goal status: New Pt will improve FOTO to the predicted value to show improved function Baseline: Goal status: New Pt will reduce pain to overall less than 3/10 with usual activity and work activity. Baseline: Goal status: New Pt will be able to ambulate community distances  at least 500 ft [facility] gait pattern and LRAD without complaints Baseline: Goal status: New  PLAN: PT FREQUENCY: 2-3 times per week   PT DURATION: 4-6 weeks  PLANNED INTERVENTIONS (unless contraindicated): aquatic PT, Canalith repositioning, cryotherapy, Electrical stimulation, Iontophoresis with 4 mg/ml dexamethasome, Moist heat, traction, Ultrasound, gait training, Therapeutic exercise, balance training, neuromuscular re-education, patient/family  education, prosthetic training, manual techniques, passive ROM, dry needling, taping, vasopnuematic device, vestibular, spinal manipulations, joint manipulations  PLAN FOR NEXT SESSION:  Continued progressive strengthening, dynamic balance, gait NEXT MD VISIT: 10/25/22  Ivery Quale, PT, DPT 10/14/22 2:57 PM

## 2022-10-19 ENCOUNTER — Encounter: Payer: Self-pay | Admitting: Rehabilitative and Restorative Service Providers"

## 2022-10-19 ENCOUNTER — Ambulatory Visit: Payer: 59 | Admitting: Rehabilitative and Restorative Service Providers"

## 2022-10-19 DIAGNOSIS — M79605 Pain in left leg: Secondary | ICD-10-CM

## 2022-10-19 DIAGNOSIS — R262 Difficulty in walking, not elsewhere classified: Secondary | ICD-10-CM

## 2022-10-19 DIAGNOSIS — M6281 Muscle weakness (generalized): Secondary | ICD-10-CM

## 2022-10-19 NOTE — Therapy (Signed)
OUTPATIENT PHYSICAL THERAPY TREATMENT   Patient Name: Carol Maldonado MRN: [id number] DOB:Sep 02, 1960, 62 y.o., female Today's Date: 10/19/2022  END OF SESSION:  PT End of Session - 10/19/22 1504     Visit Number 9    Number of Visits 12    Date for PT Re-Evaluation 11/01/22    Authorization Type U[employer]    Progress Note Due on Visit 10    PT Start Time 1500    PT Stop Time 1539    PT Time Calculation (min) 39 min    Activity Tolerance Patient tolerated treatment well    Behavior During Therapy [facility] for tasks assessed/performed                   Past Medical History:  Diagnosis Date   Allergy    Phreesia 02/29/2020   Anxiety    Phreesia 02/29/2020   Hyperlipidemia    Phreesia 02/29/2020   Hypertension    Phreesia 02/29/2020   Insomnia    Osteoporosis    Phreesia 02/29/2020   Seasonal allergies    Past Surgical History:  Procedure Laterality Date   COLONOSCOPY  03/27/2011   Dr. Jena Gauss; entirely normal exam and recommended repeat in 10 years.   COLONOSCOPY WITH PROPOFOL N/A 08/28/2020   Procedure: COLONOSCOPY WITH PROPOFOL;  Surgeon: Corbin Ade, MD;  [facility]: [facility];  Service: Endoscopy;  Laterality: N/A;  8:30am   FEMUR IM NAIL Left 08/10/2022   Procedure: LEFT FEMUR INTRAMEDULLARY (IM) NAIL;  Surgeon: Cammy Copa, MD;  Location: [facility] OR;  Service: Orthopedics;  Laterality: Left;   Patient Active Problem List   Diagnosis Date Noted   Closed nondisplaced subtrochanteric fracture of femur with delayed healing 08/29/2022   Thigh pain, musculoskeletal, left 06/10/2022   GAD (generalized anxiety disorder) 06/10/2022   Hyperkalemia 11/20/2021   Encounter for general adult medical examination with abnormal findings 11/09/2021   Osteoporosis 03/19/2021   GERD (gastroesophageal reflux disease) 03/19/2021   HLD (hyperlipidemia) 03/19/2021   MDD (major depressive disorder), recurrent, in partial remission (HCC) 03/19/2021   Anemia 11/12/2020   Stage  3b chronic kidney disease (HCC) 08/05/2020   Colon cancer screening 06/23/2020   Insomnia, unspecified 03/03/2020   Essential hypertension, benign 03/03/2020   Seasonal allergic rhinitis 03/03/2020    PCP: Anabel Halon, MD   REFERRING PROVIDER: Cammy Copa, MD   REFERRING DIAG: [phone] (ICD-10-CM) - Stress fracture of femoral shaft, left, sequela   THERAPY DIAG:  Pain in left leg  Difficulty in walking, not elsewhere classified  Muscle weakness (generalized)  Rationale for Evaluation and Treatment: Rehabilitation  ONSET DATE: left femur intramedullary nail on 08/10/2022   SUBJECTIVE:   SUBJECTIVE STATEMENT: Pt indicated soreness in anterior and lateral Lt thigh at times.  Reported lying on Lt side is still hard.  Reported feeling some improvements with walking but not feeling fully normally.   PERTINENT HISTORY: s/p left femur fracture, no restrictions, WBAT, osteoporosis  PAIN:  NPRS scale: 5/10 Pain location: Rt thigh Pain description: ache, throb, pull Aggravating factors: getting in/out of car, putting weight on her left leg Relieving factors: over the counter NSAIDs, sitting with leg reclined  PRECAUTIONS: None  WEIGHT BEARING RESTRICTIONS: No  FALLS:  Has patient fallen in last 6 months? 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  Intramedullary nail in good  alignment without any complicating features.  No change in position  compared with intraoperative radiographs.   PATIENT SURVEYS:  09/20/2022 Eval: FOTO functional intake 3%, goal is 47%  COGNITION: 09/20/2022 Overall cognitive status: Within functional limits for tasks  assessed     SENSATION: 09/20/2022 [facility]  MUSCLE LENGTH: 09/20/2022 Eval: Maisie Fus test: Left very tight   PALPATION: 09/20/2022 Eval: TTP in left quads  LOWER EXTREMITY ROM:  Active ROM Right 09/20/2022 Left 09/20/2022   Hip flexion     Hip extension     Hip abduction     Hip adduction     Hip internal rotation     Hip external rotation     Knee flexion 135 125   Knee extension  0   Ankle dorsiflexion     Ankle plantarflexion     Ankle inversion     Ankle eversion      (Blank rows = not tested)  LOWER EXTREMITY MMT:  MMT Right 09/20/2022 Left 09/20/2022 Left 09/29/2022  Hip flexion  3 4/5  Hip extension     Hip abduction  3+   Hip adduction     Hip internal rotation     Hip external rotation     Knee flexion 5 3+ 5/5  Knee extension 5 3+ 4/5  Ankle dorsiflexion   5/5  Ankle plantarflexion     Ankle inversion     Ankle eversion      (Blank rows = not tested)  FUNCTIONAL TESTS:  10/05/2022:  Able to perform without UE assist  09/20/2022 Eval: Unable to sit to stand without UE support  GAIT: 09/20/2022   Eval: Distance walked: 75 feet X 2 with bilateral crutches step to pattern, crutches appear to be too short for her but cannot be lengthened any more.                     TODAY'S TREATMENT:                                                                                DATE:  10/19/2022 Therex: Nu step L6 X 10 min UE/LE Supine figure 4 push away stretch Lt 15 sec x 5 Supine figure 4 pull towards stretch Lt 15 sec x 5 Supine bridge 2-3 sec hold 2 x 10 Leg press double leg 87 lbs 2 X15 , single leg 2 x 15 37 lbs , performed bilateral Seated knee extension machine double leg up single leg lowering 3 x 10 5 lbs, performed bilaterally      TODAY'S TREATMENT:  DATE:  10/14/2022 Therex: Nu step L6 X 10 min UE/LE Leg press double leg 87 lbs 2X15 , single leg 2 x 15 37 lbs ,  performed bilateral Step up forward 6 inch step without rail assist x 15 bilat Step up lateral 6 inch step without UE support X 15 iblat Seated SLR Lt 2X15  Sit to stand to sit 8 lb weight in UE X 10 Seated knee extension machine 5# up with both and down with left only X 2X10 Gait with SPC throughout clinic    Neuro Re-ed SLS 10 sec X 3 bilat Tandem walk in bars 3 round trips, intermit UE support Stepping over 6 inch hurdle forward and back X 15 bilat without UE support  TODAY'S TREATMENT:                                                                                DATE:  10/12/2022 Therex: Nu step L6 X 10 min UE/LE Leg press double leg 81 lbs 2X15 , single leg 2 x 15 37 lbs , performed bilateral Step up forward 6 inch step without rail assist x 15 Seated SLR Lt 3 x 10  Sit to stand to sit 8 lb weight in UE 2 X 10 Seated knee extension machine 5# up with both and down with left only X 2X10 Gait with SPC throughout clinic    Neuro Re-ed SLS 10 sec X 3 bilat Tandem stance on foam in // bars with occasional HHA 1 min x 1 bilateral Stepping over 6 inch hurdle forward and back X 10 bilat without UE support  TODAY'S TREATMENT:                                                                                DATE:  10/07/2022 Therex: Sci fit bike UE/LE X 8 min L3 Leg press double leg 81 lbs 2X15 , single leg 2 x 15 37 lbs , performed bilateral Step up forward 6 inch step without rail assist x 15 Seated SLR Lt 3 x 10  Sit to stand to sit 8 lb weight in UE x 15 Gait with SPC throughout clinic    Neuro Re-ed SLS 10 sec X 3 bilat Tandem stance on foam in // bars with occasional HHA 1 min x 1 bilateral   PATIENT EDUCATION: 10/19/2022 Education details: HEP update Person educated: Patient Education method: Programmer, multimedia, Demonstration, Verbal cues, and Handouts Education comprehension: verbalized understanding and needs further education   HOME EXERCISE PROGRAM: Access Code:  [email or web address] URL: [email or web address] Date: 10/19/2022 Prepared by: Chyrel Masson  Exercises - Seated Knee Extension with Resistance  - 2 x daily - 6 x weekly - 1-3 sets - 10 reps - Hip Abduction with Resistance Loop  - 1 x daily - 4-5 x weekly - 1-3 sets - 10 reps - Standing Hip Flexion with Resistance Loop  - 1  x daily - 4-5 x weekly - 1-3 sets - 10 reps - Side to Side Weight Shift with Counter Support  - 1 x daily - 4-5 x weekly - 1-3 sets - 10 reps - Seated Straight Leg Heel Taps  - 1-2 x daily - 7 x weekly - 3 sets - 10 reps - Sit to Stand  - 3 x daily - 7 x weekly - 1 sets - 10 reps - Supine Figure 4 Piriformis Stretch  - 2 x daily - 7 x weekly - 1 sets - 5 reps - 15 hold - Supine Piriformis Stretch with Foot on Ground  - 2 x daily - 7 x weekly - 1 sets - 5 reps - 15 hold  ASSESSMENT:  CLINICAL IMPRESSION:  Impairment with tightness/restriction noted in Lt hip mobility in FABER and FADER positioning.  Added stretching to tolerance as option for improving mobility and symptoms in this area today.  Continued strengthening to benefit Pt functional activity tolerance.  10th visit and last visit prior to MD visit is the next skilled PT visit scheduled.  Plan to recheck and reassess measurements.    OBJECTIVE IMPAIRMENTS: decreased activity tolerance, difficulty walking, decreased balance, decreased endurance, decreased mobility, decreased ROM, decreased strength, impaired flexibility, impaired UE/LE use, postural dysfunction, and pain.  ACTIVITY LIMITATIONS: bending, lifting, carry, locomotion, cleaning, community activity, driving, and or occupation  PERSONAL FACTORS: anx, htn, S/P left femur intramedullary nail on 08/10/2022  are also affecting patient's functional outcome.  REHAB POTENTIAL: Good  CLINICAL DECISION MAKING: Stable/uncomplicated  EVALUATION COMPLEXITY: Low    GOALS: Short term PT Goals Target date: 10/18/2022   Pt will be I and compliant with  HEP. Baseline:  Goal status: MET 10/12/22 Pt will decrease pain by 25% overall Baseline: Goal status: MET 10/12/22  Long term PT goals Target date:11/01/2022   Pt will improve left knee AROM >130 deg to improve functional mobility Baseline: Goal status:  Pt will improve  hip/knee strength to at least 4+/5 MMT to improve functional strength Baseline: Goal status: New Pt will improve FOTO to the predicted value to show improved function Baseline: Goal status: New Pt will reduce pain to overall less than 3/10 with usual activity and work activity. Baseline: Goal status: New Pt will be able to ambulate community distances at least 500 ft WFL gait pattern and LRAD without complaints Baseline: Goal status: New  PLAN: PT FREQUENCY: 2-3 times per week   PT DURATION: 4-6 weeks  PLANNED INTERVENTIONS (unless contraindicated): aquatic PT, Canalith repositioning, cryotherapy, Electrical stimulation, Iontophoresis with 4 mg/ml dexamethasome, Moist heat, traction, Ultrasound, gait training, Therapeutic exercise, balance training, neuromuscular re-education, patient/family education, prosthetic training, manual techniques, passive ROM, dry needling, taping, vasopnuematic device, vestibular, spinal manipulations, joint manipulations  PLAN FOR NEXT SESSION:  10th visit progress note with FOTO update with LTG assessment,  ROM/strength   MD note routing.   Recert for additional visit number/adjusted POC needed (she scheduled 3-4 more weeks which is more than the 12 visits listed)     NEXT MD VISIT: 10/25/22  Chyrel Masson, PT, DPT, OCS, ATC 10/19/22  3:42 PM

## 2022-10-20 ENCOUNTER — Ambulatory Visit (HOSPITAL_COMMUNITY): Payer: 59 | Admitting: Physical Therapy

## 2022-10-21 ENCOUNTER — Encounter: Payer: Self-pay | Admitting: Physical Therapy

## 2022-10-21 ENCOUNTER — Ambulatory Visit: Payer: 59 | Admitting: Physical Therapy

## 2022-10-21 DIAGNOSIS — M79605 Pain in left leg: Secondary | ICD-10-CM | POA: Diagnosis not present

## 2022-10-21 DIAGNOSIS — M6281 Muscle weakness (generalized): Secondary | ICD-10-CM | POA: Diagnosis not present

## 2022-10-21 DIAGNOSIS — R262 Difficulty in walking, not elsewhere classified: Secondary | ICD-10-CM

## 2022-10-21 NOTE — Therapy (Signed)
OUTPATIENT PHYSICAL THERAPY TREATMENT/Progress note, recert Progress Note reporting period 09/20/22 to 10/21/22  See below for objective and subjective measurements relating to patients progress with PT.    Patient Name: Carol Maldonado MRN: [id number] DOB:05-06-1960, 62 y.o., female Today's Date: 10/21/2022  END OF SESSION:  PT End of Session - 10/21/22 1537     Visit Number 10    Number of Visits 22    Date for PT Re-Evaluation 11/25/22    Authorization Type UHC    Progress Note Due on Visit 20    PT Start Time 1505    PT Stop Time 1545    PT Time Calculation (min) 40 min    Activity Tolerance Patient tolerated treatment well    Behavior During Therapy [facility] for tasks assessed/performed                   Past Medical History:  Diagnosis Date   Allergy    Phreesia 02/29/2020   Anxiety    Phreesia 02/29/2020   Hyperlipidemia    Phreesia 02/29/2020   Hypertension    Phreesia 02/29/2020   Insomnia    Osteoporosis    Phreesia 02/29/2020   Seasonal allergies    Past Surgical History:  Procedure Laterality Date   COLONOSCOPY  03/27/2011   Dr. Jena Gauss; entirely normal exam and recommended repeat in 10 years.   COLONOSCOPY WITH PROPOFOL N/A 08/28/2020   Procedure: COLONOSCOPY WITH PROPOFOL;  Surgeon: Corbin Ade, MD;  [facility];  Service: Endoscopy;  Laterality: N/A;  8:30am   FEMUR IM NAIL Left 08/10/2022   Procedure: LEFT FEMUR INTRAMEDULLARY (IM) NAIL;  Surgeon: Cammy Copa, MD;  Location: [facility] OR;  Service: Orthopedics;  Laterality: Left;   Patient Active Problem List   Diagnosis Date Noted   Closed nondisplaced subtrochanteric fracture of femur with delayed healing 08/29/2022   Thigh pain, musculoskeletal, left 06/10/2022   GAD (generalized anxiety disorder) 06/10/2022   Hyperkalemia 11/20/2021   Encounter for general adult medical examination with abnormal findings 11/09/2021   Osteoporosis 03/19/2021   GERD (gastroesophageal reflux  disease) 03/19/2021   HLD (hyperlipidemia) 03/19/2021   MDD (major depressive disorder), recurrent, in partial remission (HCC) 03/19/2021   Anemia 11/12/2020   Stage 3b chronic kidney disease (HCC) 08/05/2020   Colon cancer screening 06/23/2020   Insomnia, unspecified 03/03/2020   Essential hypertension, benign 03/03/2020   Seasonal allergic rhinitis 03/03/2020    PCP: Anabel Halon, MD   REFERRING PROVIDER: Cammy Copa, MD   REFERRING DIAG: [phone] (ICD-10-CM) - Stress fracture of femoral shaft, left, sequela   THERAPY DIAG:  Pain in left leg  Difficulty in walking, not elsewhere classified  Muscle weakness (generalized)  Rationale for Evaluation and Treatment: Rehabilitation  ONSET DATE: left femur intramedullary nail on 08/10/2022   SUBJECTIVE:   SUBJECTIVE STATEMENT: Pt indicated some soreness, the pain is overall better. She feels she has improved but will continue to need PT.  PERTINENT HISTORY: s/p left femur fracture, no restrictions, WBAT, osteoporosis  PAIN:  NPRS scale: 3/10 Pain location: Rt thigh Pain description: ache, throb, pull Aggravating factors: getting in/out of car, putting weight on her left leg Relieving factors: over the counter NSAIDs, sitting with leg reclined  PRECAUTIONS: None  WEIGHT BEARING RESTRICTIONS: No  FALLS:  Has patient fallen in last 6 months? 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  Intramedullary nail in good  alignment without any complicating features.  No change in position  compared with intraoperative radiographs.   PATIENT SURVEYS:  09/20/2022 Eval: FOTO functional intake 3%, goal is  47% 10/21/22: FOTO improved to 47%  COGNITION: 09/20/2022 Overall cognitive status: Within functional limits for tasks assessed     SENSATION: 09/20/2022 [facility]  MUSCLE LENGTH: 09/20/2022 Eval: Maisie Fus test: Left very tight   PALPATION: 09/20/2022 Eval: TTP in left quads  LOWER EXTREMITY ROM:  Active ROM Right 09/20/2022 Left 09/20/2022 Left 10/21/22  Hip flexion     Hip extension     Hip abduction     Hip adduction     Hip internal rotation     Hip external rotation     Knee flexion 135 125 129  Knee extension  0   Ankle dorsiflexion     Ankle plantarflexion     Ankle inversion     Ankle eversion      (Blank rows = not tested)  LOWER EXTREMITY MMT:  MMT Right 09/20/2022 Left 09/20/2022 Left 09/29/2022 Left 10/21/22  Hip flexion  3 4/5 4/5  Hip extension      Hip abduction  3+    Hip adduction      Hip internal rotation      Hip external rotation      Knee flexion 5 3+ 5/5 5/5  Knee extension 5 3+ 4/5 4+/5  Ankle dorsiflexion   5/5   Ankle plantarflexion      Ankle inversion      Ankle eversion       (Blank rows = not tested)  FUNCTIONAL TESTS:  10/05/2022:  Able to perform without UE assist  09/20/2022 Eval: Unable to sit to stand without UE support  GAIT: 09/20/2022   Eval: Distance walked: 75 feet X 2 with bilateral crutches step to pattern, crutches appear to be too short for her but cannot be lengthened any more.                     TODAY'S TREATMENT:                                                                                DATE:  10/19/2022 Therex: Nu step L6 X 10 min UE/LE Supine figure 4 push away stretch Lt 30 sec X 2 Supine figure 4 pull towards stretch Lt 30 sec X 2 Supine bridge 2-3 sec hold X 15 Leg press double leg 87 lbs 2 X15 , single leg 2 x 15 37 lbs , performed bilateral Seated knee extension machine double leg up single leg lowering 3 x 10 5 lbs, performed bilaterally  Seated knee flexion machine 25# DL 1O10 Step ups and  over 6 inch step X 10 bilat forward and X 10 bilat lateral, no UE support     TODAY'S TREATMENT:  DATE:  10/14/2022 Therex: Nu step L6 X 10 min UE/LE Leg press double leg 87 lbs 2X15 , single leg 2 x 15 37 lbs , performed bilateral Step up forward 6 inch step without rail assist x 15 bilat Step up lateral 6 inch step without UE support X 15 iblat Seated SLR Lt 2X15  Sit to stand to sit 8 lb weight in UE X 10 Seated knee extension machine 5# up with both and down with left only X 2X10 Gait with SPC throughout clinic    Neuro Re-ed SLS 10 sec X 3 bilat Tandem walk in bars 3 round trips, intermit UE support Stepping over 6 inch hurdle forward and back X 15 bilat without UE support  TODAY'S TREATMENT:                                                                                DATE:  10/12/2022 Therex: Nu step L6 X 10 min UE/LE Leg press double leg 81 lbs 2X15 , single leg 2 x 15 37 lbs , performed bilateral Step up forward 6 inch step without rail assist x 15 Seated SLR Lt 3 x 10  Sit to stand to sit 8 lb weight in UE 2 X 10 Seated knee extension machine 5# up with both and down with left only X 2X10 Gait with SPC throughout clinic    Neuro Re-ed SLS 10 sec X 3 bilat Tandem stance on foam in // bars with occasional HHA 1 min x 1 bilateral Stepping over 6 inch hurdle forward and back X 10 bilat without UE support  TODAY'S TREATMENT:                                                                                DATE:  10/07/2022 Therex: Sci fit bike UE/LE X 8 min L3 Leg press double leg 81 lbs 2X15 , single leg 2 x 15 37 lbs , performed bilateral Step up forward 6 inch step without rail assist x 15 Seated SLR Lt 3 x 10  Sit to stand to sit 8 lb weight in UE x 15 Gait with SPC throughout clinic    Neuro Re-ed SLS 10 sec X 3 bilat Tandem stance on foam in // bars with occasional HHA 1 min x 1  bilateral   PATIENT EDUCATION: 10/19/2022 Education details: HEP update Person educated: Patient Education method: Programmer, multimedia, Demonstration, Verbal cues, and Handouts Education comprehension: verbalized understanding and needs further education   HOME EXERCISE PROGRAM: Access Code: [email or web address] URL: [email or web address] Date: 10/19/2022 Prepared by: Chyrel Masson  Exercises - Seated Knee Extension with Resistance  - 2 x daily - 6 x weekly - 1-3 sets - 10 reps - Hip Abduction with Resistance Loop  - 1 x daily - 4-5 x weekly - 1-3 sets - 10 reps - Standing Hip Flexion with Resistance Loop  - 1 x  daily - 4-5 x weekly - 1-3 sets - 10 reps - Side to Side Weight Shift with Counter Support  - 1 x daily - 4-5 x weekly - 1-3 sets - 10 reps - Seated Straight Leg Heel Taps  - 1-2 x daily - 7 x weekly - 3 sets - 10 reps - Sit to Stand  - 3 x daily - 7 x weekly - 1 sets - 10 reps - Supine Figure 4 Piriformis Stretch  - 2 x daily - 7 x weekly - 1 sets - 5 reps - 15 hold - Supine Piriformis Stretch with Foot on Ground  - 2 x daily - 7 x weekly - 1 sets - 5 reps - 15 hold  ASSESSMENT:  CLINICAL IMPRESSION:  10th visit progress note shows she is making good overall progress with PT but has not yet met her PT goals so PT is recommending up to 5 more weeks of PT recert to continue to work toward improving her functional deficits.    OBJECTIVE IMPAIRMENTS: decreased activity tolerance, difficulty walking, decreased balance, decreased endurance, decreased mobility, decreased ROM, decreased strength, impaired flexibility, impaired UE/LE use, postural dysfunction, and pain.  ACTIVITY LIMITATIONS: bending, lifting, carry, locomotion, cleaning, community activity, driving, and or occupation  PERSONAL FACTORS: anx, htn, S/P left femur intramedullary nail on 08/10/2022  are also affecting patient's functional outcome.  REHAB POTENTIAL: Good  CLINICAL DECISION MAKING:  Stable/uncomplicated  EVALUATION COMPLEXITY: Low    GOALS: Short term PT Goals Target date: 10/18/2022   Pt will be I and compliant with HEP. Baseline:  Goal status: MET 10/12/22 Pt will decrease pain by 25% overall Baseline: Goal status: MET 10/12/22  Long term PT goals Target date:11/01/2022   Pt will improve left knee AROM >130 deg to improve functional mobility Baseline: Goal status: ongoing, has improved to 129 Pt will improve  hip/knee strength to at least 4+/5 MMT to improve functional strength Baseline: Goal status: ongoing 10/21/22 Pt will improve FOTO to the predicted value to show improved function Baseline: Goal status:MET 10/21/22 Pt will reduce pain to overall less than 3/10 with usual activity and work activity. Baseline: Goal status: ongoing 10/21/22 Pt will be able to ambulate community distances at least 500 ft WFL gait pattern and LRAD without complaints Baseline: Goal status: ongoing 10/21/22  PLAN: PT FREQUENCY: 2-3 times per week   PT DURATION: 4-6 weeks  PLANNED INTERVENTIONS (unless contraindicated): aquatic PT, Canalith repositioning, cryotherapy, Electrical stimulation, Iontophoresis with 4 mg/ml dexamethasome, Moist heat, traction, Ultrasound, gait training, Therapeutic exercise, balance training, neuromuscular re-education, patient/family education, prosthetic training, manual techniques, passive ROM, dry needling, taping, vasopnuematic device, vestibular, spinal manipulations, joint manipulations  PLAN FOR NEXT SESSION:  What did MD say? Continue to work on left leg strength focus     NEXT MD VISIT: 10/25/22  Ivery Quale, PT, DPT 10/21/22 3:56 PM

## 2022-10-25 ENCOUNTER — Ambulatory Visit (INDEPENDENT_AMBULATORY_CARE_PROVIDER_SITE_OTHER): Payer: 59 | Admitting: Surgical

## 2022-10-25 ENCOUNTER — Other Ambulatory Visit: Payer: Self-pay | Admitting: Internal Medicine

## 2022-10-25 ENCOUNTER — Other Ambulatory Visit (INDEPENDENT_AMBULATORY_CARE_PROVIDER_SITE_OTHER): Payer: 59

## 2022-10-25 DIAGNOSIS — G4700 Insomnia, unspecified: Secondary | ICD-10-CM

## 2022-10-25 DIAGNOSIS — E782 Mixed hyperlipidemia: Secondary | ICD-10-CM

## 2022-10-25 DIAGNOSIS — M84352S Stress fracture, left femur, sequela: Secondary | ICD-10-CM

## 2022-10-25 DIAGNOSIS — J302 Other seasonal allergic rhinitis: Secondary | ICD-10-CM

## 2022-10-25 DIAGNOSIS — K219 Gastro-esophageal reflux disease without esophagitis: Secondary | ICD-10-CM

## 2022-10-26 ENCOUNTER — Telehealth: Payer: Self-pay | Admitting: Surgical

## 2022-10-26 ENCOUNTER — Encounter: Payer: Self-pay | Admitting: Rehabilitative and Restorative Service Providers"

## 2022-10-26 ENCOUNTER — Ambulatory Visit: Payer: 59 | Admitting: Rehabilitative and Restorative Service Providers"

## 2022-10-26 DIAGNOSIS — M6281 Muscle weakness (generalized): Secondary | ICD-10-CM | POA: Diagnosis not present

## 2022-10-26 DIAGNOSIS — M79605 Pain in left leg: Secondary | ICD-10-CM | POA: Diagnosis not present

## 2022-10-26 DIAGNOSIS — R262 Difficulty in walking, not elsewhere classified: Secondary | ICD-10-CM

## 2022-10-26 MED ORDER — DESLORATADINE 5 MG PO TABS
5.0000 mg | ORAL_TABLET | Freq: Every day | ORAL | 0 refills | Status: DC
Start: 2022-10-26 — End: 2022-11-24

## 2022-10-26 MED ORDER — SERTRALINE HCL 50 MG PO TABS
50.0000 mg | ORAL_TABLET | Freq: Every day | ORAL | 0 refills | Status: DC
Start: 2022-10-26 — End: 2022-11-24

## 2022-10-26 MED ORDER — FLUNISOLIDE 25 MCG/ACT (0.025%) NA SOLN: 2.0000 | Freq: Two times a day (BID) | NASAL | 0 refills | Status: DC

## 2022-10-26 MED ORDER — AMLODIPINE-OLMESARTAN 10-20 MG PO TABS: 1.0000 | ORAL_TABLET | Freq: Every day | ORAL | 0 refills | Status: DC

## 2022-10-26 MED ORDER — ALENDRONATE SODIUM 70 MG PO TABS: 70.0000 mg | ORAL_TABLET | ORAL | 0 refills | Status: DC

## 2022-10-26 MED ORDER — RABEPRAZOLE SODIUM 20 MG PO TBEC
20.0000 mg | DELAYED_RELEASE_TABLET | Freq: Every day | ORAL | 0 refills | Status: DC
Start: 2022-10-26 — End: 2022-11-24

## 2022-10-26 MED ORDER — ROSUVASTATIN CALCIUM 20 MG PO TABS
20.0000 mg | ORAL_TABLET | Freq: Every day | ORAL | 0 refills | Status: DC
Start: 2022-10-26 — End: 2022-11-24

## 2022-10-26 NOTE — Therapy (Signed)
OUTPATIENT PHYSICAL THERAPY TREATMENT  Patient Name: Carol Maldonado MRN: [id number] DOB:12/14/1960, 62 y.o., female Today's Date: 10/26/2022  END OF SESSION:  PT End of Session - 10/26/22 0957     Visit Number 11    Number of Visits 22    Date for PT Re-Evaluation 11/25/22    Authorization Type U[employer]    Progress Note Due on Visit 20    PT Start Time 1011    PT Stop Time 1050    PT Time Calculation (min) 39 min    Activity Tolerance Patient tolerated treatment well    Behavior During Therapy [facility] for tasks assessed/performed                    Past Medical History:  Diagnosis Date   Allergy    Phreesia 02/29/2020   Anxiety    Phreesia 02/29/2020   Hyperlipidemia    Phreesia 02/29/2020   Hypertension    Phreesia 02/29/2020   Insomnia    Osteoporosis    Phreesia 02/29/2020   Seasonal allergies    Past Surgical History:  Procedure Laterality Date   COLONOSCOPY  03/27/2011   Dr. Jena Gauss; entirely normal exam and recommended repeat in 10 years.   COLONOSCOPY WITH PROPOFOL N/A 08/28/2020   Procedure: COLONOSCOPY WITH PROPOFOL;  Surgeon: Corbin Ade, MD;  [facility];  Service: Endoscopy;  Laterality: N/A;  8:30am   FEMUR IM NAIL Left 08/10/2022   Procedure: LEFT FEMUR INTRAMEDULLARY (IM) NAIL;  Surgeon: Cammy Copa, MD;  Location: [facility] OR;  Service: Orthopedics;  Laterality: Left;   Patient Active Problem List   Diagnosis Date Noted   Closed nondisplaced subtrochanteric fracture of femur with delayed healing 08/29/2022   Thigh pain, musculoskeletal, left 06/10/2022   GAD (generalized anxiety disorder) 06/10/2022   Hyperkalemia 11/20/2021   Encounter for general adult medical examination with abnormal findings 11/09/2021   Osteoporosis 03/19/2021   GERD (gastroesophageal reflux disease) 03/19/2021   HLD (hyperlipidemia) 03/19/2021   MDD (major depressive disorder), recurrent, in partial remission (HCC) 03/19/2021   Anemia 11/12/2020   Stage  3b chronic kidney disease (HCC) 08/05/2020   Colon cancer screening 06/23/2020   Insomnia, unspecified 03/03/2020   Essential hypertension, benign 03/03/2020   Seasonal allergic rhinitis 03/03/2020    PCP: Anabel Halon, MD   REFERRING PROVIDER: Cammy Copa, MD   REFERRING DIAG: [phone] (ICD-10-CM) - Stress fracture of femoral shaft, left, sequela   THERAPY DIAG:  Pain in left leg  Difficulty in walking, not elsewhere classified  Muscle weakness (generalized)  Rationale for Evaluation and Treatment: Rehabilitation  ONSET DATE: left femur intramedullary nail on 08/10/2022   SUBJECTIVE:   SUBJECTIVE STATEMENT: She indicated overall similar complaints with general soreness.  Saw MD and will return back in Sept.  No specific pain number reported today upon arrival.   PERTINENT HISTORY: s/p left femur fracture, no restrictions, WBAT, osteoporosis  PAIN:  NPRS scale: no specific number today.  Pain location: Rt thigh Pain description: ache, throb, pull Aggravating factors: getting in/out of car, putting weight on her left leg Relieving factors: over the counter NSAIDs, sitting with leg reclined  PRECAUTIONS: None  WEIGHT BEARING RESTRICTIONS: No  FALLS:  Has patient fallen in last 6 months? 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  Intramedullary nail in good  alignment without any complicating features.  No change in position  compared with intraoperative radiographs.   PATIENT SURVEYS:  09/20/2022 Eval: FOTO functional intake 3%, goal is 47% 10/21/22: FOTO improved to 47%  COGNITION: 09/20/2022 Overall cognitive status: Within functional  limits for tasks assessed     SENSATION: 09/20/2022 [facility]  MUSCLE LENGTH: 09/20/2022 Eval: Maisie Fus test: Left very tight   PALPATION: 09/20/2022 Eval: TTP in left quads  LOWER EXTREMITY ROM:  Active ROM Right 09/20/2022 Left 09/20/2022 Left 10/21/22  Hip flexion     Hip extension     Hip abduction     Hip adduction     Hip internal rotation     Hip external rotation     Knee flexion 135 125 129  Knee extension  0   Ankle dorsiflexion     Ankle plantarflexion     Ankle inversion     Ankle eversion      (Blank rows = not tested)  LOWER EXTREMITY MMT:  MMT Right 09/20/2022 Left 09/20/2022 Left 09/29/2022 Left 10/21/22  Hip flexion  3 4/5 4/5  Hip extension      Hip abduction  3+    Hip adduction      Hip internal rotation      Hip external rotation      Knee flexion 5 3+ 5/5 5/5  Knee extension 5 3+ 4/5 4+/5  Ankle dorsiflexion   5/5   Ankle plantarflexion      Ankle inversion      Ankle eversion       (Blank rows = not tested)  FUNCTIONAL TESTS:  10/05/2022:  Able to perform without UE assist  09/20/2022 Eval: Unable to sit to stand without UE support  GAIT: 09/20/2022   Eval: Distance walked: 75 feet X 2 with bilateral crutches step to pattern, crutches appear to be too short for her but cannot be lengthened any more.                     TODAY'S TREATMENT:                                                                                DATE:  10/26/2022 Therex: Nu step L6 X 10 min UE/LE Step up forward 6 inch step x 15 bilateral no UE assist Leg press double leg 93 lbs 2 X15 , single leg 2 x 15 37 lbs , performed bilateral Seated knee extension machine double leg up single leg lowering 2 x 10 10 lbs, performed bilaterally  Squat to tap 18 inch chair with slow lowering focus x 10   Neuro Re-ed SLS on foam mat with contralateral leg corner touching x 8 each, bilaterally with occasional HHA on bars    TODAY'S TREATMENT:  DATE:  10/19/2022 Therex: Nu step L6 X 10 min UE/LE Supine figure 4 push away stretch Lt 30 sec X 2 Supine figure 4 pull towards stretch Lt 30 sec X 2 Supine bridge 2-3 sec hold X 15 Leg press double leg 87 lbs 2 X15 , single leg 2 x 15 37 lbs , performed bilateral Seated knee extension machine double leg up single leg lowering 3 x 10 5 lbs, performed bilaterally  Seated knee flexion machine 25# [id number] Step ups and over 6 inch step X 10 bilat forward and X 10 bilat lateral, no UE support     TODAY'S TREATMENT:                                                                                DATE:  10/14/2022 Therex: Nu step L6 X 10 min UE/LE Leg press double leg 87 lbs 2X15 , single leg 2 x 15 37 lbs , performed bilateral Step up forward 6 inch step without rail assist x 15 bilat Step up lateral 6 inch step without UE support X 15 iblat Seated SLR Lt 2X15  Sit to stand to sit 8 lb weight in UE X 10 Seated knee extension machine 5# up with both and down with left only X 2X10 Gait with SPC throughout clinic    Neuro Re-ed SLS 10 sec X 3 bilat Tandem walk in bars 3 round trips, intermit UE support Stepping over 6 inch hurdle forward and back X 15 bilat without UE support  TODAY'S TREATMENT:                                                                                DATE:  10/12/2022 Therex: Nu step L6 X 10 min UE/LE Leg press double leg 81 lbs 2X15 , single leg 2 x 15 37 lbs , performed bilateral Step up forward 6 inch step without rail assist x 15 Seated SLR Lt 3 x 10  Sit to stand to sit 8 lb weight in UE 2 X 10 Seated knee extension machine 5# up with both and down with left only X 2X10 Gait with SPC throughout clinic    Neuro Re-ed SLS 10 sec X 3 bilat Tandem stance on foam in // bars with occasional HHA 1 min x 1 bilateral Stepping over 6 inch hurdle forward and back X 10 bilat without UE support   PATIENT  EDUCATION: 10/19/2022 Education details: HEP update Person educated: Patient Education method: Programmer, multimedia, Demonstration, Verbal cues, and Handouts Education comprehension: verbalized understanding and needs further education   HOME EXERCISE PROGRAM: Access Code: [email or web address] URL: [email or web address] Date: 10/19/2022 Prepared by: Chyrel Masson  Exercises - Seated Knee Extension with Resistance  - 2 x daily - 6 x weekly - 1-3 sets - 10 reps - Hip Abduction with Resistance Loop  - 1 x daily - 4-5 x weekly - 1-3  sets - 10 reps - Standing Hip Flexion with Resistance Loop  - 1 x daily - 4-5 x weekly - 1-3 sets - 10 reps - Side to Side Weight Shift with Counter Support  - 1 x daily - 4-5 x weekly - 1-3 sets - 10 reps - Seated Straight Leg Heel Taps  - 1-2 x daily - 7 x weekly - 3 sets - 10 reps - Sit to Stand  - 3 x daily - 7 x weekly - 1 sets - 10 reps - Supine Figure 4 Piriformis Stretch  - 2 x daily - 7 x weekly - 1 sets - 5 reps - 15 hold - Supine Piriformis Stretch with Foot on Ground  - 2 x daily - 7 x weekly - 1 sets - 5 reps - 15 hold  ASSESSMENT:  CLINICAL IMPRESSION:  Pt continued to show steady progress in progressive resistance strengthening program.  Single leg stance control on compliant surface was fair at best today. Continued improvement to help improve stability with ambulation.    OBJECTIVE IMPAIRMENTS: decreased activity tolerance, difficulty walking, decreased balance, decreased endurance, decreased mobility, decreased ROM, decreased strength, impaired flexibility, impaired UE/LE use, postural dysfunction, and pain.  ACTIVITY LIMITATIONS: bending, lifting, carry, locomotion, cleaning, community activity, driving, and or occupation  PERSONAL FACTORS: anx, htn, S/P left femur intramedullary nail on 08/10/2022  are also affecting patient's functional outcome.  REHAB POTENTIAL: Good  CLINICAL DECISION MAKING: Stable/uncomplicated  EVALUATION COMPLEXITY:  Low    GOALS: Short term PT Goals Target date: 10/18/2022   Pt will be I and compliant with HEP. Baseline:  Goal status: MET 10/12/22 Pt will decrease pain by 25% overall Baseline: Goal status: MET 10/12/22  Long term PT goals Target date:11/01/2022   Pt will improve left knee AROM >130 deg to improve functional mobility Baseline: Goal status: ongoing, has improved to 129 Pt will improve  hip/knee strength to at least 4+/5 MMT to improve functional strength Baseline: Goal status: ongoing 10/21/22 Pt will improve FOTO to the predicted value to show improved function Baseline: Goal status:MET 10/21/22 Pt will reduce pain to overall less than 3/10 with usual activity and work activity. Baseline: Goal status: ongoing 10/21/22 Pt will be able to ambulate community distances at least 500 ft [facility] gait pattern and LRAD without complaints Baseline: Goal status: ongoing 10/21/22  PLAN: PT FREQUENCY: 2-3 times per week   PT DURATION: 4-6 weeks  PLANNED INTERVENTIONS (unless contraindicated): aquatic PT, Canalith repositioning, cryotherapy, Electrical stimulation, Iontophoresis with 4 mg/ml dexamethasome, Moist heat, traction, Ultrasound, gait training, Therapeutic exercise, balance training, neuromuscular re-education, patient/family education, prosthetic training, manual techniques, passive ROM, dry needling, taping, vasopnuematic device, vestibular, spinal manipulations, joint manipulations  PLAN FOR NEXT SESSION:  Strengthening, dynamic and compliant surface balance.    Chyrel Masson, PT, DPT, OCS, ATC 10/26/22  10:46 AM

## 2022-10-26 NOTE — Telephone Encounter (Signed)
Patient seen 10/25/22, please advise if patient is still oow. Form received. Thank you!

## 2022-10-27 ENCOUNTER — Encounter: Payer: Self-pay | Admitting: Rehabilitative and Restorative Service Providers"

## 2022-10-27 ENCOUNTER — Ambulatory Visit: Payer: 59 | Admitting: Rehabilitative and Restorative Service Providers"

## 2022-10-27 DIAGNOSIS — M79605 Pain in left leg: Secondary | ICD-10-CM | POA: Diagnosis not present

## 2022-10-27 DIAGNOSIS — R262 Difficulty in walking, not elsewhere classified: Secondary | ICD-10-CM

## 2022-10-27 DIAGNOSIS — M6281 Muscle weakness (generalized): Secondary | ICD-10-CM

## 2022-10-27 NOTE — Therapy (Signed)
OUTPATIENT PHYSICAL THERAPY TREATMENT  Patient Name: Carol Maldonado MRN: [id number] DOB:Nov 20, 1960, 62 y.o., female Today's Date: 10/27/2022  END OF SESSION:  PT End of Session - 10/27/22 1004     Visit Number 12    Number of Visits 22    Date for PT Re-Evaluation 11/25/22    Authorization Type UHC    Progress Note Due on Visit 20    PT Start Time 1005    PT Stop Time 1045    PT Time Calculation (min) 40 min    Activity Tolerance Patient tolerated treatment well    Behavior During Therapy [facility] for tasks assessed/performed                     Past Medical History:  Diagnosis Date   Allergy    Phreesia 02/29/2020   Anxiety    Phreesia 02/29/2020   Hyperlipidemia    Phreesia 02/29/2020   Hypertension    Phreesia 02/29/2020   Insomnia    Osteoporosis    Phreesia 02/29/2020   Seasonal allergies    Past Surgical History:  Procedure Laterality Date   COLONOSCOPY  03/27/2011   Dr. Jena Gauss; entirely normal exam and recommended repeat in 10 years.   COLONOSCOPY WITH PROPOFOL N/A 08/28/2020   Procedure: COLONOSCOPY WITH PROPOFOL;  Surgeon: Corbin Ade, MD;  [facility]: [facility];  Service: Endoscopy;  Laterality: N/A;  8:30am   FEMUR IM NAIL Left 08/10/2022   Procedure: LEFT FEMUR INTRAMEDULLARY (IM) NAIL;  Surgeon: Cammy Copa, MD;  Location: [facility] OR;  Service: Orthopedics;  Laterality: Left;   Patient Active Problem List   Diagnosis Date Noted   Closed nondisplaced subtrochanteric fracture of femur with delayed healing 08/29/2022   Thigh pain, musculoskeletal, left 06/10/2022   GAD (generalized anxiety disorder) 06/10/2022   Hyperkalemia 11/20/2021   Encounter for general adult medical examination with abnormal findings 11/09/2021   Osteoporosis 03/19/2021   GERD (gastroesophageal reflux disease) 03/19/2021   HLD (hyperlipidemia) 03/19/2021   MDD (major depressive disorder), recurrent, in partial remission (HCC) 03/19/2021   Anemia 11/12/2020    Stage 3b chronic kidney disease (HCC) 08/05/2020   Colon cancer screening 06/23/2020   Insomnia, unspecified 03/03/2020   Essential hypertension, benign 03/03/2020   Seasonal allergic rhinitis 03/03/2020    PCP: Anabel Halon, MD   REFERRING PROVIDER: Cammy Copa, MD   REFERRING DIAG: [phone] (ICD-10-CM) - Stress fracture of femoral shaft, left, sequela   THERAPY DIAG:  Pain in left leg  Difficulty in walking, not elsewhere classified  Muscle weakness (generalized)  Rationale for Evaluation and Treatment: Rehabilitation  ONSET DATE: left femur intramedullary nail on 08/10/2022   SUBJECTIVE:   SUBJECTIVE STATEMENT: Pt indicated no complaints or changes since yesterdays visit.  Reported going home to sleep   PERTINENT HISTORY: s/p left femur fracture, no restrictions, WBAT, osteoporosis  PAIN:  NPRS scale: no specific number today.  Pain location: Rt thigh Pain description: ache, throb, pull Aggravating factors: getting in/out of car, putting weight on her left leg Relieving factors: over the counter NSAIDs, sitting with leg reclined  PRECAUTIONS: None  WEIGHT BEARING RESTRICTIONS: No  FALLS:  Has patient fallen in last 6 months? 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  Intramedullary nail in good  alignment without any complicating features.  No change in position  compared with intraoperative radiographs.   PATIENT SURVEYS:  10/21/22: FOTO improved to 47%  09/20/2022 Eval: FOTO functional intake 3%, goal is 47%   COGNITION: 09/20/2022 Overall cognitive status: Within functional limits for tasks  assessed     SENSATION: 09/20/2022 [facility]  MUSCLE LENGTH: 09/20/2022 Eval: Maisie Fus test: Left very tight   PALPATION: 09/20/2022 Eval: TTP in left quads  LOWER EXTREMITY ROM:  Active ROM Right 09/20/2022 Left 09/20/2022 Left 10/21/22  Hip flexion     Hip extension     Hip abduction     Hip adduction     Hip internal rotation     Hip external rotation     Knee flexion 135 125 129  Knee extension  0   Ankle dorsiflexion     Ankle plantarflexion     Ankle inversion     Ankle eversion      (Blank rows = not tested)  LOWER EXTREMITY MMT:  MMT Right 09/20/2022 Left 09/20/2022 Left 09/29/2022 Left 10/21/22  Hip flexion  3 4/5 4/5  Hip extension      Hip abduction  3+    Hip adduction      Hip internal rotation      Hip external rotation      Knee flexion 5 3+ 5/5 5/5  Knee extension 5 3+ 4/5 4+/5  Ankle dorsiflexion   5/5   Ankle plantarflexion      Ankle inversion      Ankle eversion       (Blank rows = not tested)  FUNCTIONAL TESTS:  10/05/2022:  Able to perform without UE assist  09/20/2022 Eval: Unable to sit to stand without UE support  GAIT: 10/27/2022:  Able to perform independent gait within clinic.   09/20/2022  Eval: Distance walked: 75 feet X 2 with bilateral crutches step to pattern, crutches appear to be too short for her but cannot be lengthened any more.                     TODAY'S TREATMENT:                                                                                DATE:  10/27/2022 Therex: Nu step L6 X 10 min UE/LE Leg press double leg 100  lbs 2 X15 , single leg 2 x 15 43 lbs , performed bilateral Seated knee extension machine double leg up single leg lowering 3 x 10 10 lbs, performed bilaterally  Step on over and down 4 inch step WB on Lt leg x 15  Neuro Re-ed SLS on foam mat with contralateral leg corner touching x 8 each, bilaterally with occasional HHA on bars Step to gait pattern over 9 inch hurdles forward 10 ft x 5 leading  with each LE in // bars with occasional HHA Lateral step over pattern 9 inch hurdle in // bars with occasional HHA 10 ft x 3 each way  TODAY'S TREATMENT:  DATE:  10/26/2022 Therex: Nu step L6 X 10 min UE/LE Step up forward 6 inch step x 15 bilateral no UE assist Leg press double leg 93 lbs 2 X15 , single leg 2 x 15 37 lbs , performed bilateral Seated knee extension machine double leg up single leg lowering 2 x 10 10 lbs, performed bilaterally  Squat to tap 18 inch chair with slow lowering focus x 10   Neuro Re-ed SLS on foam mat with contralateral leg corner touching x 8 each, bilaterally with occasional HHA on bars    TODAY'S TREATMENT:                                                                                DATE:  10/19/2022 Therex: Nu step L6 X 10 min UE/LE Supine figure 4 push away stretch Lt 30 sec X 2 Supine figure 4 pull towards stretch Lt 30 sec X 2 Supine bridge 2-3 sec hold X 15 Leg press double leg 87 lbs 2 X15 , single leg 2 x 15 37 lbs , performed bilateral Seated knee extension machine double leg up single leg lowering 3 x 10 5 lbs, performed bilaterally  Seated knee flexion machine 25# [id number] Step ups and over 6 inch step X 10 bilat forward and X 10 bilat lateral, no UE support  PATIENT EDUCATION: 10/19/2022 Education details: HEP update Person educated: Patient Education method: Programmer, multimedia, Demonstration, Verbal cues, and Handouts Education comprehension: verbalized understanding and needs further education   HOME EXERCISE PROGRAM: Access Code: [email or web address] URL: [email or web address] Date: 10/19/2022 Prepared by: Chyrel Masson  Exercises - Seated Knee Extension with Resistance  - 2 x daily - 6 x weekly - 1-3 sets - 10 reps - Hip Abduction with Resistance Loop  - 1 x daily - 4-5 x weekly - 1-3 sets - 10 reps - Standing Hip Flexion with Resistance Loop  - 1 x daily -  4-5 x weekly - 1-3 sets - 10 reps - Side to Side Weight Shift with Counter Support  - 1 x daily - 4-5 x weekly - 1-3 sets - 10 reps - Seated Straight Leg Heel Taps  - 1-2 x daily - 7 x weekly - 3 sets - 10 reps - Sit to Stand  - 3 x daily - 7 x weekly - 1 sets - 10 reps - Supine Figure 4 Piriformis Stretch  - 2 x daily - 7 x weekly - 1 sets - 5 reps - 15 hold - Supine Piriformis Stretch with Foot on Ground  - 2 x daily - 7 x weekly - 1 sets - 5 reps - 15 hold  ASSESSMENT:  CLINICAL IMPRESSION:  Pt was in agreement with plan to reduce frequency to promote improved confidence in HEP use over next few weeks.   Good tolerance to back to back days with progression noted in resistance activity.     OBJECTIVE IMPAIRMENTS: decreased activity tolerance, difficulty walking, decreased balance, decreased endurance, decreased mobility, decreased ROM, decreased strength, impaired flexibility, impaired UE/LE use, postural dysfunction, and pain.  ACTIVITY LIMITATIONS: bending, lifting, carry, locomotion, cleaning, community activity, driving, and or occupation  PERSONAL FACTORS: anx, htn, S/P left femur intramedullary nail  on 08/10/2022  are also affecting patient's functional outcome.  REHAB POTENTIAL: Good  CLINICAL DECISION MAKING: Stable/uncomplicated  EVALUATION COMPLEXITY: Low    GOALS: Short term PT Goals Target date: 10/18/2022   Pt will be I and compliant with HEP. Baseline:  Goal status: MET 10/12/22 Pt will decrease pain by 25% overall Baseline: Goal status: MET 10/12/22  Long term PT goals Target date:11/01/2022   Pt will improve left knee AROM >130 deg to improve functional mobility Baseline: Goal status: ongoing, has improved to 129 Pt will improve  hip/knee strength to at least 4+/5 MMT to improve functional strength Baseline: Goal status: ongoing 10/21/22 Pt will improve FOTO to the predicted value to show improved function Baseline: Goal status:MET 10/21/22 Pt will reduce  pain to overall less than 3/10 with usual activity and work activity. Baseline: Goal status: ongoing 10/21/22 Pt will be able to ambulate community distances at least 500 ft [facility] gait pattern and LRAD without complaints Baseline: Goal status: ongoing 10/21/22  PLAN: PT FREQUENCY: 2-3 times per week   PT DURATION: 4-6 weeks  PLANNED INTERVENTIONS (unless contraindicated): aquatic PT, Canalith repositioning, cryotherapy, Electrical stimulation, Iontophoresis with 4 mg/ml dexamethasome, Moist heat, traction, Ultrasound, gait training, Therapeutic exercise, balance training, neuromuscular re-education, patient/family education, prosthetic training, manual techniques, passive ROM, dry needling, taping, vasopnuematic device, vestibular, spinal manipulations, joint manipulations  PLAN FOR NEXT SESSION:  Dynamic balance improvements, continued strengthening.    Chyrel Masson, PT, DPT, OCS, ATC 10/27/22  10:41 AM

## 2022-10-29 ENCOUNTER — Encounter (HOSPITAL_BASED_OUTPATIENT_CLINIC_OR_DEPARTMENT_OTHER): Payer: Self-pay

## 2022-10-29 ENCOUNTER — Encounter: Payer: Self-pay | Admitting: Surgical

## 2022-10-29 MED ORDER — GABAPENTIN 300 MG PO CAPS: 300.0000 mg | ORAL_CAPSULE | Freq: Two times a day (BID) | ORAL | 0 refills | Status: DC

## 2022-10-29 NOTE — Telephone Encounter (Signed)
Note in chart 

## 2022-10-29 NOTE — Progress Notes (Signed)
Post-Op Visit Note   Patient: Carol Maldonado           Date of Birth: Dec 01, 1960           MRN: [id number] Visit Date: 10/25/2022 PCP: Anabel Halon, MD   Assessment & Plan:  Chief Complaint:  Chief Complaint  Patient presents with   Left Leg - Follow-up   Visit Diagnoses:  1. Stress fracture of femoral shaft, left, sequela     Plan: Patient is a 62 year old female who presents s/p left femur intramedullary nail for impending stress fracture on 08/10/2022.  Reports that in the last month she has had 50% improvement in her symptoms.  She ambulates with a cane and has gotten off the crutches entirely.  Still has some continued discomfort in her anterior thigh that will occasionally radiate down to the knee.  Taking 650 mg Tylenol twice daily in the morning and evening.  She was previously on Fosamax for 5 years but has now discontinued this.  She is going to physical therapy on Tuesday and Wednesday with Arlys John where they are working primarily on stepping up and across, leg press, elliptical work.  She works at [employer] and in order to return to work she really needs to be able to walk 1 mile from the parking lot into work which she cannot do at this point.  On exam, patient has incisions that are well-healed.  No calf tenderness.  Negative Homans' sign.  She has no knee effusion noted.  Able to perform straight leg raise and flex her hip actively with excellent strength.  She ambulates with mild antalgia.  Intact hip abduction.  No pain with hip range of motion.  Plan is to follow-up in 2 months for clinical recheck and continue with physical therapy in the meantime.  Will try gabapentin to see if this will help with some of her postoperative pain in addition to the Tylenol since she states she cannot really take anti-inflammatories.  She is scheduled for physical therapy through 8/29 and then will likely transition to home exercise program.  Follow-Up Instructions: No follow-ups  on file.   Orders:  Orders Placed This Encounter  Procedures   XR FEMUR MIN 2 VIEWS LEFT   No orders of the defined types were placed in this encounter.   Imaging: No results found.  PMFS History: Patient Active Problem List   Diagnosis Date Noted   Closed nondisplaced subtrochanteric fracture of femur with delayed healing 08/29/2022   Thigh pain, musculoskeletal, left 06/10/2022   GAD (generalized anxiety disorder) 06/10/2022   Hyperkalemia 11/20/2021   Encounter for general adult medical examination with abnormal findings 11/09/2021   Osteoporosis 03/19/2021   GERD (gastroesophageal reflux disease) 03/19/2021   HLD (hyperlipidemia) 03/19/2021   MDD (major depressive disorder), recurrent, in partial remission (HCC) 03/19/2021   Anemia 11/12/2020   Stage 3b chronic kidney disease (HCC) 08/05/2020   Colon cancer screening 06/23/2020   Insomnia, unspecified 03/03/2020   Essential hypertension, benign 03/03/2020   Seasonal allergic rhinitis 03/03/2020   Past Medical History:  Diagnosis Date   Allergy    Phreesia 02/29/2020   Anxiety    Phreesia 02/29/2020   Hyperlipidemia    Phreesia 02/29/2020   Hypertension    Phreesia 02/29/2020   Insomnia    Osteoporosis    Phreesia 02/29/2020   Seasonal allergies     Family History  Problem Relation Age of Onset   Hypertension Mother    Heart attack  Mother    Pneumonia Maternal Grandmother    Other Maternal Grandfather        MVA   Hypertension Father    High Cholesterol Father    Irritable bowel syndrome Sister    Irritable bowel syndrome Sister    Colon cancer Neg Hx     Past Surgical History:  Procedure Laterality Date   COLONOSCOPY  03/27/2011   Dr. Jena Gauss; entirely normal exam and recommended repeat in 10 years.   COLONOSCOPY WITH PROPOFOL N/A 08/28/2020   Procedure: COLONOSCOPY WITH PROPOFOL;  Surgeon: Corbin Ade, MD;  Location: [facility];  Service: Endoscopy;  Laterality: N/A;  8:30am   FEMUR IM NAIL  Left 08/10/2022   Procedure: LEFT FEMUR INTRAMEDULLARY (IM) NAIL;  Surgeon: Cammy Copa, MD;  Location: [facility];  Service: Orthopedics;  Laterality: Left;   Social History   Occupational History   Not on file  Tobacco Use   Smoking status: Never   Smokeless tobacco: Never  Vaping Use   Vaping status: Never Used  Substance and Sexual Activity   Alcohol use: Never    Alcohol/week: 0.0 standard drinks of alcohol   Drug use: Never   Sexual activity: Not Currently    Birth control/protection: Post-menopausal

## 2022-10-29 NOTE — Addendum Note (Signed)
Addended by: Julieanne Cotton on: 10/29/2022 10:32 AM   Modules accepted: Orders

## 2022-10-29 NOTE — Telephone Encounter (Signed)
Yes she is still out of work.  She is out of work until she can walk 1 mile without much difficulty as that is the distance from the parking lot to her workplace.  Will reevaluate her in 2 months but if she feels she is ready to go back to work before then, she will reach out to [place]

## 2022-11-02 ENCOUNTER — Ambulatory Visit: Payer: 59 | Admitting: Internal Medicine

## 2022-11-02 ENCOUNTER — Encounter: Payer: Self-pay | Admitting: Physical Therapy

## 2022-11-02 ENCOUNTER — Ambulatory Visit: Payer: 59 | Admitting: Physical Therapy

## 2022-11-02 DIAGNOSIS — R262 Difficulty in walking, not elsewhere classified: Secondary | ICD-10-CM | POA: Diagnosis not present

## 2022-11-02 DIAGNOSIS — M6281 Muscle weakness (generalized): Secondary | ICD-10-CM | POA: Diagnosis not present

## 2022-11-02 DIAGNOSIS — M79605 Pain in left leg: Secondary | ICD-10-CM

## 2022-11-02 NOTE — Therapy (Signed)
OUTPATIENT PHYSICAL THERAPY TREATMENT  Patient Name: Carol Maldonado MRN: [id number] DOB:May 05, 1960, 62 y.o., female Today's Date: 11/02/2022  END OF SESSION:  PT End of Session - 11/02/22 1355     Visit Number 13    Number of Visits 22    Date for PT Re-Evaluation 11/25/22    Authorization Type [employer]    Progress Note Due on Visit 20    PT Start Time 1342    PT Stop Time 1422    PT Time Calculation (min) 40 min    Activity Tolerance Patient tolerated treatment well    Behavior During Therapy [facility] for tasks assessed/performed                      Past Medical History:  Diagnosis Date   Allergy    Phreesia 02/29/2020   Anxiety    Phreesia 02/29/2020   Hyperlipidemia    Phreesia 02/29/2020   Hypertension    Phreesia 02/29/2020   Insomnia    Osteoporosis    Phreesia 02/29/2020   Seasonal allergies    Past Surgical History:  Procedure Laterality Date   COLONOSCOPY  03/27/2011   Dr. Jena Gauss; entirely normal exam and recommended repeat in 10 years.   COLONOSCOPY WITH PROPOFOL N/A 08/28/2020   Procedure: COLONOSCOPY WITH PROPOFOL;  Surgeon: Corbin Ade, MD;  [facility]: [facility];  Service: Endoscopy;  Laterality: N/A;  8:30am   FEMUR IM NAIL Left 08/10/2022   Procedure: LEFT FEMUR INTRAMEDULLARY (IM) NAIL;  Surgeon: Cammy Copa, MD;  Location: [facility];  Service: Orthopedics;  Laterality: Left;   Patient Active Problem List   Diagnosis Date Noted   Closed nondisplaced subtrochanteric fracture of femur with delayed healing 08/29/2022   Thigh pain, musculoskeletal, left 06/10/2022   GAD (generalized anxiety disorder) 06/10/2022   Hyperkalemia 11/20/2021   Encounter for general adult medical examination with abnormal findings 11/09/2021   Osteoporosis 03/19/2021   GERD (gastroesophageal reflux disease) 03/19/2021   HLD (hyperlipidemia) 03/19/2021   MDD (major depressive disorder), recurrent, in partial remission (HCC) 03/19/2021   Anemia 11/12/2020    Stage 3b chronic kidney disease (HCC) 08/05/2020   Colon cancer screening 06/23/2020   Insomnia, unspecified 03/03/2020   Essential hypertension, benign 03/03/2020   Seasonal allergic rhinitis 03/03/2020    PCP: Anabel Halon, MD   REFERRING PROVIDER: Cammy Copa, MD   REFERRING DIAG: [phone] (ICD-10-CM) - Stress fracture of femoral shaft, left, sequela   THERAPY DIAG:  Pain in left leg  Difficulty in walking, not elsewhere classified  Muscle weakness (generalized)  Rationale for Evaluation and Treatment: Rehabilitation  ONSET DATE: left femur intramedullary nail on 08/10/2022   SUBJECTIVE:   SUBJECTIVE STATEMENT: Pt indicated her leg is doing good today overall, no real pain upon arrival.  PERTINENT HISTORY: s/p left femur fracture, no restrictions, WBAT, osteoporosis  PAIN:  NPRS scale: no specific number today.  Pain location: Rt thigh Pain description: ache, throb, pull Aggravating factors: getting in/out of car, putting weight on her left leg Relieving factors: over the counter NSAIDs, sitting with leg reclined  PRECAUTIONS: None  WEIGHT BEARING RESTRICTIONS: No  FALLS:  Has patient fallen in last 6 months? 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  Intramedullary nail in good  alignment without any complicating features.  No change in position  compared with intraoperative radiographs.   PATIENT SURVEYS:  10/21/22: FOTO improved to 47%  09/20/2022 Eval: FOTO functional intake 3%, goal is 47%   COGNITION: 09/20/2022 Overall cognitive status: Within functional limits for tasks assessed     SENSATION: 09/20/2022 [facility]  MUSCLE  LENGTH: 09/20/2022 Eval: Maisie Fus test: Left very tight   PALPATION: 09/20/2022 Eval: TTP in left quads  LOWER EXTREMITY ROM:  Active ROM Right 09/20/2022 Left 09/20/2022 Left 10/21/22  Hip flexion     Hip extension     Hip abduction     Hip adduction     Hip internal rotation     Hip external rotation     Knee flexion 135 125 129  Knee extension  0   Ankle dorsiflexion     Ankle plantarflexion     Ankle inversion     Ankle eversion      (Blank rows = not tested)  LOWER EXTREMITY MMT:  MMT Right 09/20/2022 Left 09/20/2022 Left 09/29/2022 Left 10/21/22  Hip flexion  3 4/5 4/5  Hip extension      Hip abduction  3+    Hip adduction      Hip internal rotation      Hip external rotation      Knee flexion 5 3+ 5/5 5/5  Knee extension 5 3+ 4/5 4+/5  Ankle dorsiflexion   5/5   Ankle plantarflexion      Ankle inversion      Ankle eversion       (Blank rows = not tested)  FUNCTIONAL TESTS:  10/05/2022:  Able to perform without UE assist  09/20/2022 Eval: Unable to sit to stand without UE support  GAIT: 10/27/2022:  Able to perform independent gait within clinic.   09/20/2022  Eval: Distance walked: 75 feet X 2 with bilateral crutches step to pattern, crutches appear to be too short for her but cannot be lengthened any more.                     TODAY'S TREATMENT:                                                                                 DATE:  11/02/2022 Therex: Nu step L6 X 10 min UE/LE Leg press double leg 100  lbs 2 X15 , single leg 2 x 15 50 lbs , performed bilateral Seated knee extension machine double leg up single leg lowering 3 x 10 10 lbs, performed bilaterally  Step on over and down 6 inch step WB on Lt leg x 10, no UE support  Neuro Re-ed SLS on foam mat with contralateral leg corner touching x 8 each, bilaterally with occasional HHA on bars Step to gait pattern over 9 inch hurdles forward 10 ft x 3 leading with Rt leg and X 3 leading with Lt leg  // bars with occasional HHA Lateral step over pattern 9 inch hurdle in // bars with occasional HHA 10 ft x 3 each way DATE:  10/27/2022 Therex: Nu step  L6 X 10 min UE/LE Leg press double leg 100  lbs 2 X15 , single leg 2 x 15 43 lbs , performed bilateral Seated knee extension machine double leg up single leg lowering 3 x 10 10 lbs, performed bilaterally  Step on over and down 4 inch step WB on Lt leg x 15  Neuro Re-ed SLS on foam mat with contralateral leg corner touching x 8 each, bilaterally with occasional HHA on bars Step to gait pattern over 9 inch hurdles forward 10 ft x 5 leading with each LE in // bars with occasional HHA Lateral step over pattern 9 inch hurdle in // bars with occasional HHA 10 ft x 3 each way  TODAY'S TREATMENT:                                                                                DATE:  10/26/2022 Therex: Nu step L6 X 10 min UE/LE Step up forward 6 inch step x 15 bilateral no UE assist Leg press double leg 93 lbs 2 X15 , single leg 2 x 15 37 lbs , performed bilateral Seated knee extension machine double leg up single leg lowering 2 x 10 10 lbs, performed bilaterally  Squat to tap 18 inch chair with slow lowering focus x 10   Neuro Re-ed SLS on foam mat with contralateral leg corner touching x 8 each, bilaterally with occasional HHA on bars    TODAY'S TREATMENT:                                                                                DATE:  10/19/2022 Therex: Nu step L6 X 10 min UE/LE Supine figure 4 push away stretch Lt 30 sec X 2 Supine figure 4 pull towards stretch Lt 30 sec X 2 Supine bridge 2-3 sec hold X 15 Leg press double leg 87 lbs 2 X15 , single leg 2 x 15 37 lbs , performed bilateral Seated knee extension machine double leg up single leg lowering 3 x 10 5 lbs, performed bilaterally  Seated knee flexion machine 25# DL [id number] Step ups and over 6 inch step X 10 bilat forward and X 10 bilat lateral, no UE support  PATIENT  EDUCATION: 10/19/2022 Education details: HEP update Person educated: Patient Education method: Programmer, multimedia, Demonstration, Verbal cues, and Handouts Education comprehension: verbalized understanding and needs further education   HOME EXERCISE PROGRAM: Access Code: [email or web address] URL: [email or web address] Date: 10/19/2022 Prepared by: Chyrel Masson  Exercises - Seated Knee Extension with Resistance  - 2 x daily - 6 x weekly - 1-3 sets - 10 reps - Hip Abduction with Resistance Loop  - 1 x daily - 4-5 x weekly - 1-3 sets - 10 reps - Standing Hip Flexion with Resistance Loop  - 1 x daily - 4-5 x weekly - 1-3 sets - 10 reps - Side to Side Weight Shift with  Counter Support  - 1 x daily - 4-5 x weekly - 1-3 sets - 10 reps - Seated Straight Leg Heel Taps  - 1-2 x daily - 7 x weekly - 3 sets - 10 reps - Sit to Stand  - 3 x daily - 7 x weekly - 1 sets - 10 reps - Supine Figure 4 Piriformis Stretch  - 2 x daily - 7 x weekly - 1 sets - 5 reps - 15 hold - Supine Piriformis Stretch with Foot on Ground  - 2 x daily - 7 x weekly - 1 sets - 5 reps - 15 hold  ASSESSMENT:  CLINICAL IMPRESSION:  I increased weight on leg press machine and increased height of step for step up/down exercises and she had good overall tolerance this progression and is improving her strength and function with PT.     OBJECTIVE IMPAIRMENTS: decreased activity tolerance, difficulty walking, decreased balance, decreased endurance, decreased mobility, decreased ROM, decreased strength, impaired flexibility, impaired UE/LE use, postural dysfunction, and pain.  ACTIVITY LIMITATIONS: bending, lifting, carry, locomotion, cleaning, community activity, driving, and or occupation  PERSONAL FACTORS: anx, htn, S/P left femur intramedullary nail on 08/10/2022  are also affecting patient's functional outcome.  REHAB POTENTIAL: Good  CLINICAL DECISION MAKING: Stable/uncomplicated  EVALUATION COMPLEXITY:  Low    GOALS: Short term PT Goals Target date: 10/18/2022   Pt will be I and compliant with HEP. Baseline:  Goal status: MET 10/12/22 Pt will decrease pain by 25% overall Baseline: Goal status: MET 10/12/22  Long term PT goals Target date:11/01/2022   Pt will improve left knee AROM >130 deg to improve functional mobility Baseline: Goal status: ongoing, has improved to 129 Pt will improve  hip/knee strength to at least 4+/5 MMT to improve functional strength Baseline: Goal status: ongoing 10/21/22 Pt will improve FOTO to the predicted value to show improved function Baseline: Goal status:MET 10/21/22 Pt will reduce pain to overall less than 3/10 with usual activity and work activity. Baseline: Goal status: ongoing 10/21/22 Pt will be able to ambulate community distances at least 500 ft [facility] gait pattern and LRAD without complaints Baseline: Goal status: ongoing 10/21/22  PLAN: PT FREQUENCY: 2-3 times per week   PT DURATION: 4-6 weeks  PLANNED INTERVENTIONS (unless contraindicated): aquatic PT, Canalith repositioning, cryotherapy, Electrical stimulation, Iontophoresis with 4 mg/ml dexamethasome, Moist heat, traction, Ultrasound, gait training, Therapeutic exercise, balance training, neuromuscular re-education, patient/family education, prosthetic training, manual techniques, passive ROM, dry needling, taping, vasopnuematic device, vestibular, spinal manipulations, joint manipulations  PLAN FOR NEXT SESSION:  Dynamic balance improvements, continued strengthening.   Ivery Quale, PT, DPT 11/02/22 1:56 PM

## 2022-11-04 ENCOUNTER — Encounter: Payer: 59 | Admitting: Rehabilitative and Restorative Service Providers"

## 2022-11-08 ENCOUNTER — Encounter: Payer: 59 | Admitting: Rehabilitative and Restorative Service Providers"

## 2022-11-10 ENCOUNTER — Encounter: Payer: Self-pay | Admitting: Rehabilitative and Restorative Service Providers"

## 2022-11-10 ENCOUNTER — Ambulatory Visit: Payer: 59 | Admitting: Rehabilitative and Restorative Service Providers"

## 2022-11-10 DIAGNOSIS — M6281 Muscle weakness (generalized): Secondary | ICD-10-CM | POA: Diagnosis not present

## 2022-11-10 DIAGNOSIS — M79605 Pain in left leg: Secondary | ICD-10-CM

## 2022-11-10 DIAGNOSIS — R262 Difficulty in walking, not elsewhere classified: Secondary | ICD-10-CM | POA: Diagnosis not present

## 2022-11-10 NOTE — Therapy (Signed)
OUTPATIENT PHYSICAL THERAPY TREATMENT  Patient Name: Carol Maldonado MRN: [id number] DOB:01-09-1961, 62 y.o., female Today's Date: 11/10/2022  END OF SESSION:  PT End of Session - 11/10/22 1425     Visit Number 14    Number of Visits 22    Date for PT Re-Evaluation 11/25/22    Authorization Type UHC    Progress Note Due on Visit 20    PT Start Time 1425    PT Stop Time 1504    PT Time Calculation (min) 39 min    Activity Tolerance Patient tolerated treatment well    Behavior During Therapy [facility] for tasks assessed/performed               Past Medical History:  Diagnosis Date   Allergy    Phreesia 02/29/2020   Anxiety    Phreesia 02/29/2020   Hyperlipidemia    Phreesia 02/29/2020   Hypertension    Phreesia 02/29/2020   Insomnia    Osteoporosis    Phreesia 02/29/2020   Seasonal allergies    Past Surgical History:  Procedure Laterality Date   COLONOSCOPY  03/27/2011   Dr. Jena Gauss; entirely normal exam and recommended repeat in 10 years.   COLONOSCOPY WITH PROPOFOL N/A 08/28/2020   Procedure: COLONOSCOPY WITH PROPOFOL;  Surgeon: Corbin Ade, MD;  [facility];  Service: Endoscopy;  Laterality: N/A;  8:30am   FEMUR IM NAIL Left 08/10/2022   Procedure: LEFT FEMUR INTRAMEDULLARY (IM) NAIL;  Surgeon: Cammy Copa, MD;  Location: [facility] OR;  Service: Orthopedics;  Laterality: Left;   Patient Active Problem List   Diagnosis Date Noted   Closed nondisplaced subtrochanteric fracture of femur with delayed healing 08/29/2022   Thigh pain, musculoskeletal, left 06/10/2022   GAD (generalized anxiety disorder) 06/10/2022   Hyperkalemia 11/20/2021   Encounter for general adult medical examination with abnormal findings 11/09/2021   Osteoporosis 03/19/2021   GERD (gastroesophageal reflux disease) 03/19/2021   HLD (hyperlipidemia) 03/19/2021   MDD (major depressive disorder), recurrent, in partial remission (HCC) 03/19/2021   Anemia 11/12/2020   Stage 3b  chronic kidney disease (HCC) 08/05/2020   Colon cancer screening 06/23/2020   Insomnia, unspecified 03/03/2020   Essential hypertension, benign 03/03/2020   Seasonal allergic rhinitis 03/03/2020    PCP: Anabel Halon, MD   REFERRING PROVIDER: Cammy Copa, MD   REFERRING DIAG: [phone] (ICD-10-CM) - Stress fracture of femoral shaft, left, sequela   THERAPY DIAG:  Pain in left leg  Difficulty in walking, not elsewhere classified  Muscle weakness (generalized)  Rationale for Evaluation and Treatment: Rehabilitation  ONSET DATE: left femur intramedullary nail on 08/10/2022   SUBJECTIVE:   SUBJECTIVE STATEMENT: Pt indicated feeling about 65% overall improvement to normal.   Reported having severe increase of pain that woke her up yesterday but took medicine to help it.    PERTINENT HISTORY: s/p left femur fracture, no restrictions, WBAT, osteoporosis  PAIN:  NPRS scale: no pain upon arrival.  Weather related pain 10/10 Pain location: Rt thigh Pain description: ache, throb, pull Aggravating factors: getting in/out of car, putting weight on her left leg Relieving factors: over the counter NSAIDs, sitting with leg reclined  PRECAUTIONS: None  WEIGHT BEARING RESTRICTIONS: No  FALLS:  Has patient fallen in last 6 months? 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  Intramedullary nail in good  alignment without any complicating features.  No change in position  compared with intraoperative radiographs.   PATIENT SURVEYS:  10/21/22: FOTO improved to 47%  09/20/2022 Eval: FOTO functional intake 3%, goal is 47%   COGNITION: 09/20/2022 Overall  cognitive status: Within functional limits for tasks assessed     SENSATION: 09/20/2022 [facility]  MUSCLE LENGTH: 09/20/2022 Eval: Maisie Fus test: Left very tight   PALPATION: 09/20/2022 Eval: TTP in left quads  LOWER EXTREMITY ROM:  Active ROM Right 09/20/2022 Left 09/20/2022 Left 10/21/22  Hip flexion     Hip extension     Hip abduction     Hip adduction     Hip internal rotation     Hip external rotation     Knee flexion 135 125 129  Knee extension  0   Ankle dorsiflexion     Ankle plantarflexion     Ankle inversion     Ankle eversion      (Blank rows = not tested)  LOWER EXTREMITY MMT:  MMT Right 09/20/2022 Left 09/20/2022 Left 09/29/2022 Left 10/21/22 Left 11/10/2022  Hip flexion  3 4/5 4/5   Hip extension       Hip abduction  3+     Hip adduction       Hip internal rotation       Hip external rotation       Knee flexion 5 3+ 5/5 5/5 5/5  Knee extension 5 3+ 4/5 4+/5 5/5  Ankle dorsiflexion   5/5    Ankle plantarflexion       Ankle inversion       Ankle eversion        (Blank rows = not tested)  FUNCTIONAL TESTS:  10/05/2022:  Able to perform without UE assist sit to stand  09/20/2022 Eval: Unable to sit to stand without UE support  GAIT: 10/27/2022:  Able to perform independent gait within clinic.   09/20/2022  Eval: Distance walked: 75 feet X 2 with bilateral crutches step to pattern, crutches appear to be too short for her but cannot be lengthened any more.                     TODAY'S TREATMENT:                                                                               DATE:  11/10/2022 Therex: UBE LE only lvl 2.5 10 mins  Seated knee extension machine double leg up single leg lowering 2 x 15 10 lbs, performed for Lt  Knee flexion machine Lt leg only 3 x 10 10 lbs  Forward step up WB on Lt leg 6 inch x 15 Lateral step down WB on Lt leg 6 inch x15  Neuro Re-ed Tandem ambulation fwd/back on foam 6 ft x 6 each way in // bars with occasional  HHA Vector stepping fwd, lateral, reverse with SLS focus x 8 each way, performed bilaterally - focus on light touch down with reach leg   TODAY'S TREATMENT:  DATE:  11/02/2022 Therex: Nu step L6 X 10 min UE/LE Leg press double leg 100  lbs 2 X15 , single leg 2 x 15 50 lbs , performed bilateral Seated knee extension machine double leg up single leg lowering 3 x 10 10 lbs, performed bilaterally  Step on over and down 6 inch step WB on Lt leg x 10, no UE support  Neuro Re-ed SLS on foam mat with contralateral leg corner touching x 8 each, bilaterally with occasional HHA on bars Step to gait pattern over 9 inch hurdles forward 10 ft x 3 leading with Rt leg and X 3 leading with Lt leg // bars with occasional HHA Lateral step over pattern 9 inch hurdle in // bars with occasional HHA 10 ft x 3 each way  TODAY'S TREATMENT:                                                                               DATE:  10/27/2022 Therex: Nu step L6 X 10 min UE/LE Leg press double leg 100  lbs 2 X15 , single leg 2 x 15 43 lbs , performed bilateral Seated knee extension machine double leg up single leg lowering 3 x 10 10 lbs, performed bilaterally  Step on over and down 4 inch step WB on Lt leg x 15  Neuro Re-ed SLS on foam mat with contralateral leg corner touching x 8 each, bilaterally with occasional HHA on bars Step to gait pattern over 9 inch hurdles forward 10 ft x 5 leading with each LE in // bars with occasional HHA Lateral step over pattern 9 inch hurdle in // bars with occasional HHA 10 ft x 3 each way   PATIENT EDUCATION: 10/19/2022 Education details: HEP update Person educated: Patient Education method: Programmer, multimedia, Demonstration, Verbal cues, and Handouts Education comprehension: verbalized understanding and needs further education   HOME EXERCISE PROGRAM: Access Code: [email or web address] URL:  [email or web address] Date: 10/19/2022 Prepared by: Chyrel Masson  Exercises - Seated Knee Extension with Resistance  - 2 x daily - 6 x weekly - 1-3 sets - 10 reps - Hip Abduction with Resistance Loop  - 1 x daily - 4-5 x weekly - 1-3 sets - 10 reps - Standing Hip Flexion with Resistance Loop  - 1 x daily - 4-5 x weekly - 1-3 sets - 10 reps - Side to Side Weight Shift with Counter Support  - 1 x daily - 4-5 x weekly - 1-3 sets - 10 reps - Seated Straight Leg Heel Taps  - 1-2 x daily - 7 x weekly - 3 sets - 10 reps - Sit to Stand  - 3 x daily - 7 x weekly - 1 sets - 10 reps - Supine Figure 4 Piriformis Stretch  - 2 x daily - 7 x weekly - 1 sets - 5 reps - 15 hold - Supine Piriformis Stretch with Foot on Ground  - 2 x daily - 7 x weekly - 1 sets - 5 reps - 15 hold  ASSESSMENT:  CLINICAL IMPRESSION:  Continued progressive strengthening in WB and NWB activity with focus on improving endurance to reduce fatigue with activity.   Pt showing progress towards future HEP transitioning  in future as presentation continues to improve.  Continued skilled PT services in clinic still indicated at this time to improve stability without cane and functional movement endurance.    OBJECTIVE IMPAIRMENTS: decreased activity tolerance, difficulty walking, decreased balance, decreased endurance, decreased mobility, decreased ROM, decreased strength, impaired flexibility, impaired UE/LE use, postural dysfunction, and pain.  ACTIVITY LIMITATIONS: bending, lifting, carry, locomotion, cleaning, community activity, driving, and or occupation  PERSONAL FACTORS: anx, htn, S/P left femur intramedullary nail on 08/10/2022  are also affecting patient's functional outcome.  REHAB POTENTIAL: Good  CLINICAL DECISION MAKING: Stable/uncomplicated  EVALUATION COMPLEXITY: Low    GOALS: Short term PT Goals Target date: 10/18/2022   Pt will be I and compliant with HEP. Baseline:  Goal status: MET  10/12/22 Pt will decrease pain by 25% overall Baseline: Goal status: MET 10/12/22  Long term PT goals Target date:11/01/2022   Pt will improve left knee AROM >130 deg to improve functional mobility Baseline: Goal status: ongoing, has improved to 129 Pt will improve  hip/knee strength to at least 4+/5 MMT to improve functional strength Baseline: Goal status: ongoing 10/21/22 Pt will improve FOTO to the predicted value to show improved function Baseline: Goal status:MET 10/21/22 Pt will reduce pain to overall less than 3/10 with usual activity and work activity. Baseline: Goal status: ongoing 10/21/22 Pt will be able to ambulate community distances at least 500 ft [facility] gait pattern and LRAD without complaints Baseline: Goal status: ongoing 10/21/22  PLAN: PT FREQUENCY: 2-3 times per week   PT DURATION: 4-6 weeks  PLANNED INTERVENTIONS (unless contraindicated): aquatic PT, Canalith repositioning, cryotherapy, Electrical stimulation, Iontophoresis with 4 mg/ml dexamethasome, Moist heat, traction, Ultrasound, gait training, Therapeutic exercise, balance training, neuromuscular re-education, patient/family education, prosthetic training, manual techniques, passive ROM, dry needling, taping, vasopnuematic device, vestibular, spinal manipulations, joint manipulations  PLAN FOR NEXT SESSION:  LTG reassessment, possible FOTO update.   Chyrel Masson, PT, DPT, OCS, ATC 11/10/22  2:57 PM

## 2022-11-16 ENCOUNTER — Encounter: Payer: Self-pay | Admitting: Rehabilitative and Restorative Service Providers"

## 2022-11-16 ENCOUNTER — Ambulatory Visit: Payer: 59 | Admitting: Rehabilitative and Restorative Service Providers"

## 2022-11-16 DIAGNOSIS — M79605 Pain in left leg: Secondary | ICD-10-CM | POA: Diagnosis not present

## 2022-11-16 DIAGNOSIS — R262 Difficulty in walking, not elsewhere classified: Secondary | ICD-10-CM | POA: Diagnosis not present

## 2022-11-16 DIAGNOSIS — M6281 Muscle weakness (generalized): Secondary | ICD-10-CM

## 2022-11-16 NOTE — Therapy (Signed)
OUTPATIENT PHYSICAL THERAPY TREATMENT  Patient Name: Carol Maldonado MRN: [id number] DOB:Jan 11, 1961, 62 y.o., female Today's Date: 11/16/2022  END OF SESSION:  PT End of Session - 11/16/22 1502     Visit Number 15    Number of Visits 22    Date for PT Re-Evaluation 11/25/22    Authorization Type UHC    Progress Note Due on Visit 20    PT Start Time 1503    PT Stop Time 1542    PT Time Calculation (min) 39 min    Activity Tolerance Patient tolerated treatment well    Behavior During Therapy [facility] for tasks assessed/performed                Past Medical History:  Diagnosis Date   Allergy    Phreesia 02/29/2020   Anxiety    Phreesia 02/29/2020   Hyperlipidemia    Phreesia 02/29/2020   Hypertension    Phreesia 02/29/2020   Insomnia    Osteoporosis    Phreesia 02/29/2020   Seasonal allergies    Past Surgical History:  Procedure Laterality Date   COLONOSCOPY  03/27/2011   Dr. Jena Gauss; entirely normal exam and recommended repeat in 10 years.   COLONOSCOPY WITH PROPOFOL N/A 08/28/2020   Procedure: COLONOSCOPY WITH PROPOFOL;  Surgeon: Corbin Ade, MD;  [facility];  Service: Endoscopy;  Laterality: N/A;  8:30am   FEMUR IM NAIL Left 08/10/2022   Procedure: LEFT FEMUR INTRAMEDULLARY (IM) NAIL;  Surgeon: Cammy Copa, MD;  Location: [facility] OR;  Service: Orthopedics;  Laterality: Left;   Patient Active Problem List   Diagnosis Date Noted   Closed nondisplaced subtrochanteric fracture of femur with delayed healing 08/29/2022   Thigh pain, musculoskeletal, left 06/10/2022   GAD (generalized anxiety disorder) 06/10/2022   Hyperkalemia 11/20/2021   Encounter for general adult medical examination with abnormal findings 11/09/2021   Osteoporosis 03/19/2021   GERD (gastroesophageal reflux disease) 03/19/2021   HLD (hyperlipidemia) 03/19/2021   MDD (major depressive disorder), recurrent, in partial remission (HCC) 03/19/2021   Anemia 11/12/2020   Stage 3b  chronic kidney disease (HCC) 08/05/2020   Colon cancer screening 06/23/2020   Insomnia, unspecified 03/03/2020   Essential hypertension, benign 03/03/2020   Seasonal allergic rhinitis 03/03/2020    PCP: Anabel Halon, MD   REFERRING PROVIDER: Cammy Copa, MD   REFERRING DIAG: [phone] (ICD-10-CM) - Stress fracture of femoral shaft, left, sequela   THERAPY DIAG:  Pain in left leg  Difficulty in walking, not elsewhere classified  Muscle weakness (generalized)  Rationale for Evaluation and Treatment: Rehabilitation  ONSET DATE: left femur intramedullary nail on 08/10/2022   SUBJECTIVE:   SUBJECTIVE STATEMENT: Pt indicated having two days of waking up with some pain complaints but medicine helps the pain.   PERTINENT HISTORY: s/p left femur fracture, no restrictions, WBAT, osteoporosis  PAIN:  NPRS scale: no pain upon arrival.   Pain location: Lt thigh Pain description: throbbing, shooting Aggravating factors: unsure Relieving factors: over the counter NSAIDs, sitting with leg reclined  PRECAUTIONS: None  WEIGHT BEARING RESTRICTIONS: No  FALLS:  Has patient fallen in last 6 months? 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  Intramedullary  nail in good  alignment without any complicating features.  No change in position  compared with intraoperative radiographs.   PATIENT SURVEYS:  10/21/22: FOTO improved to 47%  09/20/2022 Eval: FOTO functional intake 3%, goal is 47%   COGNITION: 09/20/2022 Overall cognitive status: Within functional limits for tasks assessed     SENSATION: 09/20/2022 [facility]  MUSCLE LENGTH: 09/20/2022 Eval: Maisie Fus  test: Left very tight   PALPATION: 09/20/2022 Eval: TTP in left quads  LOWER EXTREMITY ROM:  Active ROM Right 09/20/2022 Left 09/20/2022 Left 10/21/22  Hip flexion     Hip extension     Hip abduction     Hip adduction     Hip internal rotation     Hip external rotation     Knee flexion 135 125 129  Knee extension  0   Ankle dorsiflexion     Ankle plantarflexion     Ankle inversion     Ankle eversion      (Blank rows = not tested)  LOWER EXTREMITY MMT:  MMT Right 09/20/2022 Left 09/20/2022 Left 09/29/2022 Left 10/21/22 Left 11/10/2022 Left 11/16/2022  Hip flexion  3 4/5 4/5  5/5  Hip extension        Hip abduction  3+      Hip adduction        Hip internal rotation        Hip external rotation        Knee flexion 5 3+ 5/5 5/5 5/5   Knee extension 5 3+ 4/5 4+/5 5/5   Ankle dorsiflexion   5/5     Ankle plantarflexion        Ankle inversion        Ankle eversion         (Blank rows = not tested)  FUNCTIONAL TESTS:  10/05/2022:  Able to perform without UE assist sit to stand  09/20/2022 Eval: Unable to sit to stand without UE support  GAIT: 10/27/2022:  Able to perform independent gait within clinic.   09/20/2022  Eval: Distance walked: 75 feet X 2 with bilateral crutches step to pattern, crutches appear to be too short for her but cannot be lengthened any more.                     TODAY'S TREATMENT:                                                                               DATE:  11/16/2022 Therex: UBE LE only lvl 2.5 10 mins  Leg press double leg 106 lbs x 15, single 2 x 15 50 lbs  performed bilaterally Lateral step down 4 inch step with foam pad  x15, performed bilaterally   Neuro Re-ed 9 inch hurdle step to pattern 10 ft x 5 leading with each leg in // bars, minimal HHA 9 inch lateral step over 10 ft x 3 each way in // bars, minimal HHA Vector stepping fwd, lateral, reverse with SLS focus x 8 each way, performed bilaterally - focus on light touch  down with reach leg  TODAY'S TREATMENT:  DATE:  11/10/2022 Therex: UBE LE only lvl 2.5 10 mins  Seated knee extension machine double leg up single leg lowering 2 x 15 10 lbs, performed for Lt  Knee flexion machine Lt leg only 3 x 10 10 lbs  Forward step up WB on Lt leg 6 inch x 15 Lateral step down WB on Lt leg 6 inch x15  Neuro Re-ed Tandem ambulation fwd/back on foam 6 ft x 6 each way in // bars with occasional HHA Vector stepping fwd, lateral, reverse with SLS focus x 8 each way, performed bilaterally - focus on light touch down with reach leg   TODAY'S TREATMENT:                                                                               DATE:  11/02/2022 Therex: Nu step L6 X 10 min UE/LE Leg press double leg 100  lbs 2 X15 , single leg 2 x 15 50 lbs , performed bilateral Seated knee extension machine double leg up single leg lowering 3 x 10 10 lbs, performed bilaterally  Step on over and down 6 inch step WB on Lt leg x 10, no UE support  Neuro Re-ed SLS on foam mat with contralateral leg corner touching x 8 each, bilaterally with occasional HHA on bars Step to gait pattern over 9 inch hurdles forward 10 ft x 3 leading with Rt leg and X 3 leading with Lt leg // bars with occasional HHA Lateral step over pattern 9 inch hurdle in // bars with occasional HHA 10 ft x 3 each way    PATIENT EDUCATION: 10/19/2022 Education details: HEP update Person educated: Patient Education method: Programmer, multimedia, Demonstration, Verbal cues, and Handouts Education comprehension: verbalized understanding and needs further education   HOME EXERCISE PROGRAM: Access Code: [email or web address] URL: [email or web address] Date: 10/19/2022 Prepared by: Chyrel Masson  Exercises - Seated Knee Extension with Resistance  - 2 x daily - 6 x weekly - 1-3 sets - 10 reps - Hip Abduction with Resistance Loop  - 1 x daily - 4-5 x  weekly - 1-3 sets - 10 reps - Standing Hip Flexion with Resistance Loop  - 1 x daily - 4-5 x weekly - 1-3 sets - 10 reps - Side to Side Weight Shift with Counter Support  - 1 x daily - 4-5 x weekly - 1-3 sets - 10 reps - Seated Straight Leg Heel Taps  - 1-2 x daily - 7 x weekly - 3 sets - 10 reps - Sit to Stand  - 3 x daily - 7 x weekly - 1 sets - 10 reps - Supine Figure 4 Piriformis Stretch  - 2 x daily - 7 x weekly - 1 sets - 5 reps - 15 hold - Supine Piriformis Stretch with Foot on Ground  - 2 x daily - 7 x weekly - 1 sets - 5 reps - 15 hold  ASSESSMENT:  CLINICAL IMPRESSION:  Pt did have a few occasions of random onset of pain in thigh that was helped by medicine.  Otherwise during the day no pain complaints reported.  Use of SPC in public still noted.  General stability could continue to improve.  OBJECTIVE IMPAIRMENTS: decreased activity tolerance, difficulty walking, decreased balance, decreased endurance, decreased mobility, decreased ROM, decreased strength, impaired flexibility, impaired UE/LE use, postural dysfunction, and pain.  ACTIVITY LIMITATIONS: bending, lifting, carry, locomotion, cleaning, community activity, driving, and or occupation  PERSONAL FACTORS: anx, htn, S/P left femur intramedullary nail on 08/10/2022  are also affecting patient's functional outcome.  REHAB POTENTIAL: Good  CLINICAL DECISION MAKING: Stable/uncomplicated  EVALUATION COMPLEXITY: Low    GOALS: Short term PT Goals Target date: 10/18/2022   Pt will be I and compliant with HEP. Baseline:  Goal status: MET 10/12/22 Pt will decrease pain by 25% overall Baseline: Goal status: MET 10/12/22  Long term PT goals Target date:11/01/2022   Pt will improve left knee AROM >130 deg to improve functional mobility Baseline: Goal status: mostly met 11/16/2022  Pt will improve  hip/knee strength to at least 4+/5 MMT to improve functional strength Baseline: Goal status: Met 11/16/2022  Pt will  improve FOTO to the predicted value to show improved function Baseline: Goal status:MET 10/21/22  Pt will reduce pain to overall less than 3/10 with usual activity and work activity. Baseline: Goal status: mostly met 11/16/2022  Pt will be able to ambulate community distances at least 500 ft [facility] gait pattern and LRAD without complaints Baseline: Goal status: on going 11/16/2022  PLAN: PT FREQUENCY: 2-3 times per week   PT DURATION: 4-6 weeks  PLANNED INTERVENTIONS (unless contraindicated): aquatic PT, Canalith repositioning, cryotherapy, Electrical stimulation, Iontophoresis with 4 mg/ml dexamethasome, Moist heat, traction, Ultrasound, gait training, Therapeutic exercise, balance training, neuromuscular re-education, patient/family education, prosthetic training, manual techniques, passive ROM, dry needling, taping, vasopnuematic device, vestibular, spinal manipulations, joint manipulations  PLAN FOR NEXT SESSION:  MD Note/ trial HEP period.   Chyrel Masson, PT, DPT, OCS, ATC 11/16/22  3:42 PM

## 2022-11-18 ENCOUNTER — Encounter: Payer: 59 | Admitting: Rehabilitative and Restorative Service Providers"

## 2022-11-23 ENCOUNTER — Encounter: Payer: Self-pay | Admitting: Rehabilitative and Restorative Service Providers"

## 2022-11-23 ENCOUNTER — Ambulatory Visit: Payer: 59 | Admitting: Rehabilitative and Restorative Service Providers"

## 2022-11-23 DIAGNOSIS — M6281 Muscle weakness (generalized): Secondary | ICD-10-CM | POA: Diagnosis not present

## 2022-11-23 DIAGNOSIS — R262 Difficulty in walking, not elsewhere classified: Secondary | ICD-10-CM | POA: Diagnosis not present

## 2022-11-23 DIAGNOSIS — M79605 Pain in left leg: Secondary | ICD-10-CM | POA: Diagnosis not present

## 2022-11-23 NOTE — Therapy (Signed)
OUTPATIENT PHYSICAL THERAPY TREATMENT  Patient Name: TRICHA ZIEBELL MRN: [id number] DOB:01/01/1961, 62 y.o., female Today's Date: 11/23/2022  END OF SESSION:  PT End of Session - 11/23/22 1502     Visit Number 16    Number of Visits 22    Date for PT Re-Evaluation 11/25/22    Authorization Type U[employer]    Progress Note Due on Visit 20    PT Start Time 1500    PT Stop Time 1540    PT Time Calculation (min) 40 min    Activity Tolerance Patient tolerated treatment well    Behavior During Therapy [facility] for tasks assessed/performed                 Past Medical History:  Diagnosis Date   Allergy    Phreesia 02/29/2020   Anxiety    Phreesia 02/29/2020   Hyperlipidemia    Phreesia 02/29/2020   Hypertension    Phreesia 02/29/2020   Insomnia    Osteoporosis    Phreesia 02/29/2020   Seasonal allergies    Past Surgical History:  Procedure Laterality Date   COLONOSCOPY  03/27/2011   Dr. Jena Gauss; entirely normal exam and recommended repeat in 10 years.   COLONOSCOPY WITH PROPOFOL N/A 08/28/2020   Procedure: COLONOSCOPY WITH PROPOFOL;  Surgeon: Corbin Ade, MD;  [facility];  Service: Endoscopy;  Laterality: N/A;  8:30am   FEMUR IM NAIL Left 08/10/2022   Procedure: LEFT FEMUR INTRAMEDULLARY (IM) NAIL;  Surgeon: Cammy Copa, MD;  Location: [facility] OR;  Service: Orthopedics;  Laterality: Left;   Patient Active Problem List   Diagnosis Date Noted   Closed nondisplaced subtrochanteric fracture of femur with delayed healing 08/29/2022   Thigh pain, musculoskeletal, left 06/10/2022   GAD (generalized anxiety disorder) 06/10/2022   Hyperkalemia 11/20/2021   Encounter for general adult medical examination with abnormal findings 11/09/2021   Osteoporosis 03/19/2021   GERD (gastroesophageal reflux disease) 03/19/2021   HLD (hyperlipidemia) 03/19/2021   MDD (major depressive disorder), recurrent, in partial remission (HCC) 03/19/2021   Anemia 11/12/2020   Stage 3b  chronic kidney disease (HCC) 08/05/2020   Colon cancer screening 06/23/2020   Insomnia, unspecified 03/03/2020   Essential hypertension, benign 03/03/2020   Seasonal allergic rhinitis 03/03/2020    PCP: Anabel Halon, MD   REFERRING PROVIDER: Cammy Copa, MD   REFERRING DIAG: [phone] (ICD-10-CM) - Stress fracture of femoral shaft, left, sequela   THERAPY DIAG:  Pain in left leg  Difficulty in walking, not elsewhere classified  Muscle weakness (generalized)  Rationale for Evaluation and Treatment: Rehabilitation  ONSET DATE: left femur intramedullary nail on 08/10/2022   SUBJECTIVE:   SUBJECTIVE STATEMENT: Pt indicated complaints Sunday night, took medicine to help with heating pad.  Noticed random at night.  PERTINENT HISTORY: s/p left femur fracture, no restrictions, WBAT, osteoporosis  PAIN:  NPRS scale: no pain upon arrival.   Pain location: Lt thigh Pain description: throbbing, shooting Aggravating factors: unsure Relieving factors: over the counter NSAIDs, sitting with leg reclined  PRECAUTIONS: None  WEIGHT BEARING RESTRICTIONS: No  FALLS:  Has patient fallen in last 6 months? 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  Intramedullary  nail in good  alignment without any complicating features.  No change in position  compared with intraoperative radiographs.   PATIENT SURVEYS:  10/21/22: FOTO improved to 47%  09/20/2022 Eval: FOTO functional intake 3%, goal is 47%   COGNITION: 09/20/2022 Overall cognitive status: Within functional limits for tasks assessed     SENSATION: 09/20/2022 [facility]  MUSCLE LENGTH: 09/20/2022 Eval:  Maisie Fus test: Left very tight   PALPATION: 09/20/2022 Eval: TTP in left quads  LOWER EXTREMITY ROM:  Active ROM Right 09/20/2022 Left 09/20/2022 Left 10/21/22  Hip flexion     Hip extension     Hip abduction     Hip adduction     Hip internal rotation     Hip external rotation     Knee flexion 135 125 129  Knee extension  0   Ankle dorsiflexion     Ankle plantarflexion     Ankle inversion     Ankle eversion      (Blank rows = not tested)  LOWER EXTREMITY MMT:  MMT Right 09/20/2022 Left 09/20/2022 Left 09/29/2022 Left 10/21/22 Left 11/10/2022 Left 11/16/2022  Hip flexion  3 4/5 4/5  5/5  Hip extension        Hip abduction  3+      Hip adduction        Hip internal rotation        Hip external rotation        Knee flexion 5 3+ 5/5 5/5 5/5   Knee extension 5 3+ 4/5 4+/5 5/5   Ankle dorsiflexion   5/5     Ankle plantarflexion        Ankle inversion        Ankle eversion         (Blank rows = not tested)  FUNCTIONAL TESTS:  10/05/2022:  Able to perform without UE assist sit to stand  09/20/2022 Eval: Unable to sit to stand without UE support  GAIT: 11/23/2022: Capable of independent ambulation.   10/27/2022:  Able to perform independent gait within clinic.   09/20/2022  Eval: Distance walked: 75 feet X 2 with bilateral crutches step to pattern, crutches appear to be too short for her but cannot be lengthened any more.                     TODAY'S TREATMENT:                                                                               DATE:  11/23/2022 Therex: UBE LE only lvl 2.5 10 mins  Leg press single 2 x 15 50 lbs  performed bilaterally Leg extension machine double leg up, single leg lowering 2 x 10 bilaterally 10 lbs  Knee flexion machine single leg 2 x 10 bilateral 10 lbs   TherActivity Step on over and down WB on Rt leg 4 inch step 2 x 10  Lateral step down 6 inch step x 20 slow lowering focus, performed bilaterally with light hand assist on bar.     TODAY'S TREATMENT:  DATE:  11/16/2022 Therex: UBE LE only lvl 2.5 10 mins  Leg press double leg 106 lbs x 15, single 2 x 15 50 lbs  performed bilaterally Lateral step down 4 inch step with foam pad  x15, performed bilaterally   Neuro Re-ed 9 inch hurdle step to pattern 10 ft x 5 leading with each leg in // bars, minimal HHA 9 inch lateral step over 10 ft x 3 each way in // bars, minimal HHA Vector stepping fwd, lateral, reverse with SLS focus x 8 each way, performed bilaterally - focus on light touch down with reach leg  TODAY'S TREATMENT:                                                                               DATE:  11/10/2022 Therex: UBE LE only lvl 2.5 10 mins  Seated knee extension machine double leg up single leg lowering 2 x 15 10 lbs, performed for Lt  Knee flexion machine Lt leg only 3 x 10 10 lbs  Forward step up WB on Lt leg 6 inch x 15 Lateral step down WB on Lt leg 6 inch x15  Neuro Re-ed Tandem ambulation fwd/back on foam 6 ft x 6 each way in // bars with occasional HHA Vector stepping fwd, lateral, reverse with SLS focus x 8 each way, performed bilaterally - focus on light touch down with reach leg   PATIENT EDUCATION: 10/19/2022 Education details: HEP update Person educated: Patient Education method: Programmer, multimedia, Demonstration, Verbal cues, and Handouts Education comprehension: verbalized understanding and needs further education   HOME EXERCISE PROGRAM: Access Code: [email or web address] URL: [email or web address] Date: 10/19/2022 Prepared by: Chyrel Masson  Exercises - Seated Knee Extension with Resistance  - 2 x daily - 6 x weekly - 1-3 sets - 10 reps - Hip Abduction with Resistance Loop  - 1 x daily - 4-5 x weekly - 1-3 sets - 10 reps - Standing Hip Flexion with Resistance Loop  - 1 x daily - 4-5 x weekly - 1-3 sets - 10 reps - Side to Side Weight Shift with Counter  Support  - 1 x daily - 4-5 x weekly - 1-3 sets - 10 reps - Seated Straight Leg Heel Taps  - 1-2 x daily - 7 x weekly - 3 sets - 10 reps - Sit to Stand  - 3 x daily - 7 x weekly - 1 sets - 10 reps - Supine Figure 4 Piriformis Stretch  - 2 x daily - 7 x weekly - 1 sets - 5 reps - 15 hold - Supine Piriformis Stretch with Foot on Ground  - 2 x daily - 7 x weekly - 1 sets - 5 reps - 15 hold  ASSESSMENT:  CLINICAL IMPRESSION:  No complaints of pain in visit.  Good knowledge of HEP.  Continued carrying of SPC but variable actual use during clinic walking.  Pt has demonstrated progression in ability to this point and showed appropriate for trial of HEP for continued gains.  May return if necessary prior to MD visit in approx. 30 days. Pt in agreement with plan.   OBJECTIVE IMPAIRMENTS: decreased activity tolerance, difficulty walking, decreased balance, decreased endurance, decreased mobility, decreased  ROM, decreased strength, impaired flexibility, impaired UE/LE use, postural dysfunction, and pain.  ACTIVITY LIMITATIONS: bending, lifting, carry, locomotion, cleaning, community activity, driving, and or occupation  PERSONAL FACTORS: anx, htn, S/P left femur intramedullary nail on 08/10/2022  are also affecting patient's functional outcome.  REHAB POTENTIAL: Good  CLINICAL DECISION MAKING: Stable/uncomplicated  EVALUATION COMPLEXITY: Low    GOALS: Short term PT Goals Target date: 10/18/2022   Pt will be I and compliant with HEP. Baseline:  Goal status: MET 10/12/22 Pt will decrease pain by 25% overall Baseline: Goal status: MET 10/12/22  Long term PT goals Target date:11/01/2022   Pt will improve left knee AROM >130 deg to improve functional mobility Baseline: Goal status: mostly met 11/16/2022  Pt will improve  hip/knee strength to at least 4+/5 MMT to improve functional strength Baseline: Goal status: Met 11/16/2022  Pt will improve FOTO to the predicted value to show improved  function Baseline: Goal status:MET 10/21/22  Pt will reduce pain to overall less than 3/10 with usual activity and work activity. Baseline: Goal status: mostly met 11/16/2022  Pt will be able to ambulate community distances at least 500 ft [facility] gait pattern and LRAD without complaints Baseline: Goal status: on going 11/16/2022  PLAN: PT FREQUENCY: 2-3 times per week   PT DURATION: 4-6 weeks  PLANNED INTERVENTIONS (unless contraindicated): aquatic PT, Canalith repositioning, cryotherapy, Electrical stimulation, Iontophoresis with 4 mg/ml dexamethasome, Moist heat, traction, Ultrasound, gait training, Therapeutic exercise, balance training, neuromuscular re-education, patient/family education, prosthetic training, manual techniques, passive ROM, dry needling, taping, vasopnuematic device, vestibular, spinal manipulations, joint manipulations  PLAN FOR NEXT SESSION:  Trial HEP period.   Chyrel Masson, PT, DPT, OCS, ATC 11/23/22  3:45 PM

## 2022-11-24 ENCOUNTER — Other Ambulatory Visit: Payer: Self-pay | Admitting: Internal Medicine

## 2022-11-24 DIAGNOSIS — J302 Other seasonal allergic rhinitis: Secondary | ICD-10-CM

## 2022-11-24 DIAGNOSIS — E782 Mixed hyperlipidemia: Secondary | ICD-10-CM

## 2022-11-24 DIAGNOSIS — K219 Gastro-esophageal reflux disease without esophagitis: Secondary | ICD-10-CM

## 2022-11-24 DIAGNOSIS — G4700 Insomnia, unspecified: Secondary | ICD-10-CM

## 2022-11-25 ENCOUNTER — Encounter: Payer: 59 | Admitting: Physical Therapy

## 2022-11-25 MED ORDER — SERTRALINE HCL 50 MG PO TABS
50.0000 mg | ORAL_TABLET | Freq: Every day | ORAL | 0 refills | Status: DC
Start: 2022-11-25 — End: 2022-12-15

## 2022-11-25 MED ORDER — ALENDRONATE SODIUM 70 MG PO TABS: ORAL_TABLET | ORAL | 0 refills | Status: DC

## 2022-11-25 MED ORDER — ROSUVASTATIN CALCIUM 20 MG PO TABS
20.0000 mg | ORAL_TABLET | Freq: Every day | ORAL | 0 refills | Status: DC
Start: 2022-11-25 — End: 2022-12-15

## 2022-11-25 MED ORDER — AMLODIPINE-OLMESARTAN 10-20 MG PO TABS: 1.0000 | ORAL_TABLET | Freq: Every day | ORAL | 0 refills | Status: DC

## 2022-11-25 MED ORDER — DESLORATADINE 5 MG PO TABS
5.0000 mg | ORAL_TABLET | Freq: Every day | ORAL | 0 refills | Status: AC
Start: 2022-11-25 — End: ?

## 2022-11-25 MED ORDER — FLUNISOLIDE 25 MCG/ACT (0.025%) NA SOLN: 2.0000 | Freq: Two times a day (BID) | NASAL | 0 refills | Status: DC

## 2022-11-25 MED ORDER — RABEPRAZOLE SODIUM 20 MG PO TBEC
20.0000 mg | DELAYED_RELEASE_TABLET | Freq: Every day | ORAL | 0 refills | Status: DC
Start: 2022-11-25 — End: 2022-12-15

## 2022-11-26 ENCOUNTER — Ambulatory Visit: Payer: 59 | Admitting: Internal Medicine

## 2022-12-09 ENCOUNTER — Encounter: Payer: Self-pay | Admitting: Rehabilitative and Restorative Service Providers"

## 2022-12-09 ENCOUNTER — Ambulatory Visit: Payer: 59 | Admitting: Rehabilitative and Restorative Service Providers"

## 2022-12-09 DIAGNOSIS — M6281 Muscle weakness (generalized): Secondary | ICD-10-CM | POA: Diagnosis not present

## 2022-12-09 DIAGNOSIS — M79605 Pain in left leg: Secondary | ICD-10-CM | POA: Diagnosis not present

## 2022-12-09 DIAGNOSIS — R262 Difficulty in walking, not elsewhere classified: Secondary | ICD-10-CM | POA: Diagnosis not present

## 2022-12-09 NOTE — Therapy (Signed)
OUTPATIENT PHYSICAL THERAPY TREATMENT/ PROGRESS NOTE/ RECERT  Patient Name: Carol Maldonado MRN: [id number] DOB:[birth year], 63 y.o., female Today's Date: 12/09/2022   Progress Note Reporting Period 10/21/2022 to 12/09/2022  See note below for Objective Data and Assessment of Progress/Goals.   END OF SESSION:  PT End of Session - 12/09/22 1004     Visit Number 17    Number of Visits 20    Date for PT Re-Evaluation 01/06/23    Authorization Type U[employer]    Progress Note Due on Visit 20    PT Start Time 1005    PT Stop Time 1045    PT Time Calculation (min) 40 min    Activity Tolerance Patient tolerated treatment well    Behavior During Therapy [facility] for tasks assessed/performed              Past Medical History:  Diagnosis Date   Allergy    Phreesia 02/29/2020   Anxiety    Phreesia 02/29/2020   Hyperlipidemia    Phreesia 02/29/2020   Hypertension    Phreesia 02/29/2020   Insomnia    Osteoporosis    Phreesia 02/29/2020   Seasonal allergies    Past Surgical History:  Procedure Laterality Date   COLONOSCOPY  03/27/2011   Dr. Jena Gauss; entirely normal exam and recommended repeat in 10 years.   COLONOSCOPY WITH PROPOFOL N/A 08/28/2020   Procedure: COLONOSCOPY WITH PROPOFOL;  Surgeon: Corbin Ade, MD;  [facility];  Service: Endoscopy;  Laterality: N/A;  8:30am   FEMUR IM NAIL Left 08/10/2022   Procedure: LEFT FEMUR INTRAMEDULLARY (IM) NAIL;  Surgeon: Cammy Copa, MD;  Location: [facility] OR;  Service: Orthopedics;  Laterality: Left;   Patient Active Problem List   Diagnosis Date Noted   Closed nondisplaced subtrochanteric fracture of femur with delayed healing 08/29/2022   Thigh pain, musculoskeletal, left 06/10/2022   GAD (generalized anxiety disorder) 06/10/2022   Hyperkalemia 11/20/2021   Encounter for general adult medical examination with abnormal findings 11/09/2021   Osteoporosis 03/19/2021   GERD (gastroesophageal reflux disease) 03/19/2021   HLD  (hyperlipidemia) 03/19/2021   MDD (major depressive disorder), recurrent, in partial remission (HCC) 03/19/2021   Anemia 11/12/2020   Stage 3b chronic kidney disease (HCC) 08/05/2020   Colon cancer screening 06/23/2020   Insomnia, unspecified 03/03/2020   Essential hypertension, benign 03/03/2020   Seasonal allergic rhinitis 03/03/2020    PCP: Anabel Halon, MD   REFERRING PROVIDER: Cammy Copa, MD   REFERRING DIAG: [id number] (ICD-10-CM) - Stress fracture of femoral shaft, left, sequela   THERAPY DIAG:  Pain in left leg - Plan: PT plan of care cert/re-cert  Difficulty in walking, not elsewhere classified - Plan: PT plan of care cert/re-cert  Muscle weakness (generalized) - Plan: PT plan of care cert/re-cert  Rationale for Evaluation and Treatment: Rehabilitation  ONSET DATE: left femur intramedullary nail on 08/10/2022   SUBJECTIVE:   SUBJECTIVE STATEMENT: Pt indicated having some complaints with cold mornings.  Pt indicated feeling better a lot.   Pt indicated elevated when resting and that helps.  Pt indicated lifting and kneeling on leg is still troublesome.   PERTINENT HISTORY: s/p left femur fracture, no restrictions, WBAT, osteoporosis  PAIN:  NPRS scale: no pain upon arrival.    At worst in last 5 days 2-3/10 Pain location: Lt thigh Pain description: throbbing, shooting Aggravating factors: unsure Relieving factors: over the counter NSAIDs, sitting with leg reclined  PRECAUTIONS: None  WEIGHT BEARING RESTRICTIONS: No  FALLS:  Has patient fallen in last 6 months? 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  Intramedullary nail in good   alignment without any complicating features.  No change in position  compared with intraoperative radiographs.   PATIENT SURVEYS:  12/09/2022 : FOTO update:  59  10/21/22: FOTO improved to 47%  09/20/2022 Eval: FOTO functional intake 3%, goal is 47%   COGNITION: 09/20/2022 Overall cognitive status: Within functional limits for tasks assessed     SENSATION: 09/20/2022 [facility]  MUSCLE LENGTH: 09/20/2022 Eval: Maisie Fus test: Left very tight   PALPATION: 09/20/2022 Eval: TTP in left quads  LOWER EXTREMITY ROM:  Active ROM Right 09/20/2022 Left 09/20/2022 Left 10/21/22  Hip flexion     Hip extension     Hip abduction     Hip adduction     Hip internal rotation     Hip external rotation     Knee flexion 135 125 129  Knee extension  0   Ankle dorsiflexion     Ankle plantarflexion     Ankle inversion     Ankle eversion      (Blank rows = not tested)  LOWER EXTREMITY MMT:  MMT Right 09/20/2022 Left 09/20/2022 Left 09/29/2022 Left 10/21/22 Left 11/10/2022 Left 11/16/2022 Right 12/09/2022 Left 12/09/2022  Hip flexion  3 4/5 4/5  5/5    Hip extension          Hip abduction  3+        Hip adduction          Hip internal rotation          Hip external rotation          Knee flexion 5 3+ 5/5 5/5 5/5     Knee extension 5 3+ 4/5 4+/5 5/5  5/5 48, 45 lbs 5/5 35.3, 33 lbs  Ankle dorsiflexion   5/5       Ankle plantarflexion          Ankle inversion          Ankle eversion           (Blank rows = not tested)  FUNCTIONAL TESTS:  10/05/2022:  Able to perform without UE assist sit to stand  09/20/2022 Eval: Unable to sit to stand without UE support  GAIT: 12/09/2022: Capable of independent ambulation in clinic.  Pt continued to demonstrate/report use of SPC in community.   11/23/2022: Capable of independent ambulation.   10/27/2022:  Able to perform independent gait within clinic.   09/20/2022  Eval: Distance walked: 75 feet X 2 with bilateral crutches step to  pattern, crutches appear to be too short for her but cannot be lengthened any more.                     TODAY'S TREATMENT:  DATE:  12/09/2022 Therex: UBE LE only lvl 2.5 10 mins  Leg extension machine double leg up, single leg lowering 3 x 10 Rt leg 10 lbs   TherActivity Step on over and down WB on Rt leg 6 inch step x 20 with light hand rail assist  Lateral step down 6 inch step x 20 slow lowering focus, performed bilaterally with light hand assist on bar.   Neuro Re-ed SLS with vector reaching forward, lateral, reverse x 8 each direction, performed bilaterally.   TODAY'S TREATMENT:                                                                               DATE:  11/23/2022 Therex: UBE LE only lvl 2.5 10 mins  Leg press single 2 x 15 50 lbs  performed bilaterally Leg extension machine double leg up, single leg lowering 2 x 10 bilaterally 10 lbs  Knee flexion machine single leg 2 x 10 bilateral 10 lbs   TherActivity Step on over and down WB on Rt leg 4 inch step 2 x 10  Lateral step down 6 inch step x 20 slow lowering focus, performed bilaterally with light hand assist on bar.    TODAY'S TREATMENT:                                                                               DATE:  11/16/2022 Therex: UBE LE only lvl 2.5 10 mins  Leg press double leg 106 lbs x 15, single 2 x 15 50 lbs  performed bilaterally Lateral step down 4 inch step with foam pad  x15, performed bilaterally   Neuro Re-ed 9 inch hurdle step to pattern 10 ft x 5 leading with each leg in // bars, minimal HHA 9 inch lateral step over 10 ft x 3 each way in // bars, minimal HHA Vector stepping fwd, lateral, reverse with SLS focus x 8 each way, performed bilaterally - focus on light touch down with reach leg  TODAY'S TREATMENT:                                                                               DATE:  11/10/2022 Therex: UBE LE  only lvl 2.5 10 mins  Seated knee extension machine double leg up single leg lowering 2 x 15 10 lbs, performed for Lt  Knee flexion machine Lt leg only 3 x 10 10 lbs  Forward step up WB on Lt leg 6 inch x 15 Lateral step down WB on Lt leg 6 inch x15  Neuro Re-ed Tandem ambulation fwd/back on foam 6 ft  x 6 each way in // bars with occasional HHA Vector stepping fwd, lateral, reverse with SLS focus x 8 each way, performed bilaterally - focus on light touch down with reach leg   PATIENT EDUCATION: 10/19/2022 Education details: HEP update Person educated: Patient Education method: Programmer, multimedia, Demonstration, Verbal cues, and Handouts Education comprehension: verbalized understanding and needs further education   HOME EXERCISE PROGRAM: Access Code: [email or web address] URL: [email or web address] Date: 10/19/2022 Prepared by: Chyrel Masson  Exercises - Seated Knee Extension with Resistance  - 2 x daily - 6 x weekly - 1-3 sets - 10 reps - Hip Abduction with Resistance Loop  - 1 x daily - 4-5 x weekly - 1-3 sets - 10 reps - Standing Hip Flexion with Resistance Loop  - 1 x daily - 4-5 x weekly - 1-3 sets - 10 reps - Side to Side Weight Shift with Counter Support  - 1 x daily - 4-5 x weekly - 1-3 sets - 10 reps - Seated Straight Leg Heel Taps  - 1-2 x daily - 7 x weekly - 3 sets - 10 reps - Sit to Stand  - 3 x daily - 7 x weekly - 1 sets - 10 reps - Supine Figure 4 Piriformis Stretch  - 2 x daily - 7 x weekly - 1 sets - 5 reps - 15 hold - Supine Piriformis Stretch with Foot on Ground  - 2 x daily - 7 x weekly - 1 sets - 5 reps - 15 hold  ASSESSMENT:  CLINICAL IMPRESSION:  Reassessment of quad strength today showed improvement continued in Lt leg but still lacking compared to Rt.  Lack of strength can impact functional movements on stairs, walking, kneeling/squatting negatively.   Encouraged continued progression in walking exercise distance to improve distance for community integration.   Pt may benefit from continued skilled PT services with HEP with goals of transitioning towards independent HEP.   OBJECTIVE IMPAIRMENTS: decreased activity tolerance, difficulty walking, decreased balance, decreased endurance, decreased mobility, decreased ROM, decreased strength, impaired flexibility, impaired UE/LE use, postural dysfunction, and pain.  ACTIVITY LIMITATIONS: bending, lifting, carry, locomotion, cleaning, community activity, driving, and or occupation  PERSONAL FACTORS: anx, htn, S/P left femur intramedullary nail on 08/10/2022  are also affecting patient's functional outcome.  REHAB POTENTIAL: Good  CLINICAL DECISION MAKING: Stable/uncomplicated  EVALUATION COMPLEXITY: Low    GOALS: Short term PT Goals Target date: 10/18/2022   Pt will be I and compliant with HEP. Baseline:  Goal status: MET 10/12/22 Pt will decrease pain by 25% overall Baseline: Goal status: MET 10/12/22  Long term PT goals Target date: 01/06/2023  Pt will improve left knee AROM >130 deg to improve functional mobility Baseline: Goal status: revised for date   Pt will improve  hip/knee strength to at least 4+/5 MMT to improve functional strength Baseline: Goal status: Met 11/16/2022  Pt will improve FOTO to the predicted value to show improved function Baseline: Goal status:MET 10/21/22  Pt will reduce pain to overall less than 3/10 with usual activity and work activity. Baseline: Goal status: revised for date  Pt will be able to ambulate community distances at least 500 ft WFL gait pattern and LRAD without complaints Baseline: Goal status: revised for date  PLAN: PT FREQUENCY: 1x/week  PT DURATION: up to 4 weeks  PLANNED INTERVENTIONS (unless contraindicated): aquatic PT, Canalith repositioning, cryotherapy, Electrical stimulation, Iontophoresis with 4 mg/ml dexamethasome, Moist heat, traction, Ultrasound, gait training, Therapeutic exercise, balance training, neuromuscular  re-education, patient/family education, prosthetic training,  manual techniques, passive ROM, dry needling, taping, vasopnuematic device, vestibular, spinal manipulations, joint manipulations  PLAN FOR NEXT SESSION:  HEP next 2 weeks.  Check dynameter quad extension.  Possible discharge?  Chyrel Masson, PT, DPT, OCS, ATC 12/09/22  10:43 AM

## 2022-12-10 ENCOUNTER — Telehealth: Payer: Self-pay | Admitting: Orthopedic Surgery

## 2022-12-10 NOTE — Telephone Encounter (Signed)
Received call from patient requesting notes from 11/25/22-12/09/22 faxed to [employer]. I faxed [phone]

## 2022-12-14 ENCOUNTER — Encounter: Payer: 59 | Admitting: Rehabilitative and Restorative Service Providers"

## 2022-12-15 ENCOUNTER — Ambulatory Visit: Payer: 59 | Admitting: Internal Medicine

## 2022-12-15 ENCOUNTER — Encounter: Payer: Self-pay | Admitting: Internal Medicine

## 2022-12-15 VITALS — BP 138/84 | HR 103 | Ht 61.0 in | Wt 154.6 lb

## 2022-12-15 DIAGNOSIS — M84352S Stress fracture, left femur, sequela: Secondary | ICD-10-CM | POA: Diagnosis not present

## 2022-12-15 DIAGNOSIS — N1832 Chronic kidney disease, stage 3b: Secondary | ICD-10-CM

## 2022-12-15 DIAGNOSIS — E875 Hyperkalemia: Secondary | ICD-10-CM

## 2022-12-15 DIAGNOSIS — E782 Mixed hyperlipidemia: Secondary | ICD-10-CM

## 2022-12-15 DIAGNOSIS — N2581 Secondary hyperparathyroidism of renal origin: Secondary | ICD-10-CM | POA: Insufficient documentation

## 2022-12-15 DIAGNOSIS — M8080XS Other osteoporosis with current pathological fracture, unspecified site, sequela: Secondary | ICD-10-CM

## 2022-12-15 DIAGNOSIS — Z23 Encounter for immunization: Secondary | ICD-10-CM

## 2022-12-15 DIAGNOSIS — E559 Vitamin D deficiency, unspecified: Secondary | ICD-10-CM

## 2022-12-15 DIAGNOSIS — K219 Gastro-esophageal reflux disease without esophagitis: Secondary | ICD-10-CM

## 2022-12-15 DIAGNOSIS — M84352A Stress fracture, left femur, initial encounter for fracture: Secondary | ICD-10-CM | POA: Insufficient documentation

## 2022-12-15 DIAGNOSIS — I10 Essential (primary) hypertension: Secondary | ICD-10-CM | POA: Diagnosis not present

## 2022-12-15 DIAGNOSIS — G4700 Insomnia, unspecified: Secondary | ICD-10-CM

## 2022-12-15 DIAGNOSIS — J302 Other seasonal allergic rhinitis: Secondary | ICD-10-CM

## 2022-12-15 DIAGNOSIS — F3341 Major depressive disorder, recurrent, in partial remission: Secondary | ICD-10-CM

## 2022-12-15 DIAGNOSIS — J4520 Mild intermittent asthma, uncomplicated: Secondary | ICD-10-CM

## 2022-12-15 DIAGNOSIS — J45909 Unspecified asthma, uncomplicated: Secondary | ICD-10-CM | POA: Insufficient documentation

## 2022-12-15 MED ORDER — ROSUVASTATIN CALCIUM 20 MG PO TABS
20.0000 mg | ORAL_TABLET | Freq: Every day | ORAL | 1 refills | Status: DC
Start: 2022-12-15 — End: 2023-06-22

## 2022-12-15 MED ORDER — FLUNISOLIDE 25 MCG/ACT (0.025%) NA SOLN
2.0000 | Freq: Two times a day (BID) | NASAL | 5 refills | Status: DC
Start: 2022-12-15 — End: 2023-06-23

## 2022-12-15 MED ORDER — SERTRALINE HCL 50 MG PO TABS
50.0000 mg | ORAL_TABLET | Freq: Every day | ORAL | 1 refills | Status: DC
Start: 2022-12-15 — End: 2023-06-22

## 2022-12-15 MED ORDER — ALBUTEROL SULFATE HFA 108 (90 BASE) MCG/ACT IN AERS
2.0000 | INHALATION_SPRAY | Freq: Four times a day (QID) | RESPIRATORY_TRACT | 3 refills | Status: DC | PRN
Start: 2022-12-15 — End: 2023-09-26

## 2022-12-15 MED ORDER — RABEPRAZOLE SODIUM 20 MG PO TBEC
20.0000 mg | DELAYED_RELEASE_TABLET | Freq: Every day | ORAL | 1 refills | Status: DC
Start: 2022-12-15 — End: 2023-06-22

## 2022-12-15 MED ORDER — AZELASTINE HCL 0.1 % NA SOLN
1.0000 | Freq: Two times a day (BID) | NASAL | 3 refills | Status: DC
Start: 2022-12-15 — End: 2023-04-26

## 2022-12-15 MED ORDER — RAYALDEE 30 MCG PO CPCR
30.0000 ug | ORAL_CAPSULE | ORAL | 5 refills | Status: DC
Start: 2022-12-16 — End: 2023-10-31

## 2022-12-15 NOTE — Assessment & Plan Note (Signed)
Takes Xanax 0.5 mg nightly as needed

## 2022-12-15 NOTE — Assessment & Plan Note (Signed)
On statin.

## 2022-12-15 NOTE — Patient Instructions (Signed)
Please call [phone]) for Rayaldee coupon assistance.  Please continue to take medications as prescribed.  Please continue to follow low salt diet and ambulate as tolerated.

## 2022-12-15 NOTE — Assessment & Plan Note (Signed)
Likely due to potassium rich food intake Advised to follow low salt, low potassium diet

## 2022-12-15 NOTE — Assessment & Plan Note (Signed)
Status post left femur intramedullary nail placement Followed by orthopedic surgery and PT Was told that it was due to Fosamax use although she has used it less than 5 years Dilaudid as needed for severe pain

## 2022-12-15 NOTE — Assessment & Plan Note (Signed)
Last BMP reviewed, stable GFR now, around baseline Avoid nephrotoxic agents On ARB Followed by nephrology

## 2022-12-15 NOTE — Assessment & Plan Note (Signed)
Lab Results  Component Value Date   PTH 91 (H) 12/09/2022   CALCIUM 9.5 12/09/2022   Likely due to CKD On Rayaldee for Vitamin D deficiency, has been out of it due to shortage at her pharmacy, sent to another pharmacy as per patient request

## 2022-12-15 NOTE — Assessment & Plan Note (Signed)
Well controlled with Clarinex Uses azelastine and Nasalide nasal spray

## 2022-12-15 NOTE — Assessment & Plan Note (Signed)
Well controlled with albuterol as needed ?

## 2022-12-15 NOTE — Assessment & Plan Note (Signed)
Well-controlled with Zoloft

## 2022-12-15 NOTE — Assessment & Plan Note (Signed)
Was on alendronate since 2020, caused stress fracture (?), DC alendronate Considering recent stress fracture, would avoid getting DEXA scan currently - plan to get DEXA scan in the next visit and starting Prolia

## 2022-12-15 NOTE — Progress Notes (Signed)
Established Patient Office Visit  Subjective:  Patient ID: Carol Maldonado, female    DOB: Sep 08, 1960  Age: 62 y.o. MRN: [id number]  CC:  Chief Complaint  Patient presents with   Hypertension    Follow up     HPI Carol Maldonado is a 62 y.o. female with past medical history of HTN, GERD, osteoporosis, CKD stage IIIb, depression with insomnia and HLD who presents for f/u of her chronic medical conditions.  HTN: BP is well-controlled. Takes medications regularly. Patient denies headache, dizziness, chest pain, dyspnea or palpitations.  CKD stage 3b: BMP reviewed from chart, GFR at 36, stable compared to prior.  She has mild hypokalemia, put potassium at 5.4.  She reports that she ate tomato sandwich, nectarine and potatoes recently, which could be contributing to hyperkalemia.  She agrees to cut down potassium rich food. She denies any dysuria, hematuria, urinary hesitance or resistance.  She is seeing Nephrology.  She is on ARB and vitamin D supplements currently.  Stress fracture of femur: She had stress fracture of femur, s/p left femur intramedullary nail placement on 08/10/22.  She was told that it was due to Fosamax use (2020-2024).  She has stopped taking Fosamax for osteoporosis. She has also stopped OTC calcium and vitamin D supplement.  She takes Rayaldee for vitamin D deficiency and CKD.  She is undergoing PT currently.  She still has left hip pain, which is slowly improving.  She has Dilaudid as needed for severe pain and gabapentin for neuropathic pain.  She prefers to take Tylenol instead for hip pain.  She takes Rabeprazole for GERD. Denies any dysphagia or odynophagia.  She has been taking Zoloft for depression and takes Xanax at bedtime for insomnia.  Denies any anhedonia, SI or HI.  Past Medical History:  Diagnosis Date   Allergy    Phreesia 02/29/2020   Anxiety    [employer]reesia 02/29/2020   Hyperlipidemia    [employer]reesia 02/29/2020   Hypertension    [employer]reesia 02/29/2020    Insomnia    Osteoporosis    [employer]reesia 02/29/2020   Seasonal allergies     Past Surgical History:  Procedure Laterality Date   COLONOSCOPY  03/27/2011   Dr. Jena Gauss; entirely normal exam and recommended repeat in 10 years.   COLONOSCOPY WITH PROPOFOL N/A 08/28/2020   Procedure: COLONOSCOPY WITH PROPOFOL;  Surgeon: Corbin Ade, MD;  Location: [facility];  Service: Endoscopy;  Laterality: N/A;  8:30am   FEMUR IM NAIL Left 08/10/2022   Procedure: LEFT FEMUR INTRAMEDULLARY (IM) NAIL;  Surgeon: Cammy Copa, MD;  Location: [facility];  Service: Orthopedics;  Laterality: Left;    Family History  Problem Relation Age of Onset   Hypertension Mother    Heart attack Mother    Pneumonia Maternal Grandmother    Other Maternal Grandfather        MVA   Hypertension Father    High Cholesterol Father    Irritable bowel syndrome Sister    Irritable bowel syndrome Sister    Colon cancer Neg Hx     Social History   Socioeconomic History   Marital status: Single    Spouse name: Not on file   Number of children: Not on file   Years of education: Not on file   Highest education level: Not on file  Occupational History   Not on file  Tobacco Use   Smoking status: Never   Smokeless tobacco: Never  Vaping Use   Vaping status:  Never Used  Substance and Sexual Activity   Alcohol use: Never    Alcohol/week: 0.0 standard drinks of alcohol   Drug use: Never   Sexual activity: Not Currently    Birth control/protection: Post-menopausal  Other Topics Concern   Not on file  Social History Narrative   Not on file   Social Determinants of Health   Financial Resource Strain: Low Risk  (03/04/2022)   Overall Financial Resource Strain (CARDIA)    Difficulty of Paying Living Expenses: Not hard at all  Food Insecurity: No Food Insecurity (03/04/2022)   Hunger Vital Sign    Worried About Running Out of Food in the Last Year: Never true    Ran Out of Food in the Last Year: Never true   Transportation Needs: No Transportation Needs (03/04/2022)   PRAPARE - Administrator, Civil Service (Medical): No    Lack of Transportation (Non-Medical): No  Physical Activity: Insufficiently Active (03/04/2022)   Exercise Vital Sign    Days of Exercise per Week: 5 days    Minutes of Exercise per Session: 10 min  Stress: No Stress Concern Present (03/04/2022)   [employer] of Occupational Health - Occupational Stress Questionnaire    Feeling of Stress : Not at all  Social Connections: Moderately Integrated (03/04/2022)   Social Connection and Isolation Panel [NHANES]    Frequency of Communication with Friends and Family: More than three times a week    Frequency of Social Gatherings with Friends and Family: Once a week    Attends Religious Services: More than 4 times per year    Active Member of [employer] or Organizations: Yes    Attends Banker Meetings: More than 4 times per year    Marital Status: Never married  Intimate Partner Violence: Not At Risk (03/04/2022)   Humiliation, Afraid, Rape, and Kick questionnaire    Fear of Current or Ex-Partner: No    Emotionally Abused: No    Physically Abused: No    Sexually Abused: No    Outpatient Medications Prior to Visit  Medication Sig Dispense Refill   ALPRAZolam (XANAX) 0.5 MG tablet TAKE 1 TABLET BY MOUTH AT BEDTIME. 30 tablet 3   amlodipine-olmesartan (AZOR) 10-20 MG tablet Take 1 tablet by mouth daily. 90 tablet 0   desloratadine (CLARINEX) 5 MG tablet Take 1 tablet (5 mg total) by mouth daily. 30 tablet 0   gabapentin (NEURONTIN) 300 MG capsule Take 1 capsule (300 mg total) by mouth 2 (two) times daily. 30 capsule 0   HYDROmorphone (DILAUDID) 2 MG tablet Take 0.5 tablets (1 mg total) by mouth every 8 (eight) hours as needed for severe pain. 15 tablet 0   Naphazoline-Pheniramine (OPCON-A) 0.027-0.315 % SOLN Place 1 drop into both eyes 3 (three) times daily as needed (allergy eyes.).     Omega-3 Fatty Acids  (FISH OIL) 1000 MG CAPS Take 1,000 mg by mouth in the morning.     albuterol (VENTOLIN HFA) 108 (90 Base) MCG/ACT inhaler Inhale 2 puffs into the lungs as needed. (Patient taking differently: Inhale 2 puffs into the lungs every 6 (six) hours as needed for wheezing or shortness of breath.) 6.7 g 3   alendronate (FOSAMAX) 70 MG tablet Take 1 tablet (70 mg total) by mouth once a week. Take with a full glass of water on an empty stomach. (Patient not taking: Reported on 12/15/2022) 4 tablet 0   alendronate (FOSAMAX) 70 MG tablet Take with a full glass of water  on an empty stomach.take 1 tablet (70 MILLIGRAM total) by mouth once a week. take with a full glass of water on an empty stomach. (Patient not taking: Reported on 12/15/2022) 4 tablet 0   azelastine (ASTELIN) 0.1 % nasal spray Place 1 spray into both nostrils 2 (two) times daily. Use in each nostril as directed (Patient taking differently: Place 2 sprays into both nostrils in the morning. Use in each nostril as directed) 30 mL 1   Calcium Carb-Cholecalciferol (CALCIUM 600/VITAMIN D3 PO) Take 1 tablet by mouth in the morning.     cholecalciferol (VITAMIN D3) 25 MCG (1000 UNIT) tablet Take 1,000 Units by mouth in the morning.     cyclobenzaprine (FLEXERIL) 5 MG tablet Take 1 tablet (5 mg total) by mouth 2 (two) times daily as needed for muscle spasms. 30 tablet 1   flunisolide (NASALIDE) 25 MCG/ACT (0.025%) SOLN Place 2 sprays into the nose 2 (two) times daily. 25 mL 0   flunisolide (NASALIDE) 25 MCG/ACT (0.025%) SOLN Place 2 sprays into the nose 2 (two) times daily. 25 mL 0   ibuprofen (ADVIL) 800 MG tablet Take 1 tablet (800 mg total) by mouth every 8 (eight) hours as needed. 30 tablet 0   predniSONE (STERAPRED UNI-PAK 21 TAB) 10 MG (21) TBPK tablet Take as package instructions. (Patient not taking: Reported on 08/06/2022) 1 each 0   RABEprazole (ACIPHEX) 20 MG tablet Take 1 tablet (20 mg total) by mouth daily. 30 tablet 0   RAYALDEE 30 MCG CPCR Take 1  capsule (30 mcg total) by mouth 2 (two) times a week. Take 1 capsule (30 mcg) by mouth on Mondays & Fridays in the evening. 30 capsule 0   rosuvastatin (CRESTOR) 20 MG tablet Take 1 tablet (20 mg total) by mouth daily. 30 tablet 0   sertraline (ZOLOFT) 50 MG tablet Take 1 tablet (50 mg total) by mouth daily. 30 tablet 0   No facility-administered medications prior to visit.    Allergies  Allergen Reactions   Amoxicillin Hives, Itching and Rash   Codeine Hives, Itching and Rash   Hydrocodone Rash   Keflex [Cephalexin] Rash   Penicillins Rash   Sulfa Antibiotics Rash    ROS Review of Systems  Constitutional:  Negative for chills and fever.  HENT:  Negative for congestion, sinus pressure, sinus pain and sore throat.   Eyes:  Negative for pain and discharge.  Respiratory:  Negative for cough and shortness of breath.   Cardiovascular:  Negative for chest pain and palpitations.  Gastrointestinal:  Negative for abdominal pain, diarrhea, nausea and vomiting.  Endocrine: Negative for polydipsia and polyuria.  Genitourinary:  Negative for dysuria and hematuria.  Musculoskeletal:  Positive for arthralgias (Left hip). Negative for neck pain and neck stiffness.  Skin:  Negative for rash.  Neurological:  Negative for dizziness and weakness.  Psychiatric/Behavioral:  Positive for sleep disturbance. Negative for agitation and behavioral problems. The patient is nervous/anxious.       Objective:    Physical Exam Vitals reviewed.  Constitutional:      General: She is not in acute distress.    Appearance: She is not diaphoretic.     Comments: Has a cane for walking support  HENT:     Head: Normocephalic and atraumatic.     Nose: Nose normal. No congestion.     Mouth/Throat:     Mouth: Mucous membranes are moist.     Pharynx: No posterior oropharyngeal erythema.  Eyes:  General: No scleral icterus.    Extraocular Movements: Extraocular movements intact.  Cardiovascular:     Rate  and Rhythm: Normal rate and regular rhythm.     Pulses: Normal pulses.     Heart sounds: Normal heart sounds. No murmur heard. Pulmonary:     Breath sounds: Normal breath sounds. No wheezing or rales.  Musculoskeletal:     Cervical back: Neck supple. No tenderness.     Right hip: Decreased range of motion.     Left hip: Tenderness present.     Left knee: Normal range of motion. No tenderness.     Right lower leg: No edema.     Left lower leg: No edema.  Skin:    General: Skin is warm.     Findings: No rash.  Neurological:     General: No focal deficit present.     Mental Status: She is alert and oriented to person, place, and time.     Sensory: No sensory deficit.     Motor: No weakness.  Psychiatric:        Mood and Affect: Mood normal.        Behavior: Behavior normal.     BP 138/84 (BP Location: Left Arm)   Pulse (!) 103   Ht 5\' 1"  (1.549 m)   Wt 154 lb 9.6 oz (70.1 kg)   SpO2 97%   BMI 29.21 kg/m  Wt Readings from Last 3 Encounters:  12/15/22 154 lb 9.6 oz (70.1 kg)  08/10/22 150 lb (68 kg)  06/10/22 150 lb 9.6 oz (68.3 kg)    Lab Results  Component Value Date   TSH 1.490 11/09/2021   Lab Results  Component Value Date   WBC 6.8 08/10/2022   HGB 10.7 (L) 08/10/2022   HCT 34.3 (L) 08/10/2022   MCV 84.9 08/10/2022   PLT 185 08/10/2022   Lab Results  Component Value Date   NA 138 12/09/2022   K 5.4 (H) 12/09/2022   CO2 19 (L) 12/09/2022   GLUCOSE 92 12/09/2022   BUN 24 12/09/2022   CREATININE 1.58 (H) 12/09/2022   BILITOT 0.2 12/09/2022   ALKPHOS 112 12/09/2022   AST 18 12/09/2022   ALT 8 12/09/2022   PROT 7.6 12/09/2022   ALBUMIN 5.0 (H) 12/09/2022   CALCIUM 9.5 12/09/2022   ANIONGAP 8 08/10/2022   EGFR 36 (L) 12/09/2022   Lab Results  Component Value Date   CHOL 121 11/09/2021   Lab Results  Component Value Date   HDL 58 11/09/2021   Lab Results  Component Value Date   LDLCALC 42 11/09/2021   Lab Results  Component Value Date   TRIG  116 11/09/2021   Lab Results  Component Value Date   CHOLHDL 2.1 11/09/2021   Lab Results  Component Value Date   HGBA1C 6.2 (H) 06/03/2022      Assessment & Plan:   Problem List Items Addressed This Visit       Cardiovascular and Mediastinum   Essential hypertension, benign    BP Readings from Last 1 Encounters:  12/15/22 138/84   Well-controlled with Amlodipine-Olmesartan 10-20 mg QD now Counseled for compliance with the medications Advised DASH diet and moderate exercise/walking, at least 150 mins/week      Relevant Medications   rosuvastatin (CRESTOR) 20 MG tablet     Respiratory   Seasonal allergic rhinitis    Well controlled with Clarinex Uses azelastine and Nasalide nasal spray      Relevant Medications  azelastine (ASTELIN) 0.1 % nasal spray   flunisolide (NASALIDE) 25 MCG/ACT (0.025%) SOLN   Reactive airway disease    Well controlled with albuterol as needed      Relevant Medications   albuterol (VENTOLIN HFA) 108 (90 Base) MCG/ACT inhaler     Digestive   GERD (gastroesophageal reflux disease)    Well-controlled with Rabeprazole Advised to try Pepcid instead of rabeprazole to reduce worsening of osteoporosis      Relevant Medications   RABEprazole (ACIPHEX) 20 MG tablet     Endocrine   Secondary hyperparathyroidism of renal origin [facility])    Lab Results  Component Value Date   PTH 91 (H) 12/09/2022   CALCIUM 9.5 12/09/2022   Likely due to CKD On Rayaldee for Vitamin D deficiency, has been out of it due to shortage at her pharmacy, sent to another pharmacy as per patient request        Musculoskeletal and Integument   Osteoporosis    Was on alendronate since 2020, caused stress fracture (?), DC alendronate Considering recent stress fracture, would avoid getting DEXA scan currently - plan to get DEXA scan in the next visit and starting Prolia      Stress fracture of left femur    Status post left femur intramedullary nail  placement Followed by orthopedic surgery and PT Was told that it was due to Fosamax use although she has used it less than 5 years Dilaudid as needed for severe pain        Genitourinary   Stage 3b chronic kidney disease (HCC) - Primary    Last BMP reviewed, stable GFR now, around baseline Avoid nephrotoxic agents On ARB Followed by nephrology      Relevant Medications   RAYALDEE 30 MCG CPCR (Start on 12/16/2022)     Other   Insomnia, unspecified    Takes Xanax 0.5 mg nightly as needed      Relevant Medications   sertraline (ZOLOFT) 50 MG tablet   HLD (hyperlipidemia)    On statin      Relevant Medications   rosuvastatin (CRESTOR) 20 MG tablet   MDD (major depressive disorder), recurrent, in partial remission (HCC)    Well controlled with Zoloft      Relevant Medications   sertraline (ZOLOFT) 50 MG tablet   Hyperkalemia    Likely due to potassium rich food intake Advised to follow low salt, low potassium diet       Other Visit Diagnoses     Vitamin D deficiency       Relevant Medications   RAYALDEE 30 MCG CPCR (Start on 12/16/2022)   Encounter for immunization       Relevant Orders   Flu vaccine trivalent PF, 6mos and older(Flulaval,Afluria,Fluarix,Fluzone) (Completed)       Meds ordered this encounter  Medications   azelastine (ASTELIN) 0.1 % nasal spray    Sig: Place 1 spray into both nostrils 2 (two) times daily. Use in each nostril as directed    Dispense:  30 mL    Refill:  3   albuterol (VENTOLIN HFA) 108 (90 Base) MCG/ACT inhaler    Sig: Inhale 2 puffs into the lungs every 6 (six) hours as needed.    Dispense:  18 g    Refill:  3   RAYALDEE 30 MCG CPCR    Sig: Take 1 capsule (30 mcg total) by mouth 2 (two) times a week. Take 1 capsule (30 mcg) by mouth on Mondays & Fridays in the evening.  Dispense:  8 capsule    Refill:  5   sertraline (ZOLOFT) 50 MG tablet    Sig: Take 1 tablet (50 mg total) by mouth daily.    Dispense:  90 tablet     Refill:  1   rosuvastatin (CRESTOR) 20 MG tablet    Sig: Take 1 tablet (20 mg total) by mouth daily.    Dispense:  90 tablet    Refill:  1   RABEprazole (ACIPHEX) 20 MG tablet    Sig: Take 1 tablet (20 mg total) by mouth daily.    Dispense:  90 tablet    Refill:  1   flunisolide (NASALIDE) 25 MCG/ACT (0.025%) SOLN    Sig: Place 2 sprays into the nose 2 (two) times daily.    Dispense:  25 mL    Refill:  5    Follow-up: Return in about 4 months (around 04/16/2023) for Annual physical.    Anabel Halon, MD

## 2022-12-15 NOTE — Assessment & Plan Note (Signed)
Well-controlled with Rabeprazole Advised to try Pepcid instead of rabeprazole to reduce worsening of osteoporosis

## 2022-12-15 NOTE — Assessment & Plan Note (Signed)
BP Readings from Last 1 Encounters:  12/15/22 138/84   Well-controlled with Amlodipine-Olmesartan 10-20 mg QD now Counseled for compliance with the medications Advised DASH diet and moderate exercise/walking, at least 150 mins/week

## 2022-12-20 ENCOUNTER — Ambulatory Visit: Payer: 59 | Admitting: Physical Therapy

## 2022-12-20 ENCOUNTER — Encounter: Payer: Self-pay | Admitting: Physical Therapy

## 2022-12-20 DIAGNOSIS — R262 Difficulty in walking, not elsewhere classified: Secondary | ICD-10-CM

## 2022-12-20 DIAGNOSIS — M79605 Pain in left leg: Secondary | ICD-10-CM | POA: Diagnosis not present

## 2022-12-20 DIAGNOSIS — M6281 Muscle weakness (generalized): Secondary | ICD-10-CM | POA: Diagnosis not present

## 2022-12-20 NOTE — Therapy (Addendum)
OUTPATIENT PHYSICAL THERAPY TREATMENT  / DISCHARGE  Patient Name: Carol Maldonado MRN: [id number] DOB:1961-03-07, 62 y.o., female Today's Date: 12/20/2022     END OF SESSION:  PT End of Session - 12/20/22 1602     Visit Number 18    Number of Visits 20    Date for PT Re-Evaluation 01/06/23    Authorization Type [employer]    Progress Note Due on Visit 20    PT Start Time 1600    PT Stop Time 1640    PT Time Calculation (min) 40 min    Activity Tolerance Patient tolerated treatment well    Behavior During Therapy [facility] for tasks assessed/performed              Past Medical History:  Diagnosis Date   Allergy    Phreesia 02/29/2020   Anxiety    Phreesia 02/29/2020   Hyperlipidemia    Phreesia 02/29/2020   Hypertension    Phreesia 02/29/2020   Insomnia    Osteoporosis    Phreesia 02/29/2020   Seasonal allergies    Past Surgical History:  Procedure Laterality Date   COLONOSCOPY  03/27/2011   Dr. Jena Gauss; entirely normal exam and recommended repeat in 10 years.   COLONOSCOPY WITH PROPOFOL N/A 08/28/2020   Procedure: COLONOSCOPY WITH PROPOFOL;  Surgeon: Corbin Ade, MD;  [facility];  Service: Endoscopy;  Laterality: N/A;  8:30am   FEMUR IM NAIL Left 08/10/2022   Procedure: LEFT FEMUR INTRAMEDULLARY (IM) NAIL;  Surgeon: Cammy Copa, MD;  Location: [facility] OR;  Service: Orthopedics;  Laterality: Left;   Patient Active Problem List   Diagnosis Date Noted   Secondary hyperparathyroidism of renal origin (HCC) 12/15/2022   Stress fracture of left femur 12/15/2022   Reactive airway disease 12/15/2022   Closed nondisplaced subtrochanteric fracture of femur with delayed healing 08/29/2022   GAD (generalized anxiety disorder) 06/10/2022   Hyperkalemia 11/20/2021   Encounter for general adult medical examination with abnormal findings 11/09/2021   Osteoporosis 03/19/2021   GERD (gastroesophageal reflux disease) 03/19/2021   HLD (hyperlipidemia) 03/19/2021   MDD  (major depressive disorder), recurrent, in partial remission (HCC) 03/19/2021   Anemia 11/12/2020   Stage 3b chronic kidney disease (HCC) 08/05/2020   Colon cancer screening 06/23/2020   Insomnia, unspecified 03/03/2020   Essential hypertension, benign 03/03/2020   Seasonal allergic rhinitis 03/03/2020    PCP: Anabel Halon, MD   REFERRING PROVIDER: Cammy Copa, MD   REFERRING DIAG: [phone] (ICD-10-CM) - Stress fracture of femoral shaft, left, sequela   THERAPY DIAG:  Pain in left leg  Difficulty in walking, not elsewhere classified  Muscle weakness (generalized)  Rationale for Evaluation and Treatment: Rehabilitation  ONSET DATE: left femur intramedullary nail on 08/10/2022   SUBJECTIVE:   SUBJECTIVE STATEMENT: Pt indicated the weather bothers her at time, she had flu shot and this seemed to make her whole leg have shooting pains. This is the only thing she can attribute this to. She would like to be able to walk longer but is limited by pain > .25 miles  PERTINENT HISTORY: s/p left femur fracture, no restrictions, WBAT, osteoporosis  PAIN:  NPRS scale: no pain upon arrival.  2/10 upon arrival.  Pain location: Lt thigh Pain description: throbbing, shooting Aggravating factors: unsure Relieving factors: over the counter NSAIDs, sitting with leg reclined  PRECAUTIONS: None  WEIGHT BEARING RESTRICTIONS: No  FALLS:  Has patient fallen in last 6 months? 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  Intramedullary nail in good  alignment without any complicating features.  No change in position  compared with intraoperative radiographs.   PATIENT  SURVEYS:  12/09/2022 : FOTO update:  59  10/21/22: FOTO improved to 47%  09/20/2022 Eval: FOTO functional intake 3%, goal is 47%   COGNITION: 09/20/2022 Overall cognitive status: Within functional limits for tasks assessed     SENSATION: 09/20/2022 [facility]  MUSCLE LENGTH: 09/20/2022 Eval: Maisie Fus test: Left very tight   PALPATION: 09/20/2022 Eval: TTP in left quads  LOWER EXTREMITY ROM:  Active ROM Right 09/20/2022 Left 09/20/2022 Left 10/21/22  Hip flexion     Hip extension     Hip abduction     Hip adduction     Hip internal rotation     Hip external rotation     Knee flexion 135 125 129  Knee extension  0   Ankle dorsiflexion     Ankle plantarflexion     Ankle inversion     Ankle eversion      (Blank rows = not tested)  LOWER EXTREMITY MMT:  MMT Right 09/20/2022 Left 09/20/2022 Left 09/29/2022 Left 10/21/22 Left 11/10/2022 Left 11/16/2022 Right 12/09/2022 Left 12/09/2022 Left 12/20/22  Hip flexion  3 4/5 4/5  5/5     Hip extension           Hip abduction  3+         Hip adduction           Hip internal rotation           Hip external rotation           Knee flexion 5 3+ 5/5 5/5 5/5      Knee extension 5 3+ 4/5 4+/5 5/5  5/5 48, 45 lbs 5/5 35.3, 33 lbs 35, 33 lbs  Ankle dorsiflexion   5/5        Ankle plantarflexion           Ankle inversion           Ankle eversion            (Blank rows = not tested)  FUNCTIONAL TESTS:  10/05/2022:  Able to perform without UE assist sit to stand  09/20/2022 Eval: Unable to sit to stand without UE support  GAIT: 12/09/2022: Capable of independent ambulation in clinic.  Pt continued to demonstrate/report use of SPC in community.   11/23/2022: Capable of independent ambulation.   10/27/2022:  Able to perform independent gait within clinic.   09/20/2022  Eval: Distance walked: 75 feet X 2 with bilateral crutches step to pattern, crutches appear to be too short for her but cannot be lengthened any more.                      TODAY'S TREATMENT:  DATE:  12/20/2022 Therex: UBE LE only lvl 2.5 10 mins  Leg press double leg 106 lbs 2 x 10, single 2 x 15 50 lbs  performed bilaterally Leg extension machine double leg up, single leg lowering 3 x 10 Lt leg 10 lbs   TherActivity 9 inch hurdle step to pattern 10 ft x 10 leading with each leg in // bars, minimal HHA Step on over and down WB on Rt leg 6 inch step x 10 with light hand rail assist  Lateral step up and down 6 inch step x 10 slow lowering focus, performed bilaterally with light hand assist on bar.  DATE:  12/09/2022 Therex: UBE LE only lvl 2.5 10 mins  Leg extension machine double leg up, single leg lowering 3 x 10 Rt leg 10 lbs   TherActivity Step on over and down WB on Rt leg 6 inch step x 20 with light hand rail assist  Lateral step down 6 inch step x 20 slow lowering focus, performed bilaterally with light hand assist on bar.   Neuro Re-ed SLS with vector reaching forward, lateral, reverse x 8 each direction, performed bilaterally.   TODAY'S TREATMENT:                                                                               DATE:  11/23/2022 Therex: UBE LE only lvl 2.5 10 mins  Leg press single 2 x 15 50 lbs  performed bilaterally Leg extension machine double leg up, single leg lowering 2 x 10 bilaterally 10 lbs  Knee flexion machine single leg 2 x 10 bilateral 10 lbs   TherActivity Step on over and down WB on Rt leg 4 inch step 2 x 10  Lateral step down 6 inch step x 20 slow lowering focus, performed bilaterally with light hand assist on bar.       PATIENT EDUCATION: 10/19/2022 Education details: HEP update Person educated: Patient Education method: Explanation, Demonstration, Verbal cues, and Handouts Education comprehension: verbalized understanding and needs further education   HOME EXERCISE PROGRAM: Access Code: [email or web address] URL:  [email or web address] Date: 10/19/2022 Prepared by: Chyrel Masson  Exercises - Seated Knee Extension with Resistance  - 2 x daily - 6 x weekly - 1-3 sets - 10 reps - Hip Abduction with Resistance Loop  - 1 x daily - 4-5 x weekly - 1-3 sets - 10 reps - Standing Hip Flexion with Resistance Loop  - 1 x daily - 4-5 x weekly - 1-3 sets - 10 reps - Side to Side Weight Shift with Counter Support  - 1 x daily - 4-5 x weekly - 1-3 sets - 10 reps - Seated Straight Leg Heel Taps  - 1-2 x daily - 7 x weekly - 3 sets - 10 reps - Sit to Stand  - 3 x daily - 7 x weekly - 1 sets - 10 reps - Supine Figure 4 Piriformis Stretch  - 2 x daily - 7 x weekly - 1 sets - 5 reps - 15 hold - Supine Piriformis Stretch with Foot on Ground  - 2 x daily - 7 x weekly - 1 sets - 5 reps - 15 hold  ASSESSMENT:  CLINICAL  IMPRESSION:  Doing okay overall but quad strength still lacking compared to her Rt leg and she can not ambulate quite as long as she would liike to . We did discuss walking program to tolerance to try to gradually improve this. Plan is to  transition towards independent HEP and she will meet with MD next week for follow up. I will keep her episode of care open for 30 days in the event she needs to return.   OBJECTIVE IMPAIRMENTS: decreased activity tolerance, difficulty walking, decreased balance, decreased endurance, decreased mobility, decreased ROM, decreased strength, impaired flexibility, impaired UE/LE use, postural dysfunction, and pain.  ACTIVITY LIMITATIONS: bending, lifting, carry, locomotion, cleaning, community activity, driving, and or occupation  PERSONAL FACTORS: anx, htn, S/P left femur intramedullary nail on 08/10/2022  are also affecting patient's functional outcome.  REHAB POTENTIAL: Good  CLINICAL DECISION MAKING: Stable/uncomplicated  EVALUATION COMPLEXITY: Low    GOALS: Short term PT Goals Target date: 10/18/2022   Pt will be I and compliant with HEP. Baseline:   Goal status: MET 10/12/22 Pt will decrease pain by 25% overall Baseline: Goal status: MET 10/12/22  Long term PT goals Target date: 01/06/2023  Pt will improve left knee AROM >130 deg to improve functional mobility Baseline: Goal status: revised for date   Pt will improve  hip/knee strength to at least 4+/5 MMT to improve functional strength Baseline: Goal status: Met 11/16/2022  Pt will improve FOTO to the predicted value to show improved function Baseline: Goal status:MET 10/21/22  Pt will reduce pain to overall less than 3/10 with usual activity and work activity. Baseline: Goal status: revised for date  Pt will be able to ambulate community distances at least 500 ft WFL gait pattern and LRAD without complaints Baseline: Goal status: revised for date  PLAN: PT FREQUENCY: 1x/week  PT DURATION: up to 4 weeks  PLANNED INTERVENTIONS (unless contraindicated): aquatic PT, Canalith repositioning, cryotherapy, Electrical stimulation, Iontophoresis with 4 mg/ml dexamethasome, Moist heat, traction, Ultrasound, gait training, Therapeutic exercise, balance training, neuromuscular re-education, patient/family education, prosthetic training, manual techniques, passive ROM, dry needling, taping, vasopnuematic device, vestibular, spinal manipulations, joint manipulations  PLAN FOR NEXT SESSION:  Hold PT up to 30 days to trial HEP, she will follow up PRN.   Ivery Quale, PT, DPT 12/20/22 4:20 PM   PHYSICAL THERAPY DISCHARGE SUMMARY  Visits from Start of Care: 18  Current functional level related to goals / functional outcomes: See note   Remaining deficits: See note   Education / Equipment: HEP  Patient goals were  mostly met . Patient is being discharged due to not returning since the last visit.  Chyrel Masson, PT, DPT, OCS, ATC 01/25/23  9:36 AM

## 2022-12-24 ENCOUNTER — Other Ambulatory Visit: Payer: Self-pay | Admitting: Internal Medicine

## 2022-12-24 ENCOUNTER — Other Ambulatory Visit: Payer: Self-pay | Admitting: Surgical

## 2022-12-24 DIAGNOSIS — J302 Other seasonal allergic rhinitis: Secondary | ICD-10-CM

## 2022-12-27 ENCOUNTER — Encounter: Payer: Self-pay | Admitting: Orthopedic Surgery

## 2022-12-27 ENCOUNTER — Other Ambulatory Visit: Payer: Self-pay | Admitting: Internal Medicine

## 2022-12-27 ENCOUNTER — Ambulatory Visit (INDEPENDENT_AMBULATORY_CARE_PROVIDER_SITE_OTHER): Payer: 59 | Admitting: Orthopedic Surgery

## 2022-12-27 ENCOUNTER — Ambulatory Visit: Payer: 59 | Admitting: Surgical

## 2022-12-27 DIAGNOSIS — J302 Other seasonal allergic rhinitis: Secondary | ICD-10-CM

## 2022-12-27 DIAGNOSIS — M84352S Stress fracture, left femur, sequela: Secondary | ICD-10-CM

## 2022-12-27 NOTE — Progress Notes (Unsigned)
Office Visit Note   Patient: Carol Maldonado           Date of Birth: Jun 01, 1960           MRN: [id number] Visit Date: 12/27/2022 Requested by: Anabel Halon, MD [address] [place],  [place] [address] PCP: Anabel Halon, MD  Subjective: Chief Complaint  Patient presents with   Left Leg - Routine Post Op     left femur intramedullary nail for impending stress fracture on 08/10/2022    HPI: Carol Maldonado is a 62 y.o. female who presents to the office reporting left leg pain.  She underwent left femur IM nail 08/10/2022 for impending stress fracture.  Had MRI of the lumbar spine also around that time which was negative for any type of compressive lesion.  She states that she is better but not where she needs to be.  She is in physical therapy.  The pain does wake her up going down her leg.  Has a prescription for Neurontin.  [place]  She has to walk at least that far to go to her work..                ROS: All systems reviewed are negative as they relate to the chief complaint within the history of present illness.  Patient denies fevers or chills.  Assessment & Plan: Visit Diagnoses: No diagnosis found.  Plan: Impression is continued left leg pain with radiographic healing and no hardware complications.  Does not look like it is coming from her back.  No evidence of any type of infection.  I think she needs little bit more time to get the leg strong and we will see her back in 8 weeks for final check.  Follow-Up Instructions: No follow-ups on file.   Orders:  No orders of the defined types were placed in this encounter.  No orders of the defined types were placed in this encounter.     Procedures: No procedures performed   Clinical Data: No additional findings.  Objective: Vital Signs: There were no vitals taken for this visit.  Physical Exam:  Constitutional: Patient appears well-developed HEENT:  Head:  Normocephalic Eyes:EOM are normal Neck: Normal range of motion Cardiovascular: Normal rate Pulmonary/chest: Effort normal Neurologic: Patient is alert Skin: Skin is warm Psychiatric: Patient has normal mood and affect  Ortho Exam: Ortho exam demonstrates no atrophy or nerve root tension signs left leg versus right.  Has very good hip flexion abduction adduction strength.  No groin pain with internal ex rotation of either leg.  Knee range of motion intact.  Specialty Comments:  No specialty comments available.  Imaging: No results found.   PMFS History: Patient Active Problem List   Diagnosis Date Noted   Secondary hyperparathyroidism of renal origin (HCC) 12/15/2022   Stress fracture of left femur 12/15/2022   Reactive airway disease 12/15/2022   Closed nondisplaced subtrochanteric fracture of femur with delayed healing 08/29/2022   GAD (generalized anxiety disorder) 06/10/2022   Hyperkalemia 11/20/2021   Encounter for general adult medical examination with abnormal findings 11/09/2021   Osteoporosis 03/19/2021   GERD (gastroesophageal reflux disease) 03/19/2021   HLD (hyperlipidemia) 03/19/2021   MDD (major depressive disorder), recurrent, in partial remission (HCC) 03/19/2021   Anemia 11/12/2020   Stage 3b chronic kidney disease (HCC) 08/05/2020   Colon cancer screening 06/23/2020   Insomnia, unspecified 03/03/2020   Essential  hypertension, benign 03/03/2020   Seasonal allergic rhinitis 03/03/2020   Past Medical History:  Diagnosis Date   Allergy    Phreesia 02/29/2020   Anxiety    Phreesia 02/29/2020   Hyperlipidemia    Phreesia 02/29/2020   Hypertension    Phreesia 02/29/2020   Insomnia    Osteoporosis    Phreesia 02/29/2020   Seasonal allergies     Family History  Problem Relation Age of Onset   Hypertension Mother    Heart attack Mother    Pneumonia Maternal Grandmother    Other Maternal Grandfather        MVA   Hypertension Father    High  Cholesterol Father    Irritable bowel syndrome Sister    Irritable bowel syndrome Sister    Colon cancer Neg Hx     Past Surgical History:  Procedure Laterality Date   COLONOSCOPY  03/27/2011   Dr. Jena Gauss; entirely normal exam and recommended repeat in 10 years.   COLONOSCOPY WITH PROPOFOL N/A 08/28/2020   Procedure: COLONOSCOPY WITH PROPOFOL;  Surgeon: Corbin Ade, MD;  [facility];  Service: Endoscopy;  Laterality: N/A;  8:30am   FEMUR IM NAIL Left 08/10/2022   Procedure: LEFT FEMUR INTRAMEDULLARY (IM) NAIL;  Surgeon: Cammy Copa, MD;  Location: [facility];  Service: Orthopedics;  Laterality: Left;   Social History   Occupational History   Not on file  Tobacco Use   Smoking status: Never   Smokeless tobacco: Never  Vaping Use   Vaping status: Never Used  Substance and Sexual Activity   Alcohol use: Never    Alcohol/week: 0.0 standard drinks of alcohol   Drug use: Never   Sexual activity: Not Currently    Birth control/protection: Post-menopausal

## 2022-12-28 ENCOUNTER — Other Ambulatory Visit: Payer: Self-pay | Admitting: Surgical

## 2022-12-28 ENCOUNTER — Telehealth: Payer: Self-pay | Admitting: Orthopedic Surgery

## 2022-12-28 MED ORDER — GABAPENTIN 300 MG PO CAPS: 300.0000 mg | ORAL_CAPSULE | Freq: Three times a day (TID) | ORAL | 0 refills | Status: DC | PRN

## 2022-12-28 MED ORDER — DESLORATADINE 5 MG PO TABS: 5.0000 mg | ORAL_TABLET | Freq: Every day | ORAL | 0 refills | Status: DC

## 2022-12-28 MED ORDER — AMLODIPINE-OLMESARTAN 10-20 MG PO TABS: 1.0000 | ORAL_TABLET | Freq: Every day | ORAL | 0 refills | Status: DC

## 2022-12-28 NOTE — Telephone Encounter (Signed)
[place] forms received. To [employer]. 

## 2023-01-27 ENCOUNTER — Other Ambulatory Visit: Payer: Self-pay | Admitting: Internal Medicine

## 2023-01-27 ENCOUNTER — Other Ambulatory Visit: Payer: Self-pay | Admitting: Surgical

## 2023-01-27 DIAGNOSIS — J302 Other seasonal allergic rhinitis: Secondary | ICD-10-CM

## 2023-01-28 ENCOUNTER — Other Ambulatory Visit: Payer: Self-pay | Admitting: Internal Medicine

## 2023-01-28 DIAGNOSIS — J302 Other seasonal allergic rhinitis: Secondary | ICD-10-CM

## 2023-01-28 MED ORDER — AMLODIPINE-OLMESARTAN 10-20 MG PO TABS: 1.0000 | ORAL_TABLET | Freq: Every day | ORAL | 0 refills | Status: DC

## 2023-02-14 ENCOUNTER — Other Ambulatory Visit (INDEPENDENT_AMBULATORY_CARE_PROVIDER_SITE_OTHER): Payer: 59

## 2023-02-14 ENCOUNTER — Ambulatory Visit (INDEPENDENT_AMBULATORY_CARE_PROVIDER_SITE_OTHER): Payer: 59 | Admitting: Orthopedic Surgery

## 2023-02-14 ENCOUNTER — Other Ambulatory Visit: Payer: Self-pay

## 2023-02-14 DIAGNOSIS — M79652 Pain in left thigh: Secondary | ICD-10-CM

## 2023-02-15 ENCOUNTER — Telehealth: Payer: Self-pay | Admitting: Orthopedic Surgery

## 2023-02-15 NOTE — Telephone Encounter (Signed)
[place] forms received. To [employer]. 

## 2023-02-16 ENCOUNTER — Telehealth: Payer: Self-pay | Admitting: Orthopedic Surgery

## 2023-02-16 NOTE — Telephone Encounter (Signed)
[place] forms received. To [employer]. 

## 2023-02-18 ENCOUNTER — Encounter: Payer: Self-pay | Admitting: Orthopedic Surgery

## 2023-02-18 NOTE — Progress Notes (Signed)
Office Visit Note   Patient: Carol Maldonado           Date of Birth: Feb 23, 1961           MRN: [id number] Visit Date: 02/14/2023 Requested by: Anabel Halon, MD [address] [place],  [place] [address] PCP: Anabel Halon, MD  Subjective: Chief Complaint  Patient presents with   Left Leg - Follow-up    08/10/22 left femur IM nail for impending stress fx    HPI: Carol Maldonado is a 62 y.o. female who presents to the office reporting for follow-up after left femur IM nail 08/10/2022 for stress fracture.  She is doing better than her last clinic visit but still reports some left thigh pain.  Pain does wake her from sleep at night.  She has occasional radicular pain.  Not working.  Gabapentin helps.  She took Dilaudid twice in October.  She is using a cane.  Has an occasional catch.  Cannot really walk a long distance but is able to get around the house..                ROS: All systems reviewed are negative as they relate to the chief complaint within the history of present illness.  Patient denies fevers or chills.  Assessment & Plan: Visit Diagnoses:  1. Left thigh pain     Plan: Impression is continued left leg pain.  Femur fracture is healed on radiographs today with no palpitating features.  This could be radicular in nature or possibly deconditioning.  SI joint sacrum and lumbar spine appear normal on available radiographs.  50-month return.  Out of work for 3 months.  She does have to do significant walking and standing in order to fulfill her job responsibilities.  Follow-Up Instructions: No follow-ups on file.   Orders:  Orders Placed This Encounter  Procedures   XR FEMUR MIN 2 VIEWS LEFT   No orders of the defined types were placed in this encounter.     Procedures: No procedures performed   Clinical Data: No additional findings.  Objective: Vital Signs: There were no vitals taken for this visit.  Physical Exam:  Constitutional: Patient appears  well-developed HEENT:  Head: Normocephalic Eyes:EOM are normal Neck: Normal range of motion Cardiovascular: Normal rate Pulmonary/chest: Effort normal Neurologic: Patient is alert Skin: Skin is warm Psychiatric: Patient has normal mood and affect  Ortho Exam: On examination patient has symmetric hip flexion adduction and abduction strength.  No nerve root tension signs.  No paresthesias L1 S1 bilaterally.  Note groin pain with internal/external rotation of either leg.  Not too much in the way of muscle atrophy.  No knee effusion on either side.  No trochanteric tenderness to direct palpation left versus right  Specialty Comments:  No specialty comments available.  Imaging: No results found.   PMFS History: Patient Active Problem List   Diagnosis Date Noted   Secondary hyperparathyroidism of renal origin (HCC) 12/15/2022   Stress fracture of left femur 12/15/2022   Reactive airway disease 12/15/2022   Closed nondisplaced subtrochanteric fracture of femur with delayed healing 08/29/2022   GAD (generalized anxiety disorder) 06/10/2022   Hyperkalemia 11/20/2021   Encounter for general adult medical examination with abnormal findings 11/09/2021   Osteoporosis 03/19/2021   GERD (gastroesophageal reflux disease) 03/19/2021   HLD (hyperlipidemia) 03/19/2021   MDD (major depressive disorder), recurrent, in partial remission (HCC) 03/19/2021   Anemia 11/12/2020   Stage 3b chronic kidney disease (  HCC) 08/05/2020   Colon cancer screening 06/23/2020   Insomnia, unspecified 03/03/2020   Essential hypertension, benign 03/03/2020   Seasonal allergic rhinitis 03/03/2020   Past Medical History:  Diagnosis Date   Allergy    Phreesia 02/29/2020   Anxiety    Phreesia 02/29/2020   Hyperlipidemia    Phreesia 02/29/2020   Hypertension    Phreesia 02/29/2020   Insomnia    Osteoporosis    Phreesia 02/29/2020   Seasonal allergies     Family History  Problem Relation Age of Onset    Hypertension Mother    Heart attack Mother    Pneumonia Maternal Grandmother    Other Maternal Grandfather        MVA   Hypertension Father    High Cholesterol Father    Irritable bowel syndrome Sister    Irritable bowel syndrome Sister    Colon cancer Neg Hx     Past Surgical History:  Procedure Laterality Date   COLONOSCOPY  03/27/2011   Dr. Jena Gauss; entirely normal exam and recommended repeat in 10 years.   COLONOSCOPY WITH PROPOFOL N/A 08/28/2020   Procedure: COLONOSCOPY WITH PROPOFOL;  Surgeon: Corbin Ade, MD;  [facility];  Service: Endoscopy;  Laterality: N/A;  8:30am   FEMUR IM NAIL Left 08/10/2022   Procedure: LEFT FEMUR INTRAMEDULLARY (IM) NAIL;  Surgeon: Cammy Copa, MD;  Location: [facility];  Service: Orthopedics;  Laterality: Left;   Social History   Occupational History   Not on file  Tobacco Use   Smoking status: Never   Smokeless tobacco: Never  Vaping Use   Vaping status: Never Used  Substance and Sexual Activity   Alcohol use: Never    Alcohol/week: 0.0 standard drinks of alcohol   Drug use: Never   Sexual activity: Not Currently    Birth control/protection: Post-menopausal

## 2023-02-25 ENCOUNTER — Other Ambulatory Visit: Payer: Self-pay | Admitting: Internal Medicine

## 2023-02-25 DIAGNOSIS — J302 Other seasonal allergic rhinitis: Secondary | ICD-10-CM

## 2023-02-27 ENCOUNTER — Other Ambulatory Visit: Payer: Self-pay | Admitting: Internal Medicine

## 2023-02-27 DIAGNOSIS — J302 Other seasonal allergic rhinitis: Secondary | ICD-10-CM

## 2023-02-28 MED ORDER — DESLORATADINE 5 MG PO TABS: 5.0000 mg | ORAL_TABLET | Freq: Every day | ORAL | 0 refills | Status: DC

## 2023-02-28 MED ORDER — AMLODIPINE-OLMESARTAN 10-20 MG PO TABS: 1.0000 | ORAL_TABLET | Freq: Every day | ORAL | 0 refills | Status: DC

## 2023-03-14 LAB — HM MAMMOGRAPHY

## 2023-03-25 ENCOUNTER — Other Ambulatory Visit: Payer: Self-pay | Admitting: Internal Medicine

## 2023-03-25 DIAGNOSIS — J302 Other seasonal allergic rhinitis: Secondary | ICD-10-CM

## 2023-04-19 ENCOUNTER — Encounter: Payer: 59 | Admitting: Internal Medicine

## 2023-04-25 ENCOUNTER — Telehealth: Payer: Self-pay | Admitting: Radiology

## 2023-04-25 NOTE — Telephone Encounter (Signed)
Nyeshia with [employer] called stating patient has submitted claim for disability with her insurance and listed Dr. August Saucer as one of her providers. Noreene Filbert states that they must speak with the doctor to go over some questions to try and get patient's disability approved. She requests return call to [phone].  Reference number [id number]

## 2023-04-26 ENCOUNTER — Other Ambulatory Visit: Payer: Self-pay | Admitting: Internal Medicine

## 2023-04-26 ENCOUNTER — Other Ambulatory Visit: Payer: Self-pay | Admitting: Surgical

## 2023-04-26 DIAGNOSIS — J302 Other seasonal allergic rhinitis: Secondary | ICD-10-CM

## 2023-04-26 NOTE — Telephone Encounter (Signed)
Hi Lauren I answered their written questions .  I thought that the independent medical examination she had was pretty decent.  I am not sure why she is having this difficulty but we have worked it up to the hilt.  Fracture is healed.  I have already answered their questions and therefore I do not really see an obligation for me to return their call and to talk about it even more what are your thoughts

## 2023-05-18 ENCOUNTER — Ambulatory Visit: Payer: 59 | Admitting: Orthopedic Surgery

## 2023-05-25 ENCOUNTER — Other Ambulatory Visit: Payer: Self-pay | Admitting: Internal Medicine

## 2023-05-25 DIAGNOSIS — J302 Other seasonal allergic rhinitis: Secondary | ICD-10-CM

## 2023-06-01 ENCOUNTER — Ambulatory Visit: Payer: 59 | Admitting: Orthopedic Surgery

## 2023-06-15 ENCOUNTER — Other Ambulatory Visit: Payer: Self-pay

## 2023-06-15 ENCOUNTER — Ambulatory Visit: Admitting: Orthopedic Surgery

## 2023-06-15 DIAGNOSIS — E782 Mixed hyperlipidemia: Secondary | ICD-10-CM

## 2023-06-15 DIAGNOSIS — M79652 Pain in left thigh: Secondary | ICD-10-CM

## 2023-06-15 DIAGNOSIS — E0789 Other specified disorders of thyroid: Secondary | ICD-10-CM

## 2023-06-15 DIAGNOSIS — I10 Essential (primary) hypertension: Secondary | ICD-10-CM

## 2023-06-15 DIAGNOSIS — R7303 Prediabetes: Secondary | ICD-10-CM

## 2023-06-15 DIAGNOSIS — E559 Vitamin D deficiency, unspecified: Secondary | ICD-10-CM

## 2023-06-16 ENCOUNTER — Encounter: Payer: Self-pay | Admitting: Orthopedic Surgery

## 2023-06-16 LAB — CBC WITH DIFFERENTIAL/PLATELET
Basophils Absolute: 0 10*3/uL (ref 0.0–0.2)
Basos: 0 %
EOS (ABSOLUTE): 0.2 10*3/uL (ref 0.0–0.4)
Eos: 2 %
Hematocrit: 30.4 % — ABNORMAL LOW (ref 34.0–46.6)
Hemoglobin: 10 g/dL — ABNORMAL LOW (ref 11.1–15.9)
Immature Grans (Abs): 0 10*3/uL (ref 0.0–0.1)
Immature Granulocytes: 0 %
Lymphocytes Absolute: 1.2 10*3/uL (ref 0.7–3.1)
Lymphs: 16 %
MCH: 28.3 pg (ref 26.6–33.0)
MCHC: 32.9 g/dL (ref 31.5–35.7)
MCV: 86 fL (ref 79–97)
Monocytes Absolute: 0.4 10*3/uL (ref 0.1–0.9)
Monocytes: 6 %
Neutrophils Absolute: 5.5 10*3/uL (ref 1.4–7.0)
Neutrophils: 76 %
Platelets: 207 10*3/uL (ref 150–450)
RBC: 3.53 x10E6/uL — ABNORMAL LOW (ref 3.77–5.28)
RDW: 13.4 % (ref 11.7–15.4)
WBC: 7.3 10*3/uL (ref 3.4–10.8)

## 2023-06-16 LAB — CMP14+EGFR
ALT: 14 IU/L (ref 0–32)
AST: 22 IU/L (ref 0–40)
Albumin: 4.7 g/dL (ref 3.9–4.9)
Alkaline Phosphatase: 111 IU/L (ref 44–121)
BUN/Creatinine Ratio: 16 (ref 12–28)
BUN: 22 mg/dL (ref 8–27)
Bilirubin Total: <0.2 mg/dL (ref 0.0–1.2)
CO2: 21 mmol/L (ref 20–29)
Calcium: 9.5 mg/dL (ref 8.7–10.3)
Chloride: 106 mmol/L (ref 96–106)
Creatinine, Ser: 1.4 mg/dL — ABNORMAL HIGH (ref 0.57–1.00)
Globulin, Total: 2.4 g/dL (ref 1.5–4.5)
Glucose: 96 mg/dL (ref 70–99)
Potassium: 5.2 mmol/L (ref 3.5–5.2)
Sodium: 141 mmol/L (ref 134–144)
Total Protein: 7.1 g/dL (ref 6.0–8.5)
eGFR: 42 mL/min/{1.73_m2} — ABNORMAL LOW (ref >59)

## 2023-06-16 LAB — LIPID PANEL
Chol/HDL Ratio: 2.4 ratio (ref 0.0–4.4)
Cholesterol, Total: 125 mg/dL (ref 100–199)
HDL: 52 mg/dL (ref >39)
LDL Chol Calc (NIH): 53 mg/dL (ref 0–99)
Triglycerides: 108 mg/dL (ref 0–149)
VLDL Cholesterol Cal: 20 mg/dL (ref 5–40)

## 2023-06-16 LAB — HEMOGLOBIN A1C
Est. average glucose Bld gHb Est-mCnc: 123 mg/dL
Hgb A1c MFr Bld: 5.9 % — ABNORMAL HIGH (ref 4.8–5.6)

## 2023-06-16 LAB — VITAMIN D 25 HYDROXY (VIT D DEFICIENCY, FRACTURES): Vit D, 25-Hydroxy: 55.6 ng/mL (ref 30.0–100.0)

## 2023-06-16 LAB — TSH: TSH: 1.75 u[IU]/mL (ref 0.450–4.500)

## 2023-06-16 NOTE — Progress Notes (Unsigned)
 Office Visit Note   Patient: Carol Maldonado           Date of Birth: 1960/08/31           MRN: [id number] Visit Date: 06/15/2023 Requested by: Anabel Halon, MD [address] [place],  [place] [address] PCP: Anabel Halon, MD  Subjective: Chief Complaint  Patient presents with   Left Leg - Follow-up    08/10/22 left femur IM nail for impending stress fx    HPI: Carol Maldonado is a 63 y.o. female who presents to the office reporting continued left leg pain.  She had a left femoral IM nail for impending stress fracture 08/10/2022.  Some days are okay.  She does take gabapentin.  She may not the working too much longer in her job which does require a long way to walk to actually get to work..                ROS: All systems reviewed are negative as they relate to the chief complaint within the history of present illness.  Patient denies fevers or chills.  Assessment & Plan: Visit Diagnoses:  1. Left thigh pain     Plan: Impression is healed left femoral stress reaction.  MRI lumbar spine done last year unremarkable.  Overall she is making progress.  Still not perfect in terms of ambulatory ability but I do not think there is any further intervention or workup required for the left leg at this time.  She will follow-up with [place] as needed.  Follow-Up Instructions: No follow-ups on file.   Orders:  No orders of the defined types were placed in this encounter.  No orders of the defined types were placed in this encounter.     Procedures: No procedures performed   Clinical Data: No additional findings.  Objective: Vital Signs: There were no vitals taken for this visit.  Physical Exam:  Constitutional: Patient appears well-developed HEENT:  Head: Normocephalic Eyes:EOM are normal Neck: Normal range of motion Cardiovascular: Normal rate Pulmonary/chest: Effort normal Neurologic: Patient is alert Skin: Skin is warm Psychiatric: Patient has normal mood and  affect  Ortho Exam: Ortho exam demonstrates slightly antalgic gait to the left.  Has 5 out of 5 ankle dorsiflexion plantarflexion quad hamstring strength.  Hip flexion strength is very close to symmetric left versus right.  Abduction adduction strength also intact.  No other masses lymphadenopathy or skin changes or swelling noted in that left leg region.  Specialty Comments:  No specialty comments available.  Imaging: No results found.   PMFS History: Patient Active Problem List   Diagnosis Date Noted   Secondary hyperparathyroidism of renal origin (HCC) 12/15/2022   Stress fracture of left femur 12/15/2022   Reactive airway disease 12/15/2022   Closed nondisplaced subtrochanteric fracture of femur with delayed healing 08/29/2022   GAD (generalized anxiety disorder) 06/10/2022   Hyperkalemia 11/20/2021   Encounter for general adult medical examination with abnormal findings 11/09/2021   Osteoporosis 03/19/2021   GERD (gastroesophageal reflux disease) 03/19/2021   HLD (hyperlipidemia) 03/19/2021   MDD (major depressive disorder), recurrent, in partial remission (HCC) 03/19/2021   Anemia 11/12/2020   Stage 3b chronic kidney disease (HCC) 08/05/2020   Colon cancer screening 06/23/2020   Insomnia, unspecified 03/03/2020   Essential hypertension, benign 03/03/2020   Seasonal allergic rhinitis 03/03/2020   Past Medical History:  Diagnosis Date   Allergy    Phreesia 02/29/2020   Anxiety    Phreesia  02/29/2020   Hyperlipidemia    Phreesia 02/29/2020   Hypertension    Phreesia 02/29/2020   Insomnia    Osteoporosis    Phreesia 02/29/2020   Seasonal allergies     Family History  Problem Relation Age of Onset   Hypertension Mother    Heart attack Mother    Pneumonia Maternal Grandmother    Other Maternal Grandfather        MVA   Hypertension Father    High Cholesterol Father    Irritable bowel syndrome Sister    Irritable bowel syndrome Sister    Colon cancer Neg Hx      Past Surgical History:  Procedure Laterality Date   COLONOSCOPY  03/27/2011   Dr. Jena Gauss; entirely normal exam and recommended repeat in 10 years.   COLONOSCOPY WITH PROPOFOL N/A 08/28/2020   Procedure: COLONOSCOPY WITH PROPOFOL;  Surgeon: Corbin Ade, MD;  Location: [facility];  Service: Endoscopy;  Laterality: N/A;  8:30am   FEMUR IM NAIL Left 08/10/2022   Procedure: LEFT FEMUR INTRAMEDULLARY (IM) NAIL;  Surgeon: Cammy Copa, MD;  Location: [facility];  Service: Orthopedics;  Laterality: Left;   Social History   Occupational History   Not on file  Tobacco Use   Smoking status: Never   Smokeless tobacco: Never  Vaping Use   Vaping status: Never Used  Substance and Sexual Activity   Alcohol use: Never    Alcohol/week: 0.0 standard drinks of alcohol   Drug use: Never   Sexual activity: Not Currently    Birth control/protection: Post-menopausal

## 2023-06-22 ENCOUNTER — Other Ambulatory Visit: Payer: Self-pay | Admitting: Internal Medicine

## 2023-06-22 ENCOUNTER — Encounter: Payer: Self-pay | Admitting: Internal Medicine

## 2023-06-22 ENCOUNTER — Other Ambulatory Visit: Payer: Self-pay | Admitting: Surgical

## 2023-06-22 ENCOUNTER — Ambulatory Visit (INDEPENDENT_AMBULATORY_CARE_PROVIDER_SITE_OTHER): Payer: 59 | Admitting: Internal Medicine

## 2023-06-22 VITALS — BP 144/70 | HR 119 | Ht 61.0 in | Wt 165.0 lb

## 2023-06-22 DIAGNOSIS — I10 Essential (primary) hypertension: Secondary | ICD-10-CM

## 2023-06-22 DIAGNOSIS — J302 Other seasonal allergic rhinitis: Secondary | ICD-10-CM

## 2023-06-22 DIAGNOSIS — M8080XS Other osteoporosis with current pathological fracture, unspecified site, sequela: Secondary | ICD-10-CM

## 2023-06-22 DIAGNOSIS — K219 Gastro-esophageal reflux disease without esophagitis: Secondary | ICD-10-CM

## 2023-06-22 DIAGNOSIS — N1832 Chronic kidney disease, stage 3b: Secondary | ICD-10-CM | POA: Diagnosis not present

## 2023-06-22 DIAGNOSIS — F3341 Major depressive disorder, recurrent, in partial remission: Secondary | ICD-10-CM | POA: Diagnosis not present

## 2023-06-22 DIAGNOSIS — N2581 Secondary hyperparathyroidism of renal origin: Secondary | ICD-10-CM

## 2023-06-22 DIAGNOSIS — Z0001 Encounter for general adult medical examination with abnormal findings: Secondary | ICD-10-CM

## 2023-06-22 DIAGNOSIS — E782 Mixed hyperlipidemia: Secondary | ICD-10-CM

## 2023-06-22 DIAGNOSIS — G4700 Insomnia, unspecified: Secondary | ICD-10-CM

## 2023-06-22 DIAGNOSIS — D631 Anemia in chronic kidney disease: Secondary | ICD-10-CM

## 2023-06-22 MED ORDER — AMLODIPINE-OLMESARTAN 10-20 MG PO TABS: 1.0000 | ORAL_TABLET | Freq: Every day | ORAL | 1 refills | Status: DC

## 2023-06-22 MED ORDER — DESLORATADINE 5 MG PO TABS: 5.0000 mg | ORAL_TABLET | Freq: Every day | ORAL | 11 refills | Status: AC

## 2023-06-22 MED ORDER — RABEPRAZOLE SODIUM 20 MG PO TBEC: 20.0000 mg | DELAYED_RELEASE_TABLET | Freq: Every day | ORAL | 1 refills | Status: DC

## 2023-06-22 MED ORDER — SERTRALINE HCL 50 MG PO TABS: 50.0000 mg | ORAL_TABLET | Freq: Every day | ORAL | 1 refills | Status: DC

## 2023-06-22 MED ORDER — ROSUVASTATIN CALCIUM 20 MG PO TABS: 20.0000 mg | ORAL_TABLET | Freq: Every day | ORAL | 1 refills | Status: DC

## 2023-06-22 NOTE — Assessment & Plan Note (Addendum)
 Likely due to CKD Denies any active signs of bleeding currently Continue iron supplement - advised to take ferrous sulfate 325 mg BID now instead of QD

## 2023-06-22 NOTE — Assessment & Plan Note (Signed)
 BP Readings from Last 1 Encounters:  06/22/23 (!) 144/70   Elevated today Usually well-controlled with Amlodipine-Olmesartan 10-20 mg QD now Advised to check BP at home and bring the log in the next visit Counseled for compliance with the medications Advised DASH diet and moderate exercise/walking, at least 150 mins/week

## 2023-06-22 NOTE — Assessment & Plan Note (Addendum)
 Last BMP reviewed, stable GFR now - 42, around baseline Avoid nephrotoxic agents On ARB Followed by nephrology

## 2023-06-22 NOTE — Assessment & Plan Note (Signed)
 Well-controlled with Zoloft 50 mg QD

## 2023-06-22 NOTE — Assessment & Plan Note (Signed)
 Lab Results  Component Value Date   PTH 91 (H) 12/09/2022   CALCIUM 9.5 06/15/2023   Likely due to CKD On Rayaldee for Vitamin D deficiency, has been out of it due to shortage at her pharmacy, sent to another pharmacy as per patient request

## 2023-06-22 NOTE — Assessment & Plan Note (Signed)
 Was on alendronate since 2020, caused stress fracture (?), Dced alendronate Avoid bisphosphonate due to history of stress fracture Plan to get DEXA scan and starting Prolia after it

## 2023-06-22 NOTE — Assessment & Plan Note (Signed)
 Overall well-controlled Takes Xanax 0.5 mg nightly as needed

## 2023-06-22 NOTE — Assessment & Plan Note (Addendum)
 Physical exam as documented. Fasting blood tests reviewed today. PAP smear with Ob/Gyn.

## 2023-06-22 NOTE — Assessment & Plan Note (Signed)
Well controlled with Clarinex Uses azelastine and Nasalide nasal spray

## 2023-06-22 NOTE — Assessment & Plan Note (Signed)
Well-controlled with Rabeprazole Advised to try Pepcid instead of rabeprazole to reduce worsening of osteoporosis

## 2023-06-22 NOTE — Patient Instructions (Signed)
 Please continue to take medications as prescribed.  Please continue to follow low salt diet and perform moderate exercise/walking as tolerated.  Please contact us if blood pressure remains above 140/90 on 3 consecutive readings.

## 2023-06-22 NOTE — Assessment & Plan Note (Signed)
On statin Lipid profile reviewed 

## 2023-06-22 NOTE — Progress Notes (Signed)
 Established Patient Office Visit  Subjective:  Patient ID: Carol Maldonado, female    DOB: September 19, 1960  Age: 63 y.o. MRN: [id number]  CC:  Chief Complaint  Patient presents with   Annual Exam    Cpe / 4 month f/u    HPI Carol Maldonado is a 63 y.o. female with past medical history of HTN, GERD, osteoporosis, CKD stage IIIb, depression with insomnia and HLD who presents for annual physical.  HTN: BP is elevated today.  She attributes it to her left thigh pain.  Takes medications regularly. Patient denies headache, dizziness, chest pain, dyspnea or palpitations.  CKD stage 3b: BMP reviewed from chart, GFR at 42, stable compared to prior.  She has h/o hypokalemia, but it is better now - potassium at 5.2. She has cut down potassium rich food. She denies any dysuria, hematuria, urinary hesitance or resistance.  She is seeing Nephrology.  She is on ARB and vitamin D supplements currently. Her Hb was 10.0, slightly worse compared to prior. She denies any signs of bleeding, such as melena, hematochezia or hematuria.  Stress fracture of femur: She had stress fracture of femur, s/p left femur intramedullary nail placement on 08/10/22.  She was told that it was due to Fosamax use (2020-2024).  She has stopped taking Fosamax for osteoporosis. She has also stopped OTC calcium and vitamin D supplement.  She takes Rayaldee for vitamin D deficiency and CKD.  She is undergoing PT currently.  She still has left hip pain, which is slowly improving.  She has Dilaudid as needed for severe pain and gabapentin for neuropathic pain.  She prefers to take Tylenol instead for hip pain.  She takes Rabeprazole for GERD. Denies any dysphagia or odynophagia.  She has been taking Zoloft for depression and takes Xanax at bedtime for insomnia.  Denies any anhedonia, SI or HI.  Past Medical History:  Diagnosis Date   Allergy    Phreesia 02/29/2020   Anxiety    Phreesia 02/29/2020   Hyperlipidemia    Phreesia  02/29/2020   Hypertension    Phreesia 02/29/2020   Insomnia    Osteoporosis    Phreesia 02/29/2020   Seasonal allergies     Past Surgical History:  Procedure Laterality Date   COLONOSCOPY  03/27/2011   Dr. Jena Gauss; entirely normal exam and recommended repeat in 10 years.   COLONOSCOPY WITH PROPOFOL N/A 08/28/2020   Procedure: COLONOSCOPY WITH PROPOFOL;  Surgeon: Corbin Ade, MD;  [facility]: [facility];  Service: Endoscopy;  Laterality: N/A;  8:30am   FEMUR IM NAIL Left 08/10/2022   Procedure: LEFT FEMUR INTRAMEDULLARY (IM) NAIL;  Surgeon: Cammy Copa, MD;  Location: [facility];  Service: Orthopedics;  Laterality: Left;    Family History  Problem Relation Age of Onset   Hypertension Mother    Heart attack Mother    Pneumonia Maternal Grandmother    Other Maternal Grandfather        MVA   Hypertension Father    High Cholesterol Father    Irritable bowel syndrome Sister    Irritable bowel syndrome Sister    Colon cancer Neg Hx     Social History   Socioeconomic History   Marital status: Single    Spouse name: Not on file   Number of children: Not on file   Years of education: Not on file   Highest education level: Not on file  Occupational History   Not on file  Tobacco Use  Smoking status: Never   Smokeless tobacco: Never  Vaping Use   Vaping status: Never Used  Substance and Sexual Activity   Alcohol use: Never    Alcohol/week: 0.0 standard drinks of alcohol   Drug use: Never   Sexual activity: Not Currently    Birth control/protection: Post-menopausal  Other Topics Concern   Not on file  Social History Narrative   Not on file   Social Drivers of Health   Financial Resource Strain: Low Risk  (03/04/2022)   Overall Financial Resource Strain (CARDIA)    Difficulty of Paying Living Expenses: Not hard at all  Food Insecurity: No Food Insecurity (03/04/2022)   Hunger Vital Sign    Worried About Running Out of Food in the Last Year: Never true    Ran Out  of Food in the Last Year: Never true  Transportation Needs: No Transportation Needs (03/04/2022)   PRAPARE - Administrator, Civil Service (Medical): No    Lack of Transportation (Non-Medical): No  Physical Activity: Insufficiently Active (03/04/2022)   Exercise Vital Sign    Days of Exercise per Week: 5 days    Minutes of Exercise per Session: 10 min  Stress: No Stress Concern Present (03/04/2022)   [employer] of Occupational Health - Occupational Stress Questionnaire    Feeling of Stress : Not at all  Social Connections: Moderately Integrated (03/04/2022)   Social Connection and Isolation Panel [NHANES]    Frequency of Communication with Friends and Family: More than three times a week    Frequency of Social Gatherings with Friends and Family: Once a week    Attends Religious Services: More than 4 times per year    Active Member of [employer] or Organizations: Yes    Attends Engineer, structural: More than 4 times per year    Marital Status: Never married  Intimate Partner Violence: Not At Risk (03/04/2022)   Humiliation, Afraid, Rape, and Kick questionnaire    Fear of Current or Ex-Partner: No    Emotionally Abused: No    Physically Abused: No    Sexually Abused: No    Outpatient Medications Prior to Visit  Medication Sig Dispense Refill   albuterol (VENTOLIN HFA) 108 (90 Base) MCG/ACT inhaler Inhale 2 puffs into the lungs every 6 (six) hours as needed. 18 g 3   ALPRAZolam (XANAX) 0.5 MG tablet take 1 tablet by mouth at bedtime. 30 tablet 3   azelastine (ASTELIN) 0.1 % nasal spray place 1 spray into both nostrils 2 (two) times daily. use in each nostril as directed 30 mL 0   flunisolide (NASALIDE) 25 MCG/ACT (0.025%) SOLN Place 2 sprays into the nose 2 (two) times daily. 25 mL 5   gabapentin (NEURONTIN) 300 MG capsule take 1 capsule (300 milligram total) by mouth 3 (three) times daily as needed. 60 capsule 2   HYDROmorphone (DILAUDID) 2 MG tablet Take 0.5  tablets (1 mg total) by mouth every 8 (eight) hours as needed for severe pain. 15 tablet 0   Naphazoline-Pheniramine (OPCON-A) 0.027-0.315 % SOLN Place 1 drop into both eyes 3 (three) times daily as needed (allergy eyes.).     Omega-3 Fatty Acids (FISH OIL) 1000 MG CAPS Take 1,000 mg by mouth in the morning.     RAYALDEE 30 MCG CPCR Take 1 capsule (30 mcg total) by mouth 2 (two) times a week. Take 1 capsule (30 mcg) by mouth on Mondays & Fridays in the evening. 8 capsule 5   alendronate (  FOSAMAX) 70 MG tablet take 1 tablet (70 milligram total) by mouth once a week. take with a full glass of water on an empty stomach. 4 tablet 0   amlodipine-olmesartan (AZOR) 10-20 MG tablet Take 1 tablet by mouth daily. 90 tablet 0   desloratadine (CLARINEX) 5 MG tablet take 1 tablet (5 milligram total) by mouth daily. 30 tablet 0   RABEprazole (ACIPHEX) 20 MG tablet Take 1 tablet (20 mg total) by mouth daily. 90 tablet 1   rosuvastatin (CRESTOR) 20 MG tablet Take 1 tablet (20 mg total) by mouth daily. 90 tablet 1   sertraline (ZOLOFT) 50 MG tablet Take 1 tablet (50 mg total) by mouth daily. 90 tablet 1   No facility-administered medications prior to visit.    Allergies  Allergen Reactions   Amoxicillin Hives, Itching and Rash   Codeine Hives, Itching and Rash   Hydrocodone Rash   Keflex [Cephalexin] Rash   Penicillins Rash   Sulfa Antibiotics Rash    ROS Review of Systems  Constitutional:  Negative for chills and fever.  HENT:  Negative for congestion, sinus pressure, sinus pain and sore throat.   Eyes:  Negative for pain and discharge.  Respiratory:  Negative for cough and shortness of breath.   Cardiovascular:  Negative for chest pain and palpitations.  Gastrointestinal:  Negative for abdominal pain, diarrhea, nausea and vomiting.  Endocrine: Negative for polydipsia and polyuria.  Genitourinary:  Negative for dysuria and hematuria.  Musculoskeletal:  Positive for arthralgias (Left hip). Negative  for neck pain and neck stiffness.  Skin:  Negative for rash.  Neurological:  Negative for dizziness and weakness.  Psychiatric/Behavioral:  Positive for sleep disturbance. Negative for agitation and behavioral problems. The patient is nervous/anxious.       Objective:    Physical Exam Vitals reviewed.  Constitutional:      General: She is not in acute distress.    Appearance: She is not diaphoretic.     Comments: Has a cane for walking support  HENT:     Head: Normocephalic and atraumatic.     Nose: Nose normal. No congestion.     Mouth/Throat:     Mouth: Mucous membranes are moist.     Pharynx: No posterior oropharyngeal erythema.  Eyes:     General: No scleral icterus.    Extraocular Movements: Extraocular movements intact.  Cardiovascular:     Rate and Rhythm: Normal rate and regular rhythm.     Pulses: Normal pulses.     Heart sounds: Normal heart sounds. No murmur heard. Pulmonary:     Breath sounds: Normal breath sounds. No wheezing or rales.  Musculoskeletal:     Cervical back: Neck supple. No tenderness.     Right hip: Decreased range of motion.     Left hip: Tenderness present.     Left knee: Normal range of motion. No tenderness.     Right lower leg: No edema.     Left lower leg: No edema.  Skin:    General: Skin is warm.     Findings: No rash.  Neurological:     General: No focal deficit present.     Mental Status: She is alert and oriented to person, place, and time.     Sensory: No sensory deficit.     Motor: No weakness.  Psychiatric:        Mood and Affect: Mood normal.        Behavior: Behavior normal.     BP Marland Kitchen)  144/70 (BP Location: Left Arm)   Pulse (!) 119   Ht 5\' 1"  (1.549 m)   Wt 165 lb (74.8 kg)   SpO2 96%   BMI 31.18 kg/m  Wt Readings from Last 3 Encounters:  06/22/23 165 lb (74.8 kg)  12/15/22 154 lb 9.6 oz (70.1 kg)  08/10/22 150 lb (68 kg)    Lab Results  Component Value Date   TSH 1.750 06/15/2023   Lab Results  Component  Value Date   WBC 7.3 06/15/2023   HGB 10.0 (L) 06/15/2023   HCT 30.4 (L) 06/15/2023   MCV 86 06/15/2023   PLT 207 06/15/2023   Lab Results  Component Value Date   NA 141 06/15/2023   K 5.2 06/15/2023   CO2 21 06/15/2023   GLUCOSE 96 06/15/2023   BUN 22 06/15/2023   CREATININE 1.40 (H) 06/15/2023   BILITOT <0.2 06/15/2023   ALKPHOS 111 06/15/2023   AST 22 06/15/2023   ALT 14 06/15/2023   PROT 7.1 06/15/2023   ALBUMIN 4.7 06/15/2023   CALCIUM 9.5 06/15/2023   ANIONGAP 8 08/10/2022   EGFR 42 (L) 06/15/2023   Lab Results  Component Value Date   CHOL 125 06/15/2023   Lab Results  Component Value Date   HDL 52 06/15/2023   Lab Results  Component Value Date   LDLCALC 53 06/15/2023   Lab Results  Component Value Date   TRIG 108 06/15/2023   Lab Results  Component Value Date   CHOLHDL 2.4 06/15/2023   Lab Results  Component Value Date   HGBA1C 5.9 (H) 06/15/2023      Assessment & Plan:   Problem List Items Addressed This Visit       Cardiovascular and Mediastinum   Essential hypertension, benign   BP Readings from Last 1 Encounters:  06/22/23 (!) 144/70   Elevated today Usually well-controlled with Amlodipine-Olmesartan 10-20 mg QD now Advised to check BP at home and bring the log in the next visit Counseled for compliance with the medications Advised DASH diet and moderate exercise/walking, at least 150 mins/week      Relevant Medications   amlodipine-olmesartan (AZOR) 10-20 MG tablet   rosuvastatin (CRESTOR) 20 MG tablet     Respiratory   Seasonal allergic rhinitis   Well controlled with Clarinex Uses azelastine and Nasalide nasal spray      Relevant Medications   desloratadine (CLARINEX) 5 MG tablet     Digestive   GERD (gastroesophageal reflux disease)   Well-controlled with Rabeprazole Advised to try Pepcid instead of rabeprazole to reduce worsening of osteoporosis      Relevant Medications   RABEprazole (ACIPHEX) 20 MG tablet      Endocrine   Secondary hyperparathyroidism of renal origin (HCC)   Lab Results  Component Value Date   PTH 91 (H) 12/09/2022   CALCIUM 9.5 06/15/2023   Likely due to CKD On Rayaldee for Vitamin D deficiency, has been out of it due to shortage at her pharmacy, sent to another pharmacy as per patient request        Musculoskeletal and Integument   Osteoporosis   Was on alendronate since 2020, caused stress fracture (?), Dced alendronate Avoid bisphosphonate due to history of stress fracture Plan to get DEXA scan and starting Prolia after it      Relevant Orders   DG Bone Density     Genitourinary   Stage 3b chronic kidney disease (HCC)   Last BMP reviewed, stable GFR now - 42,  around baseline Avoid nephrotoxic agents On ARB Followed by nephrology        Other   Insomnia, unspecified   Overall well-controlled Takes Xanax 0.5 mg nightly as needed      Relevant Medications   sertraline (ZOLOFT) 50 MG tablet   Anemia of CKD   Likely due to CKD Denies any active signs of bleeding currently Continue iron supplement - advised to take ferrous sulfate 325 mg BID now instead of QD      HLD (hyperlipidemia)   On statin Lipid profile reviewed      Relevant Medications   amlodipine-olmesartan (AZOR) 10-20 MG tablet   rosuvastatin (CRESTOR) 20 MG tablet   MDD (major depressive disorder), recurrent, in partial remission (HCC)   Well controlled with Zoloft 50 mg QD      Relevant Medications   sertraline (ZOLOFT) 50 MG tablet   Encounter for general adult medical examination with abnormal findings - Primary   Physical exam as documented. Fasting blood tests reviewed today. PAP smear with Ob/Gyn.       Meds ordered this encounter  Medications   amlodipine-olmesartan (AZOR) 10-20 MG tablet    Sig: Take 1 tablet by mouth daily.    Dispense:  90 tablet    Refill:  1   desloratadine (CLARINEX) 5 MG tablet    Sig: Take 1 tablet (5 mg total) by mouth daily.     Dispense:  30 tablet    Refill:  11    NA   RABEprazole (ACIPHEX) 20 MG tablet    Sig: Take 1 tablet (20 mg total) by mouth daily.    Dispense:  90 tablet    Refill:  1   rosuvastatin (CRESTOR) 20 MG tablet    Sig: Take 1 tablet (20 mg total) by mouth daily.    Dispense:  90 tablet    Refill:  1   sertraline (ZOLOFT) 50 MG tablet    Sig: Take 1 tablet (50 mg total) by mouth daily.    Dispense:  90 tablet    Refill:  1    Follow-up: Return in about 3 months (around 09/22/2023) for HTN.    Anabel Halon, MD

## 2023-06-24 ENCOUNTER — Other Ambulatory Visit: Payer: Self-pay | Admitting: Surgical

## 2023-07-01 ENCOUNTER — Inpatient Hospital Stay (HOSPITAL_COMMUNITY): Admission: RE | Admit: 2023-07-01 | Source: Ambulatory Visit

## 2023-07-08 ENCOUNTER — Ambulatory Visit (HOSPITAL_COMMUNITY)
Admission: RE | Admit: 2023-07-08 | Discharge: 2023-07-08 | Disposition: A | Discharge status: Home / Self Care | Source: Ambulatory Visit | Attending: Internal Medicine | Admitting: Internal Medicine

## 2023-07-08 DIAGNOSIS — M8080XS Other osteoporosis with current pathological fracture, unspecified site, sequela: Secondary | ICD-10-CM | POA: Insufficient documentation

## 2023-07-27 ENCOUNTER — Other Ambulatory Visit: Payer: Self-pay | Admitting: Internal Medicine

## 2023-07-27 DIAGNOSIS — J302 Other seasonal allergic rhinitis: Secondary | ICD-10-CM

## 2023-08-24 ENCOUNTER — Other Ambulatory Visit: Payer: Self-pay | Admitting: Internal Medicine

## 2023-08-24 DIAGNOSIS — J302 Other seasonal allergic rhinitis: Secondary | ICD-10-CM

## 2023-09-22 ENCOUNTER — Ambulatory Visit: Admitting: Internal Medicine

## 2023-09-23 ENCOUNTER — Other Ambulatory Visit: Payer: Self-pay | Admitting: Surgical

## 2023-09-23 ENCOUNTER — Other Ambulatory Visit: Payer: Self-pay | Admitting: Internal Medicine

## 2023-09-23 DIAGNOSIS — F411 Generalized anxiety disorder: Secondary | ICD-10-CM

## 2023-09-23 DIAGNOSIS — J4520 Mild intermittent asthma, uncomplicated: Secondary | ICD-10-CM

## 2023-10-22 ENCOUNTER — Other Ambulatory Visit: Payer: Self-pay | Admitting: Surgical

## 2023-10-29 ENCOUNTER — Other Ambulatory Visit: Payer: Self-pay | Admitting: Internal Medicine

## 2023-10-29 DIAGNOSIS — N1832 Chronic kidney disease, stage 3b: Secondary | ICD-10-CM

## 2023-10-29 DIAGNOSIS — E559 Vitamin D deficiency, unspecified: Secondary | ICD-10-CM

## 2023-11-07 ENCOUNTER — Ambulatory Visit: Admitting: Internal Medicine

## 2023-11-19 IMAGING — US US RENAL
1 series · 14 of 25 positions shown · non-contrast
Comparison: None.

CLINICAL DATA: 60-year-old female with chronic kidney disease.

EXAM:
RENAL / URINARY TRACT ULTRASOUND COMPLETE

[Series 1: us renal · 14 of 73 slices shown]
[im 1/73]
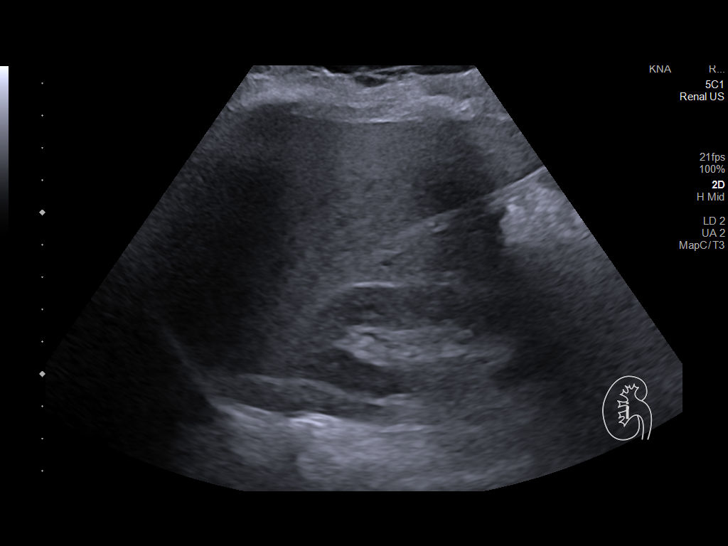
[im 7/73]
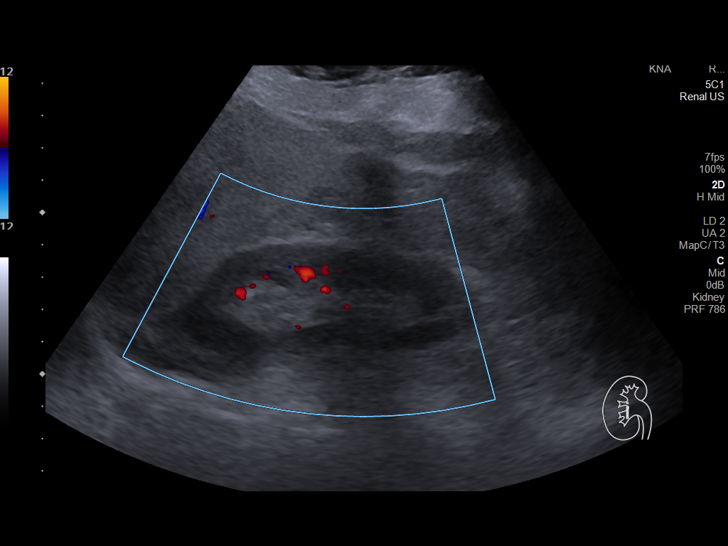
[im 13/73]
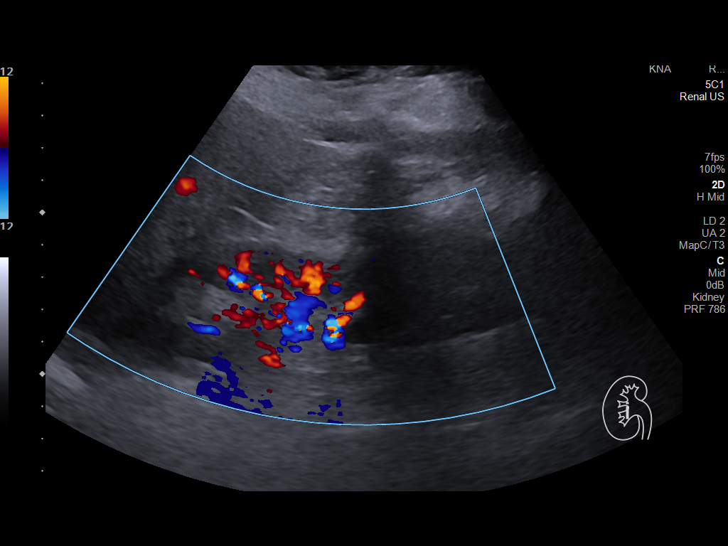
[im 19/73]
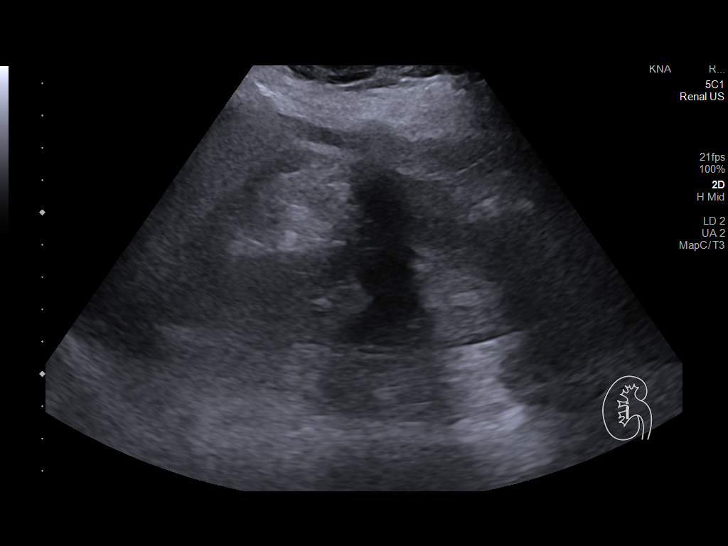
[im 25/73]
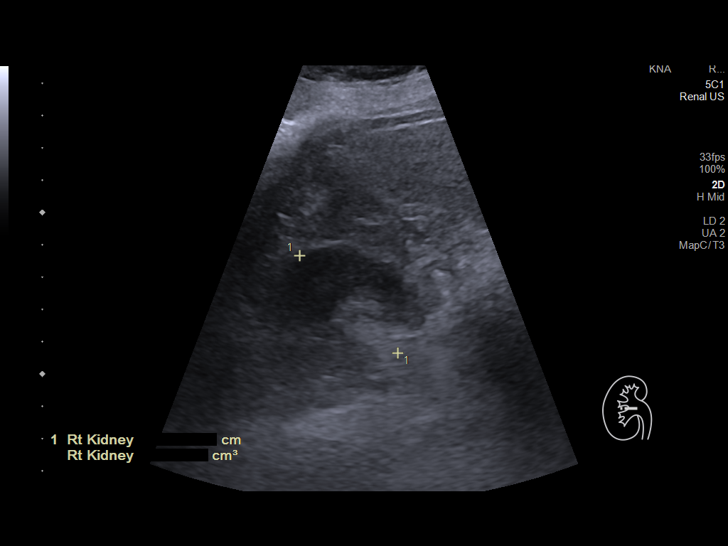
[im 28/73]
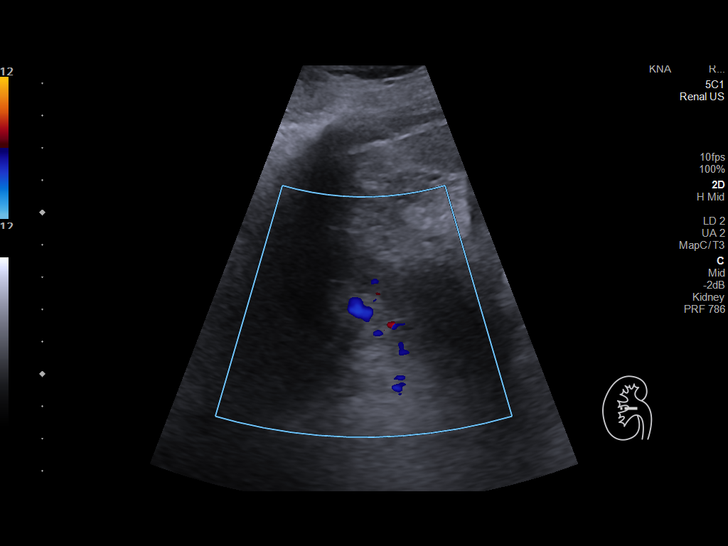
[im 34/73]
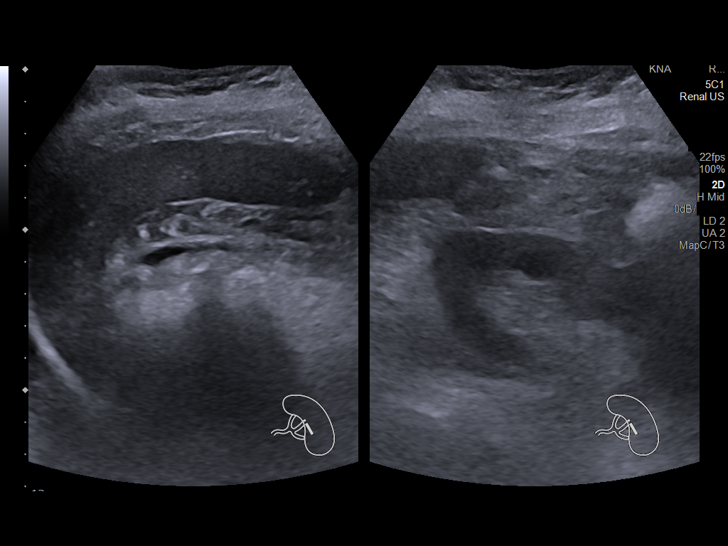
[im 40/73]
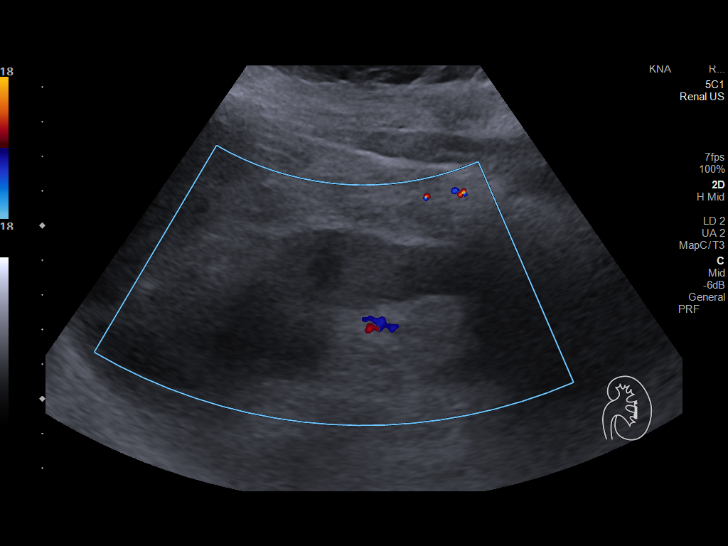
[im 46/73]
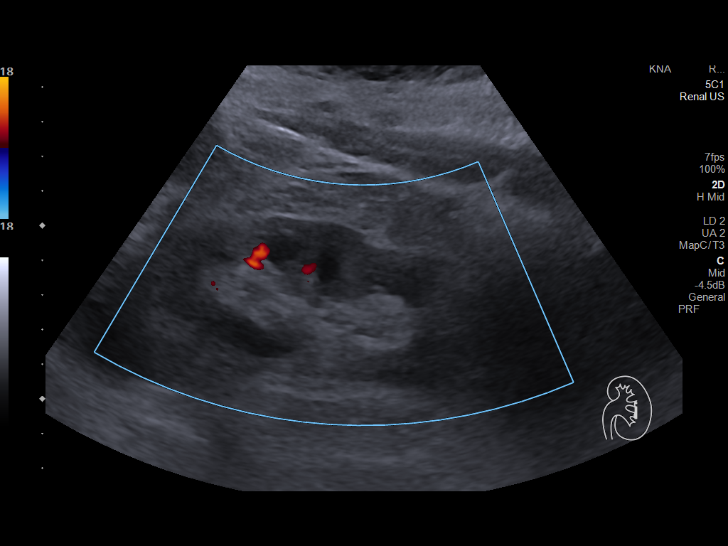
[im 49/73]
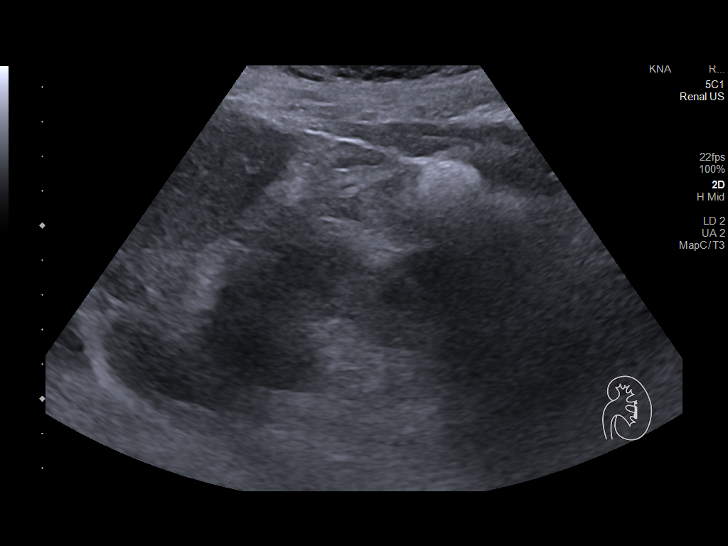
[im 55/73]
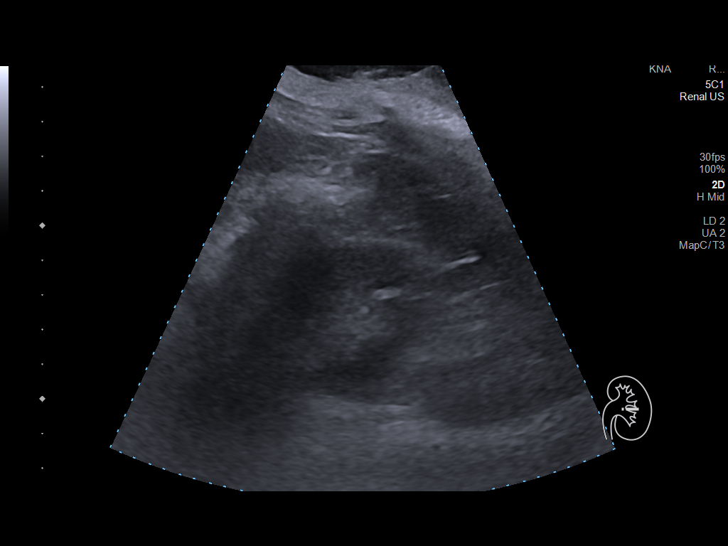
[im 61/73]
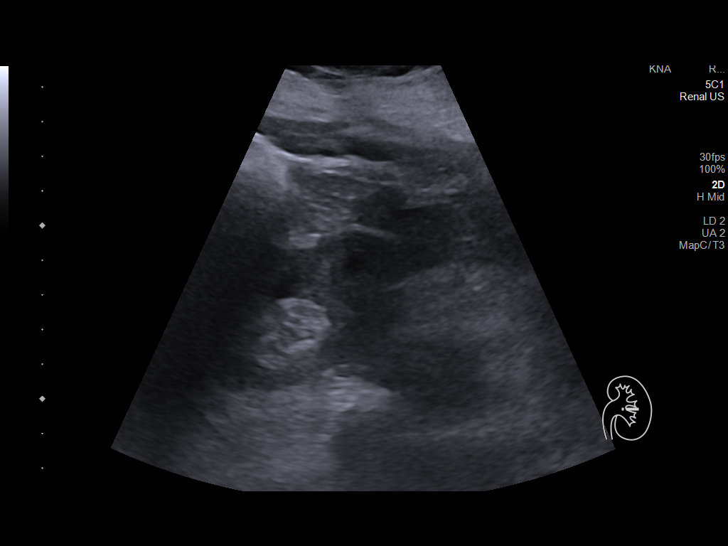
[im 67/73]
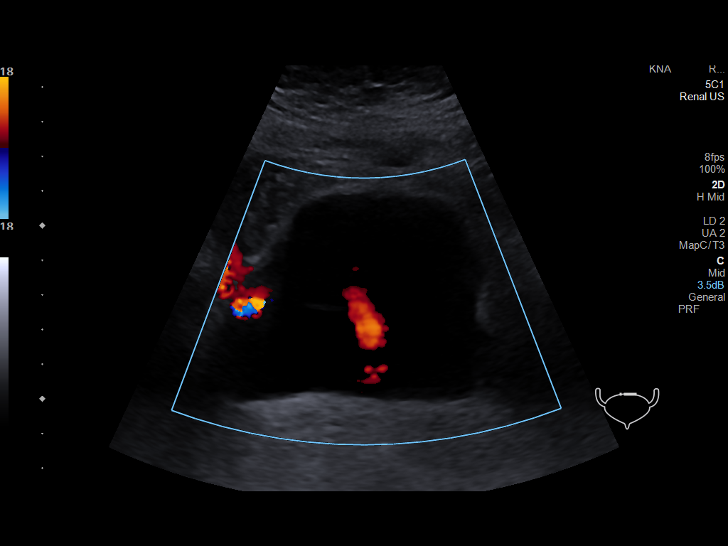
[im 73/73]
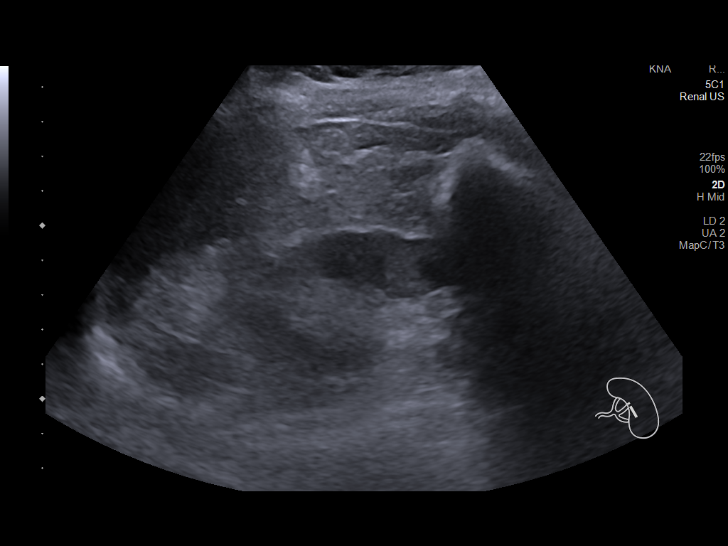

[14 of 25 positions shown; findings below may reference images not displayed]

FINDINGS: Right Kidney:

Renal measurements: 9.1 x 3.4 x 4.3 cm = volume: 69 mL. Echogenicity
within normal limits. No mass or hydronephrosis visualized.

Left Kidney:

Renal measurements: 9.2 x 4.4 x 4.2 cm = volume: 88 mL. Echogenicity
within normal limits. No mass or hydronephrosis visualized.

Bladder:

Appears normal for degree of bladder distention.

Other:

None.
IMPRESSION: 1. Small kidneys bilaterally by renal volume. Normal renal
echogenicity without hydronephrosis or solid mass.

## 2023-11-21 ENCOUNTER — Encounter: Payer: Self-pay | Admitting: Orthopedic Surgery

## 2023-11-21 DIAGNOSIS — M79652 Pain in left thigh: Secondary | ICD-10-CM

## 2023-11-21 NOTE — Telephone Encounter (Signed)
 Sure.  She could try.  Lets have them see her and see what they think.  Thanks

## 2023-11-29 ENCOUNTER — Encounter: Payer: Self-pay | Admitting: Physical Medicine and Rehabilitation

## 2023-11-29 ENCOUNTER — Ambulatory Visit: Admitting: Physical Medicine and Rehabilitation

## 2023-11-29 DIAGNOSIS — M79605 Pain in left leg: Secondary | ICD-10-CM

## 2023-11-29 DIAGNOSIS — M5442 Lumbago with sciatica, left side: Secondary | ICD-10-CM | POA: Diagnosis not present

## 2023-11-29 DIAGNOSIS — M5416 Radiculopathy, lumbar region: Secondary | ICD-10-CM | POA: Diagnosis not present

## 2023-11-29 DIAGNOSIS — M5441 Lumbago with sciatica, right side: Secondary | ICD-10-CM

## 2023-11-29 DIAGNOSIS — G8929 Other chronic pain: Secondary | ICD-10-CM

## 2023-11-29 NOTE — Progress Notes (Signed)
 Carol Maldonado - 63 y.o. female MRN [id number]  Date of birth: Apr 08, 1960  Office Visit Note: Visit Date: 11/29/2023 PCP: Tobie Suzzane POUR, MD Referred by: Tobie Suzzane POUR, MD  Subjective: Chief Complaint  Patient presents with   Lower Back - Pain   HPI: Carol Maldonado is a 63 y.o. female who comes in today per the request of Dr. Glendia Hutchinson for evaluation of chronic, worsening and severe bilateral lower back pain radiating down left anterior thigh to knee. Pain ongoing for several months, becomes severe with activity, household tasks such as cooking and cleaning. She describes pain and sharp and stabbing sensation, currently rates as 8 out of 10. Some relief of pain with home exercise regimen, rest and use of medications. History of left femur IM nailing with Dr. Hutchinson in 2024. She did attend PT post surgery for several months. Lumbar MRI imaging from 2024 shows degenerative facet spurring with mild anterolisthesis. Spurring is greater on the left. No severe nerve impingement, no high grade spinal canal stenosis. Patient denies focal weakness, numbness and tingling. No recent trauma or falls.       Review of Systems  Musculoskeletal:  Positive for back pain.  Neurological:  Negative for tingling, sensory change, focal weakness and weakness.  All other systems reviewed and are negative.  Otherwise per HPI.  Assessment & Plan: Visit Diagnoses:    ICD-10-CM   1. Chronic bilateral low back pain with bilateral sciatica  M54.42 Ambulatory referral to Physical Medicine Rehab   M54.41    G89.29     2. Lumbar radiculopathy  M54.16 Ambulatory referral to Physical Medicine Rehab    3. Pain in left leg  M79.605 Ambulatory referral to Physical Medicine Rehab       Plan: Findings:  Chronic, worsening and severe bilateral lower back pain radiating down left anterior thigh to knee. Patient continues to have pain despite good conservative therapies such as home exercise regimen, rest and use  of medications. Patients clinical presentation and exam are complex. Lumbar MRI imaging from 2024 does not show any structural issues that would directly correlate with her pain. We discussed treatment plan in detail today. Next step is to perform diagnostic and hopefully therapeutic left L4 transforaminal epidural steroid injection under fluoroscopic guidance. I discussed injection procedure with patient in detail today, she has no questions at this time. I will arrange for her to follow up with Dr. Hutchinson post injection for re-evaluation. No red flag symptoms noted upon exam today.     Meds & Orders: No orders of the defined types were placed in this encounter.   Orders Placed This Encounter  Procedures   Ambulatory referral to Physical Medicine Rehab    Follow-up: Return for Left L4 transforaminal epidural steroid injection.   Procedures: No procedures performed      Clinical History: CLINICAL DATA:  Left radicular leg pain   EXAM: MRI LUMBAR SPINE WITHOUT CONTRAST   TECHNIQUE: Multiplanar, multisequence MR imaging of the lumbar spine was performed. No intravenous contrast was administered.   COMPARISON:  11/28/2018   FINDINGS: Segmentation:  Standard.   Alignment:  Slight degenerative anterolisthesis at L4-5.   Vertebrae:  No fracture, evidence of discitis, or bone lesion.   Conus medullaris and cauda equina: Conus extends to the L2 level. Conus and cauda equina appear normal.   Paraspinal and other soft tissues: Negative for perispinal mass or inflammation.   Disc levels:   L2-L3: Ventral spondylitic spurring.   L4-L5:  Degenerative facet spurring with mild anterolisthesis. Spurring is greater on the left. Circumferential disc bulging. The canal and foramina are patent   L5-S1:Degenerative facet spurring. Mild disc bulging. Tiny central protrusion.   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 No convincing change from 2020 and no  neural impingement.     Electronically Signed   By: Dorn Roulette M.D.   On: 07/28/2022 20:19   She reports that she has never smoked. She has never used smokeless tobacco.  Recent Labs    06/15/23 1412  HGBA1C 5.9*    Objective:  VS:  HT:    WT:   BMI:     BP:   HR: bpm  TEMP: ( )  RESP:  Physical Exam Vitals and nursing note reviewed.  HENT:     Head: Normocephalic and atraumatic.     Right Ear: External ear normal.     Left Ear: External ear normal.     Nose: Nose normal.     Mouth/Throat:     Mouth: Mucous membranes are moist.  Eyes:     Extraocular Movements: Extraocular movements intact.  Cardiovascular:     Rate and Rhythm: Normal rate.     Pulses: Normal pulses.  Pulmonary:     Effort: Pulmonary effort is normal.  Abdominal:     General: Abdomen is flat. There is no distension.  Musculoskeletal:        General: Tenderness present.     Cervical back: Normal range of motion.     Comments: Patient rises from seated position to standing without difficulty. Good lumbar range of motion. No pain noted with facet loading. 5/5 strength noted with bilateral hip flexion, knee flexion/extension, ankle dorsiflexion/plantarflexion and EHL. No clonus noted bilaterally. No pain upon palpation of greater trochanters. No pain with internal/external rotation of bilateral hips. Sensation intact bilaterally. Dysesthesias noted to left L4 dermatome. Negative slump test bilaterally. Ambulates without aid, gait steady.     Skin:    General: Skin is warm and dry.     Capillary Refill: Capillary refill takes less than 2 seconds.  Neurological:     General: No focal deficit present.     Mental Status: She is alert and oriented to person, place, and time.  Psychiatric:        Mood and Affect: Mood normal.        Behavior: Behavior normal.     Ortho Exam  Imaging: No results found.  Past Medical/Family/Surgical/Social History: Medications & Allergies reviewed per EMR, new  medications updated. Patient Active Problem List   Diagnosis Date Noted   Secondary hyperparathyroidism of renal origin (HCC) 12/15/2022   Stress fracture of left femur 12/15/2022   Reactive airway disease 12/15/2022   Closed nondisplaced subtrochanteric fracture of femur with delayed healing 08/29/2022   GAD (generalized anxiety disorder) 06/10/2022   Hyperkalemia 11/20/2021   Encounter for general adult medical examination with abnormal findings 11/09/2021   Osteoporosis 03/19/2021   GERD (gastroesophageal reflux disease) 03/19/2021   HLD (hyperlipidemia) 03/19/2021   MDD (major depressive disorder), recurrent, in partial remission (HCC) 03/19/2021   Anemia of CKD 11/12/2020   Stage 3b chronic kidney disease (HCC) 08/05/2020   Colon cancer screening 06/23/2020   Insomnia, unspecified 03/03/2020   Essential hypertension, benign 03/03/2020   Seasonal allergic rhinitis 03/03/2020   Past Medical History:  Diagnosis Date   Allergy    [employer]reesia 02/29/2020   Anxiety    [employer]rees[employer] 02/29/2020   Hyperlipidemia  [employer]reesia 02/29/2020   Hypertension    [employer]reesia 02/29/2020   Insomnia    Osteoporosis    [employer]reesia 02/29/2020   Seasonal allergies    Family History  Problem Relation Age of Onset   Hypertension Mother    Heart attack Mother    Pneumonia Maternal Grandmother    Other Maternal Grandfather        MVA   Hypertension Father    High Cholesterol Father    Irritable bowel syndrome Sister    Irritable bowel syndrome Sister    Colon cancer Neg Hx    Past Surgical History:  Procedure Laterality Date   COLONOSCOPY  03/27/2011   Dr. Shaaron; entirely normal exam and recommended repeat in 10 years.   COLONOSCOPY WITH PROPOFOL  N/A 08/28/2020   Procedure: COLONOSCOPY WITH PROPOFOL ;  Surgeon: Shaaron Lamar HERO, MD;  Location: [facility];  Service: Endoscopy;  Laterality: N/A;  8:30am   FEMUR IM NAIL Left 08/10/2022   Procedure: LEFT FEMUR INTRAMEDULLARY (IM) NAIL;  Surgeon: Addie Cordella Hamilton, MD;  Location: [facility];  Service: Orthopedics;  Laterality: Left;   Social History   Occupational History   Not on file  Tobacco Use   Smoking status: Never   Smokeless tobacco: Never  Vaping Use   Vaping status: Never Used  Substance and Sexual Activity   Alcohol use: Never    Alcohol/week: 0.0 standard drinks of alcohol   Drug use: Never   Sexual activity: Not Currently    Birth control/protection: Post-menopausal

## 2023-11-29 NOTE — Progress Notes (Signed)
 Pain Scale   Average Pain 5 Patient advising she has lower back pain radiating to left leg( Post surgery on femur)         +Driver, -BT, -Dye Allergies.

## 2023-12-15 ENCOUNTER — Other Ambulatory Visit: Payer: Self-pay

## 2023-12-15 DIAGNOSIS — E559 Vitamin D deficiency, unspecified: Secondary | ICD-10-CM

## 2023-12-15 DIAGNOSIS — I10 Essential (primary) hypertension: Secondary | ICD-10-CM

## 2023-12-15 DIAGNOSIS — E782 Mixed hyperlipidemia: Secondary | ICD-10-CM

## 2023-12-15 DIAGNOSIS — E0789 Other specified disorders of thyroid: Secondary | ICD-10-CM

## 2023-12-15 DIAGNOSIS — R7303 Prediabetes: Secondary | ICD-10-CM

## 2023-12-16 LAB — CBC WITH DIFFERENTIAL/PLATELET
Basophils Absolute: 0 x10E3/uL (ref 0.0–0.2)
Basos: 0 %
EOS (ABSOLUTE): 0.2 x10E3/uL (ref 0.0–0.4)
Eos: 2 %
Hematocrit: 32.4 % — ABNORMAL LOW (ref 34.0–46.6)
Hemoglobin: 10 g/dL — ABNORMAL LOW (ref 11.1–15.9)
Immature Grans (Abs): 0 x10E3/uL (ref 0.0–0.1)
Immature Granulocytes: 0 %
Lymphocytes Absolute: 1.1 x10E3/uL (ref 0.7–3.1)
Lymphs: 14 %
MCH: 27.7 pg (ref 26.6–33.0)
MCHC: 30.9 g/dL — ABNORMAL LOW (ref 31.5–35.7)
MCV: 90 fL (ref 79–97)
Monocytes Absolute: 0.5 x10E3/uL (ref 0.1–0.9)
Monocytes: 7 %
Neutrophils Absolute: 6.5 x10E3/uL (ref 1.4–7.0)
Neutrophils: 77 %
Platelets: 215 x10E3/uL (ref 150–450)
RBC: 3.61 x10E6/uL — ABNORMAL LOW (ref 3.77–5.28)
RDW: 13.5 % (ref 11.7–15.4)
WBC: 8.4 x10E3/uL (ref 3.4–10.8)

## 2023-12-16 LAB — CMP14+EGFR
ALT: 13 IU/L (ref 0–32)
AST: 18 IU/L (ref 0–40)
Albumin: 4.6 g/dL (ref 3.9–4.9)
Alkaline Phosphatase: 119 IU/L (ref 49–135)
BUN/Creatinine Ratio: 15 (ref 12–28)
BUN: 25 mg/dL (ref 8–27)
Bilirubin Total: 0.2 mg/dL (ref 0.0–1.2)
CO2: 18 mmol/L — ABNORMAL LOW (ref 20–29)
Calcium: 9.7 mg/dL (ref 8.7–10.3)
Chloride: 102 mmol/L (ref 96–106)
Creatinine, Ser: 1.67 mg/dL — ABNORMAL HIGH (ref 0.57–1.00)
Globulin, Total: 2.8 g/dL (ref 1.5–4.5)
Glucose: 105 mg/dL — ABNORMAL HIGH (ref 70–99)
Potassium: 5.7 mmol/L — ABNORMAL HIGH (ref 3.5–5.2)
Sodium: 135 mmol/L (ref 134–144)
Total Protein: 7.4 g/dL (ref 6.0–8.5)
eGFR: 34 mL/min/1.73 — ABNORMAL LOW (ref >59)

## 2023-12-16 LAB — LIPID PANEL
Chol/HDL Ratio: 2.2 ratio (ref 0.0–4.4)
Cholesterol, Total: 114 mg/dL (ref 100–199)
HDL: 51 mg/dL (ref >39)
LDL Chol Calc (NIH): 44 mg/dL (ref 0–99)
Triglycerides: 99 mg/dL (ref 0–149)
VLDL Cholesterol Cal: 19 mg/dL (ref 5–40)

## 2023-12-16 LAB — TSH: TSH: 1.62 u[IU]/mL (ref 0.450–4.500)

## 2023-12-16 LAB — HEMOGLOBIN A1C
Est. average glucose Bld gHb Est-mCnc: 157 mg/dL
Hgb A1c MFr Bld: 7.1 % — ABNORMAL HIGH (ref 4.8–5.6)

## 2023-12-16 LAB — VITAMIN D 25 HYDROXY (VIT D DEFICIENCY, FRACTURES): Vit D, 25-Hydroxy: 52.6 ng/mL (ref 30.0–100.0)

## 2023-12-22 ENCOUNTER — Ambulatory Visit: Admitting: Internal Medicine

## 2023-12-22 ENCOUNTER — Encounter: Payer: Self-pay | Admitting: Internal Medicine

## 2023-12-22 VITALS — BP 128/85 | HR 91 | Ht 61.0 in | Wt 173.0 lb

## 2023-12-22 DIAGNOSIS — I10 Essential (primary) hypertension: Secondary | ICD-10-CM

## 2023-12-22 DIAGNOSIS — E1165 Type 2 diabetes mellitus with hyperglycemia: Secondary | ICD-10-CM | POA: Diagnosis not present

## 2023-12-22 DIAGNOSIS — N2581 Secondary hyperparathyroidism of renal origin: Secondary | ICD-10-CM | POA: Diagnosis not present

## 2023-12-22 DIAGNOSIS — F3341 Major depressive disorder, recurrent, in partial remission: Secondary | ICD-10-CM

## 2023-12-22 DIAGNOSIS — M5416 Radiculopathy, lumbar region: Secondary | ICD-10-CM

## 2023-12-22 DIAGNOSIS — E782 Mixed hyperlipidemia: Secondary | ICD-10-CM

## 2023-12-22 DIAGNOSIS — Z23 Encounter for immunization: Secondary | ICD-10-CM | POA: Diagnosis not present

## 2023-12-22 DIAGNOSIS — N1832 Chronic kidney disease, stage 3b: Secondary | ICD-10-CM | POA: Diagnosis not present

## 2023-12-22 DIAGNOSIS — G4700 Insomnia, unspecified: Secondary | ICD-10-CM

## 2023-12-22 MED ORDER — TIRZEPATIDE 2.5 MG/0.5ML ~~LOC~~ SOAJ: 2.5000 mg | SUBCUTANEOUS | 2 refills | Status: DC

## 2023-12-22 MED ORDER — AMLODIPINE-OLMESARTAN 10-20 MG PO TABS: 1.0000 | ORAL_TABLET | Freq: Every day | ORAL | 1 refills | Status: AC

## 2023-12-22 MED ORDER — BLOOD GLUCOSE MONITORING SUPPL DEVI
1.0000 | Freq: Three times a day (TID) | 0 refills | Status: AC
Start: 2023-12-22 — End: ?

## 2023-12-22 MED ORDER — BLOOD GLUCOSE TEST VI STRP
1.0000 | ORAL_STRIP | Freq: Three times a day (TID) | 2 refills | Status: AC
Start: 2023-12-22 — End: ?

## 2023-12-22 MED ORDER — ROSUVASTATIN CALCIUM 20 MG PO TABS: 20.0000 mg | ORAL_TABLET | Freq: Every day | ORAL | 1 refills | Status: AC

## 2023-12-22 MED ORDER — SERTRALINE HCL 50 MG PO TABS: 50.0000 mg | ORAL_TABLET | Freq: Every day | ORAL | 1 refills | Status: AC

## 2023-12-22 MED ORDER — LANCETS MISC. MISC: 1.0000 | Freq: Three times a day (TID) | 0 refills | Status: AC

## 2023-12-22 NOTE — Assessment & Plan Note (Signed)
 BP Readings from Last 1 Encounters:  12/22/23 128/85   Usually well-controlled with Amlodipine -Olmesartan  10-20 mg QD now Advised to check BP at home and bring the log in the next visit Counseled for compliance with the medications Advised DASH diet and moderate exercise/walking, at least 150 mins/week

## 2023-12-22 NOTE — Assessment & Plan Note (Addendum)
 Last BMP reviewed, stable GFR now - 34, worse than her baseline Advised to maintain at least 64 ounces of fluid intake in a day Avoid nephrotoxic agents On ARB Followed by nephrology

## 2023-12-22 NOTE — Patient Instructions (Signed)
 Please start taking Mounjaro as prescribed.  Please continue to take medications as prescribed.  Please continue to follow low carb diet and perform moderate exercise/walking at least 150 mins/week.

## 2023-12-22 NOTE — Assessment & Plan Note (Signed)
 Overall well-controlled Takes Xanax 0.5 mg nightly as needed

## 2023-12-22 NOTE — Assessment & Plan Note (Addendum)
 Has chronic low back, left hip and thigh pain Has had stress fracture of left femur, status post intramedullary nail placement, followed by orthopedic surgery She is also followed by PM&R specialist - planned to get epidural injection for lumbar radiculopathy Avoid heavy lifting and frequent bending Gabapentin  300 mg nightly

## 2023-12-22 NOTE — Assessment & Plan Note (Signed)
On statin Lipid profile reviewed 

## 2023-12-22 NOTE — Assessment & Plan Note (Signed)
 Lab Results  Component Value Date   HGBA1C 7.1 (H) 12/15/2023   New onset Unable to take metformin due to CKD Started Mounjaro  2.5 mg qw, plan to increase dose as tolerated Advised to follow diabetic diet On statin and ARB F/u CMP and lipid panel Diabetic eye exam: Advised to follow up with Ophthalmology for diabetic eye exam

## 2023-12-22 NOTE — Assessment & Plan Note (Signed)
 Well-controlled with Zoloft 50 mg QD

## 2023-12-22 NOTE — Assessment & Plan Note (Addendum)
 Lab Results  Component Value Date   PTH 91 (H) 12/09/2022   CALCIUM  9.7 12/15/2023   Likely due to CKD On [place]  for Vitamin D  deficiency

## 2023-12-22 NOTE — Progress Notes (Signed)
 Established Patient Office Visit  Subjective:  Patient ID: Carol Maldonado, female    DOB: [birth year]  Age: 62 y.o. MRN: [id number]  CC:  Chief Complaint  Patient presents with   Hypertension    Six month follow up    HPI Carol Maldonado is a 63 y.o. female with past medical history of HTN, GERD, osteoporosis, CKD stage IIIb, depression with insomnia and HLD who presents for annual physical.  HTN: BP is WNL today. Takes medications regularly. Patient denies headache, dizziness, chest pain, dyspnea or palpitations.  CKD stage 3b: BMP reviewed from chart, GFR at 34, worse compared to prior.  She has h/o hyperkalemia, potassium at 5.7.  She had nephrology visit after it. She has cut down potassium rich food. She denies any dysuria, hematuria, urinary hesitance or resistance.  She is seeing Nephrology.  She is on ARB and vitamin D  supplements currently. Her Hb was 10.0, overall stable compared to prior. She denies any signs of bleeding, such as melena, hematochezia or hematuria.  Type II DM: Her HbA1c was 7.1 recently, increased from 5.9 in 03/25.  She admits that she has been eating more at nighttime while watching TV, especially ice cream.  She has gained about 8 lbs since the last visit. She agrees to improve her diet.  Denies dysuria, hematuria, polyuria currently.  Stress fracture of femur: She had stress fracture of femur, s/p left femur intramedullary nail placement on 08/10/22.  She was told that it was due to Fosamax  use (2020-2024).  She has stopped taking Fosamax  for osteoporosis. She has also stopped OTC calcium  and vitamin D  supplement.  She takes Rayaldee  for vitamin D  deficiency and CKD. She still has left hip pain, which is slowly improving.  She has gabapentin  for neuropathic pain.  She prefers to take Tylenol  instead for hip pain.  She takes Rabeprazole  for GERD. Denies any dysphagia or odynophagia.  She has been taking Zoloft  for depression and takes Xanax  at bedtime for  insomnia.  Denies any anhedonia, SI or HI.  Past Medical History:  Diagnosis Date   Allergy    Phreesia 02/29/2020   Anxiety    Phreesia 02/29/2020   Hyperlipidemia    Phreesia 02/29/2020   Hypertension    Phreesia 02/29/2020   Insomnia    Osteoporosis    Phreesia 02/29/2020   Seasonal allergies     Past Surgical History:  Procedure Laterality Date   COLONOSCOPY  03/27/2011   Dr. Shaaron; entirely normal exam and recommended repeat in 10 years.   COLONOSCOPY WITH PROPOFOL  N/A 08/28/2020   Procedure: COLONOSCOPY WITH PROPOFOL ;  Surgeon: Shaaron Lamar HERO, MD;  Location: [facility];  Service: Endoscopy;  Laterality: N/A;  8:30am   FEMUR IM NAIL Left 08/10/2022   Procedure: LEFT FEMUR INTRAMEDULLARY (IM) NAIL;  Surgeon: Addie Cordella Hamilton, MD;  Location: [facility];  Service: Orthopedics;  Laterality: Left;    Family History  Problem Relation Age of Onset   Hypertension Mother    Heart attack Mother    Pneumonia Maternal Grandmother    Other Maternal Grandfather        MVA   Hypertension Father    High Cholesterol Father    Irritable bowel syndrome Sister    Irritable bowel syndrome Sister    Colon cancer Neg Hx     Social History   Socioeconomic History   Marital status: Single    Spouse name: Not on file   Number of children: Not on  file   Years of education: Not on file   Highest education level: Not on file  Occupational History   Not on file  Tobacco Use   Smoking status: Never   Smokeless tobacco: Never  Vaping Use   Vaping status: Never Used  Substance and Sexual Activity   Alcohol use: Never    Alcohol/week: 0.0 standard drinks of alcohol   Drug use: Never   Sexual activity: Not Currently    Birth control/protection: Post-menopausal  Other Topics Concern   Not on file  Social History Narrative   Not on file   Social Drivers of Health   Financial Resource Strain: Low Risk  (03/04/2022)   Overall Financial Resource Strain (CARDIA)    Difficulty of  Paying Living Expenses: Not hard at all  Food Insecurity: No Food Insecurity (03/04/2022)   Hunger Vital Sign    Worried About Running Out of Food in the Last Year: Never true    Ran Out of Food in the Last Year: Never true  Transportation Needs: No Transportation Needs (03/04/2022)   PRAPARE - Administrator, Civil Service (Medical): No    Lack of Transportation (Non-Medical): No  Physical Activity: Insufficiently Active (03/04/2022)   Exercise Vital Sign    Days of Exercise per Week: 5 days    Minutes of Exercise per Session: 10 min  Stress: No Stress Concern Present (03/04/2022)   [employer] of Occupational Health - Occupational Stress Questionnaire    Feeling of Stress : Not at all  Social Connections: Moderately Integrated (03/04/2022)   Social Connection and Isolation Panel    Frequency of Communication with Friends and Family: More than three times a week    Frequency of Social Gatherings with Friends and Family: Once a week    Attends Religious Services: More than 4 times per year    Active Member of [employer] or Organizations: Yes    Attends Engineer, structural: More than 4 times per year    Marital Status: Never married  Intimate Partner Violence: Not At Risk (03/04/2022)   Humiliation, Afraid, Rape, and Kick questionnaire    Fear of Current or Ex-Partner: No    Emotionally Abused: No    Physically Abused: No    Sexually Abused: No    Outpatient Medications Prior to Visit  Medication Sig Dispense Refill   albuterol  (VENTOLIN  HFA) 108 (90 Base) MCG/ACT inhaler inhale 2 puffs into the lungs every 6 (six) hours as needed. 18 g 2   ALPRAZolam  (XANAX ) 0.5 MG tablet take 1 tablet by mouth at bedtime. 30 tablet 3   azelastine  (ASTELIN ) 0.1 % nasal spray place 1 spray into both nostrils 2 (two) times daily. use in each nostril as directed 30 mL 5   desloratadine  (CLARINEX ) 5 MG tablet Take 1 tablet (5 mg total) by mouth daily. 30 tablet 11   flunisolide   (NASALIDE ) 25 MCG/ACT (0.025%) SOLN place 2 sprays into the nose 2 (two) times daily. 25 mL 5   gabapentin  (NEURONTIN ) 300 MG capsule take 1 capsule (300 milligram total) by mouth 3 (three) times daily as needed. 60 capsule 2   HYDROmorphone  (DILAUDID ) 2 MG tablet Take 0.5 tablets (1 mg total) by mouth every 8 (eight) hours as needed for severe pain. 15 tablet 0   Naphazoline-Pheniramine (OPCON-A) 0.027-0.315 % SOLN Place 1 drop into both eyes 3 (three) times daily as needed (allergy eyes.).     Omega-3 Fatty Acids (FISH OIL) 1000 MG CAPS Take  1,000 mg by mouth in the morning.     RABEprazole  (ACIPHEX ) 20 MG tablet Take 1 tablet (20 mg total) by mouth daily. 90 tablet 1   RAYALDEE  30 MCG CPCR TAKE 1 CAPS BY MOUTH 2 TIMES A WEEK. TAKE 1 CAPSULE BY MOUTH ON MONDAYS & FRIDAYS IN THE EVENING. 8 capsule 5   amlodipine -olmesartan  (AZOR ) 10-20 MG tablet Take 1 tablet by mouth daily. 90 tablet 1   rosuvastatin  (CRESTOR ) 20 MG tablet Take 1 tablet (20 mg total) by mouth daily. 90 tablet 1   sertraline  (ZOLOFT ) 50 MG tablet Take 1 tablet (50 mg total) by mouth daily. 90 tablet 1   No facility-administered medications prior to visit.    Allergies  Allergen Reactions   Amoxicillin Hives, Itching and Rash   Codeine Hives, Itching and Rash   Hydrocodone Rash   Keflex [Cephalexin] Rash   Penicillins Rash   Sulfa Antibiotics Rash    ROS Review of Systems  Constitutional:  Negative for chills and fever.  HENT:  Negative for congestion, sinus pressure, sinus pain and sore throat.   Eyes:  Negative for pain and discharge.  Respiratory:  Negative for cough and shortness of breath.   Cardiovascular:  Negative for chest pain and palpitations.  Gastrointestinal:  Negative for abdominal pain, diarrhea, nausea and vomiting.  Endocrine: Negative for polydipsia and polyuria.  Genitourinary:  Negative for dysuria and hematuria.  Musculoskeletal:  Positive for arthralgias (Left hip). Negative for neck pain and  neck stiffness.  Skin:  Negative for rash.  Neurological:  Negative for dizziness and weakness.  Psychiatric/Behavioral:  Positive for sleep disturbance. Negative for agitation and behavioral problems. The patient is nervous/anxious.       Objective:    Physical Exam Vitals reviewed.  Constitutional:      General: She is not in acute distress.    Appearance: She is not diaphoretic.     Comments: Has a cane for walking support  HENT:     Head: Normocephalic and atraumatic.     Nose: Nose normal. No congestion.     Mouth/Throat:     Mouth: Mucous membranes are moist.     Pharynx: No posterior oropharyngeal erythema.  Eyes:     General: No scleral icterus.    Extraocular Movements: Extraocular movements intact.  Cardiovascular:     Rate and Rhythm: Normal rate and regular rhythm.     Heart sounds: Normal heart sounds. No murmur heard. Pulmonary:     Breath sounds: Normal breath sounds. No wheezing or rales.  Musculoskeletal:     Cervical back: Neck supple. No tenderness.     Right hip: Decreased range of motion.     Left hip: Tenderness present.     Left knee: Normal range of motion. No tenderness.     Right lower leg: No edema.     Left lower leg: No edema.  Skin:    General: Skin is warm.     Findings: No rash.  Neurological:     General: No focal deficit present.     Mental Status: She is alert and oriented to person, place, and time.     Sensory: No sensory deficit.     Motor: No weakness.  Psychiatric:        Mood and Affect: Mood normal.        Behavior: Behavior normal.     BP 128/85   Pulse 91   Ht 5' 1 (1.549 m)   Wt 173 lb (  78.5 kg)   SpO2 98%   BMI 32.69 kg/m  Wt Readings from Last 3 Encounters:  12/22/23 173 lb (78.5 kg)  06/22/23 165 lb (74.8 kg)  12/15/22 154 lb 9.6 oz (70.1 kg)    Lab Results  Component Value Date   TSH 1.620 12/15/2023   Lab Results  Component Value Date   WBC 8.4 12/15/2023   HGB 10.0 (L) 12/15/2023   HCT 32.4 (L)  12/15/2023   MCV 90 12/15/2023   PLT 215 12/15/2023   Lab Results  Component Value Date   NA 135 12/15/2023   K 5.7 (H) 12/15/2023   CO2 18 (L) 12/15/2023   GLUCOSE 105 (H) 12/15/2023   BUN 25 12/15/2023   CREATININE 1.67 (H) 12/15/2023   BILITOT 0.2 12/15/2023   ALKPHOS 119 12/15/2023   AST 18 12/15/2023   ALT 13 12/15/2023   PROT 7.4 12/15/2023   ALBUMIN 4.6 12/15/2023   CALCIUM  9.7 12/15/2023   ANIONGAP 8 08/10/2022   EGFR 34 (L) 12/15/2023   Lab Results  Component Value Date   CHOL 114 12/15/2023   Lab Results  Component Value Date   HDL 51 12/15/2023   Lab Results  Component Value Date   LDLCALC 44 12/15/2023   Lab Results  Component Value Date   TRIG 99 12/15/2023   Lab Results  Component Value Date   CHOLHDL 2.2 12/15/2023   Lab Results  Component Value Date   HGBA1C 7.1 (H) 12/15/2023      Assessment & Plan:   Problem List Items Addressed This Visit       Cardiovascular and Mediastinum   Essential hypertension, benign   BP Readings from Last 1 Encounters:  12/22/23 128/85   Usually well-controlled with Amlodipine -Olmesartan  10-20 mg QD now Advised to check BP at home and bring the log in the next visit Counseled for compliance with the medications Advised DASH diet and moderate exercise/walking, at least 150 mins/week      Relevant Medications   amlodipine -olmesartan  (AZOR ) 10-20 MG tablet   rosuvastatin  (CRESTOR ) 20 MG tablet     Endocrine   Secondary hyperparathyroidism of renal origin   Lab Results  Component Value Date   PTH 91 (H) 12/09/2022   CALCIUM  9.7 12/15/2023   Likely due to CKD On Rayaldee  for Vitamin D  deficiency      Type 2 diabetes mellitus with hyperglycemia, without long-term current use of insulin (HCC) - Primary   Lab Results  Component Value Date   HGBA1C 7.1 (H) 12/15/2023   New onset Unable to take metformin due to CKD Started Mounjaro  2.5 mg qw, plan to increase dose as tolerated Advised to follow  diabetic diet On statin and ARB F/u CMP and lipid panel Diabetic eye exam: Advised to follow up with Ophthalmology for diabetic eye exam      Relevant Medications   tirzepatide  (MOUNJARO ) 2.5 MG/0.5ML Pen   Blood Glucose Monitoring Suppl DEVI   Glucose Blood (BLOOD GLUCOSE TEST STRIPS) STRP   Lancets Misc. MISC   amlodipine -olmesartan  (AZOR ) 10-20 MG tablet   rosuvastatin  (CRESTOR ) 20 MG tablet     Nervous and Auditory   Lumbar radiculopathy   Has chronic low back, left hip and thigh pain Has had stress fracture of left femur, status post intramedullary nail placement, followed by orthopedic surgery She is also followed by PM&R specialist - planned to get epidural injection for lumbar radiculopathy Avoid heavy lifting and frequent bending Gabapentin  300 mg nightly  Relevant Medications   sertraline  (ZOLOFT ) 50 MG tablet     Genitourinary   Stage 3b chronic kidney disease (HCC)   Last BMP reviewed, stable GFR now - 34, worse than her baseline Advised to maintain at least 64 ounces of fluid intake in a day Avoid nephrotoxic agents On ARB Followed by nephrology        Other   Insomnia, unspecified   Overall well-controlled Takes Xanax  0.5 mg nightly as needed      Relevant Medications   sertraline  (ZOLOFT ) 50 MG tablet   HLD (hyperlipidemia)   On statin Lipid profile reviewed      Relevant Medications   amlodipine -olmesartan  (AZOR ) 10-20 MG tablet   rosuvastatin  (CRESTOR ) 20 MG tablet   MDD (major depressive disorder), recurrent, in partial remission   Well controlled with Zoloft  50 mg QD      Relevant Medications   sertraline  (ZOLOFT ) 50 MG tablet   Other Visit Diagnoses       Encounter for immunization       Relevant Orders   Flu vaccine HIGH DOSE PF(Fluzone Trivalent) (Completed)     Encounter for immunization       Relevant Orders   Pneumococcal conjugate vaccine 20-valent (Completed)        Meds ordered this encounter  Medications    tirzepatide  (MOUNJARO ) 2.5 MG/0.5ML Pen    Sig: Inject 2.5 mg into the skin once a week.    Dispense:  2 mL    Refill:  2   Blood Glucose Monitoring Suppl DEVI    Sig: 1 each by Does not apply route in the morning, at noon, and at bedtime. May substitute to any manufacturer covered by patient's insurance.    Dispense:  1 each    Refill:  0   Glucose Blood (BLOOD GLUCOSE TEST STRIPS) STRP    Sig: 1 each by Does not apply route in the morning, at noon, and at bedtime. May substitute to any manufacturer covered by patient's insurance.    Dispense:  100 strip    Refill:  2   Lancets Misc. MISC    Sig: 1 each by Does not apply route in the morning, at noon, and at bedtime. May substitute to any manufacturer covered by patient's insurance.    Dispense:  100 each    Refill:  0   amlodipine -olmesartan  (AZOR ) 10-20 MG tablet    Sig: Take 1 tablet by mouth daily.    Dispense:  90 tablet    Refill:  1   rosuvastatin  (CRESTOR ) 20 MG tablet    Sig: Take 1 tablet (20 mg total) by mouth daily.    Dispense:  90 tablet    Refill:  1   sertraline  (ZOLOFT ) 50 MG tablet    Sig: Take 1 tablet (50 mg total) by mouth daily.    Dispense:  90 tablet    Refill:  1    Follow-up: Return in about 3 months (around 03/22/2024) for DM.    Suzzane MARLA Blanch, MD

## 2023-12-23 ENCOUNTER — Telehealth: Payer: Self-pay | Admitting: Pharmacy Technician

## 2023-12-23 ENCOUNTER — Other Ambulatory Visit: Payer: Self-pay | Admitting: Surgical

## 2023-12-23 ENCOUNTER — Other Ambulatory Visit: Payer: Self-pay | Admitting: Internal Medicine

## 2023-12-23 ENCOUNTER — Other Ambulatory Visit (HOSPITAL_COMMUNITY): Payer: Self-pay

## 2023-12-23 DIAGNOSIS — K219 Gastro-esophageal reflux disease without esophagitis: Secondary | ICD-10-CM

## 2023-12-23 DIAGNOSIS — J4520 Mild intermittent asthma, uncomplicated: Secondary | ICD-10-CM

## 2023-12-23 NOTE — Telephone Encounter (Signed)
 Pharmacy Patient Advocate Encounter   Received notification from CoverMyMeds that prior authorization for Mounjaro  2.5MG /0.5ML auto-injectors is required/requested.   Insurance verification completed.   The patient is insured through [facility] .   Per test claim: PA required; PA submitted to above mentioned insurance via Latent Key/confirmation #/EOC [id number] Status is pending

## 2023-12-23 NOTE — Telephone Encounter (Signed)
 Pharmacy Patient Advocate Encounter  Received notification from [employer] that Prior Authorization for Mounjaro  2.5MG /0.5ML auto-injectors has been APPROVED from 12/23/2023 to 12/22/2024. Ran test claim, Copay is $314.02. This test claim was processed through [facility]- copay amounts may vary at other pharmacies due to pharmacy/plan contracts, or as the patient moves through the different stages of their insurance plan.   PA #/Case ID/Reference #: [id number]

## 2023-12-27 ENCOUNTER — Encounter: Payer: Self-pay | Admitting: Internal Medicine

## 2023-12-27 MED ORDER — TIRZEPATIDE 2.5 MG/0.5ML ~~LOC~~ SOAJ: 2.5000 mg | SUBCUTANEOUS | 2 refills | Status: DC

## 2023-12-27 NOTE — Addendum Note (Signed)
 Addended byBETHA TOBIE DOWNS on: 12/27/2023 07:57 AM   Modules accepted: Orders

## 2023-12-28 ENCOUNTER — Ambulatory Visit: Admitting: Physical Medicine and Rehabilitation

## 2023-12-28 ENCOUNTER — Other Ambulatory Visit: Payer: Self-pay

## 2023-12-28 VITALS — BP 149/90 | HR 79

## 2023-12-28 DIAGNOSIS — M5416 Radiculopathy, lumbar region: Secondary | ICD-10-CM | POA: Diagnosis not present

## 2023-12-28 MED ORDER — METHYLPREDNISOLONE ACETATE 80 MG/ML IJ SUSP
40.0000 mg | Freq: Once | INTRAMUSCULAR | Status: AC
  Administered 2023-12-28: 40 mg

## 2023-12-28 NOTE — Progress Notes (Signed)
 Pain Scale   Average Pain 7 Patient advising she has lower back pain radiating to left leg at times when doing daily chores. Pain decreases when resting        +Driver, -BT, -Dye Allergies.

## 2024-01-02 ENCOUNTER — Encounter (INDEPENDENT_AMBULATORY_CARE_PROVIDER_SITE_OTHER): Payer: Self-pay

## 2024-01-08 NOTE — Progress Notes (Signed)
 Carol Maldonado - 63 y.o. female MRN [id number]  Date of birth: 1961/01/16  Office Visit Note: Visit Date: 12/28/2023 PCP: Tobie Suzzane POUR, MD Referred by: Tobie Suzzane POUR, MD  Subjective: Chief Complaint  Patient presents with   Lower Back - Pain   HPI:  Carol Maldonado is a 63 y.o. female who comes in today at the request of Duwaine Pouch, FNP for planned Left L4-5 Lumbar Transforaminal epidural steroid injection with fluoroscopic guidance.  The patient has failed conservative care including home exercise, medications, time and activity modification.  This injection will be diagnostic and hopefully therapeutic.  Please see requesting physician notes for further details and justification.   ROS Otherwise per HPI.  Assessment & Plan: Visit Diagnoses:    ICD-10-CM   1. Lumbar radiculopathy  M54.16 XR C-ARM NO REPORT    Epidural Steroid injection    methylPREDNISolone acetate (DEPO-MEDROL) injection 40 mg      Plan: No additional findings.   Meds & Orders:  Meds ordered this encounter  Medications   methylPREDNISolone acetate (DEPO-MEDROL) injection 40 mg    Orders Placed This Encounter  Procedures   XR C-ARM NO REPORT   Epidural Steroid injection    Follow-up: Return for visit to requesting provider as needed.   Procedures: No procedures performed  Lumbosacral Transforaminal Epidural Steroid Injection - Sub-Pedicular Approach with Fluoroscopic Guidance  Patient: Carol Maldonado      Date of Birth: 02-11-1961 MRN: [id number] PCP: Tobie Suzzane POUR, MD      Visit Date: 12/28/2023   Universal Protocol:    Date/Time: 12/28/2023  Consent Given By: the patient  Position: PRONE  Additional Comments: Vital signs were monitored before and after the procedure. Patient was prepped and draped in the usual sterile fashion. The correct patient, procedure, and site was verified.   Injection Procedure Details:   Procedure diagnoses: Lumbar radiculopathy [M54.16]     Meds Administered:  Meds ordered this encounter  Medications   methylPREDNISolone acetate (DEPO-MEDROL) injection 40 mg    Laterality: Left  Location/Site: L4  Needle:5.0 in., 22 ga.  Short bevel or Quincke spinal needle  Needle Placement: Transforaminal  Findings:    -Comments: Excellent flow of contrast along the nerve, nerve root and into the epidural space.  Procedure Details: After squaring off the end-plates to get a true AP view, the C-arm was positioned so that an oblique view of the foramen as noted above was visualized. The target area is just inferior to the nose of the scotty dog or sub pedicular. The soft tissues overlying this structure were infiltrated with 2-3 ml. of 1% Lidocaine  without Epinephrine.  The spinal needle was inserted toward the target using a trajectory view along the fluoroscope beam.  Under AP and lateral visualization, the needle was advanced so it did not puncture dura and was located close the 6 O'Clock position of the pedical in AP tracterory. Biplanar projections were used to confirm position. Aspiration was confirmed to be negative for CSF and/or blood. A 1-2 ml. volume of Isovue-250 was injected and flow of contrast was noted at each level. Radiographs were obtained for documentation purposes.   After attaining the desired flow of contrast documented above, a 0.5 to 1.0 ml test dose of 0.25% Marcaine  was injected into each respective transforaminal space.  The patient was observed for 90 seconds post injection.  After no sensory deficits were reported, and normal lower extremity motor function was noted,   the above injectate  was administered so that equal amounts of the injectate were placed at each foramen (level) into the transforaminal epidural space.   Additional Comments:  The patient tolerated the procedure well Dressing: 2 x 2 sterile gauze and Band-Aid    Post-procedure details: Patient was observed during the  procedure. Post-procedure instructions were reviewed.  Patient left the clinic in stable condition.    Clinical History: CLINICAL DATA:  Left radicular leg pain   EXAM: MRI LUMBAR SPINE WITHOUT CONTRAST   TECHNIQUE: Multiplanar, multisequence MR imaging of the lumbar spine was performed. No intravenous contrast was administered.   COMPARISON:  11/28/2018   FINDINGS: Segmentation:  Standard.   Alignment:  Slight degenerative anterolisthesis at L4-5.   Vertebrae:  No fracture, evidence of discitis, or bone lesion.   Conus medullaris and cauda equina: Conus extends to the L2 level. Conus and cauda equina appear normal.   Paraspinal and other soft tissues: Negative for perispinal mass or inflammation.   Disc levels:   L2-L3: Ventral spondylitic spurring.   L4-L5: Degenerative facet spurring with mild anterolisthesis. Spurring is greater on the left. Circumferential disc bulging. The canal and foramina are patent   L5-S1:Degenerative facet spurring. Mild disc bulging. Tiny central protrusion.   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 No convincing change from 2020 and no neural impingement.     Electronically Signed   By: Dorn Roulette M.D.   On: 07/28/2022 20:19     Objective:  VS:  HT:    WT:   BMI:     BP:(!) 149/90  HR:79bpm  TEMP: ( )  RESP:  Physical Exam Vitals and nursing note reviewed.  Constitutional:      General: She is not in acute distress.    Appearance: Normal appearance. She is not ill-appearing.  HENT:     Head: Normocephalic and atraumatic.     Right Ear: External ear normal.     Left Ear: External ear normal.  Eyes:     Extraocular Movements: Extraocular movements intact.  Cardiovascular:     Rate and Rhythm: Normal rate.     Pulses: Normal pulses.  Pulmonary:     Effort: Pulmonary effort is normal. No respiratory distress.  Abdominal:     General: There is no distension.     Palpations:  Abdomen is soft.  Musculoskeletal:        General: Tenderness present.     Cervical back: Neck supple.     Right lower leg: No edema.     Left lower leg: No edema.     Comments: Patient has good distal strength with no pain over the greater trochanters.  No clonus or focal weakness.  Skin:    Findings: No erythema, lesion or rash.  Neurological:     General: No focal deficit present.     Mental Status: She is alert and oriented to person, place, and time.     Sensory: No sensory deficit.     Motor: No weakness or abnormal muscle tone.     Coordination: Coordination normal.  Psychiatric:        Mood and Affect: Mood normal.        Behavior: Behavior normal.      Imaging: No results found.

## 2024-01-08 NOTE — Procedures (Signed)
 Lumbosacral Transforaminal Epidural Steroid Injection - Sub-Pedicular Approach with Fluoroscopic Guidance  Patient: Carol Maldonado      Date of Birth: [birth year] MRN: [id number] PCP: Tobie Suzzane POUR, MD      Visit Date: 12/28/2023   Universal Protocol:    Date/Time: 12/28/2023  Consent Given By: the patient  Position: PRONE  Additional Comments: Vital signs were monitored before and after the procedure. Patient was prepped and draped in the usual sterile fashion. The correct patient, procedure, and site was verified.   Injection Procedure Details:   Procedure diagnoses: Lumbar radiculopathy [M54.16]    Meds Administered:  Meds ordered this encounter  Medications   methylPREDNISolone acetate (DEPO-MEDROL) injection 40 mg    Laterality: Left  Location/Site: L4  Needle:5.0 in., 22 ga.  Short bevel or Quincke spinal needle  Needle Placement: Transforaminal  Findings:    -Comments: Excellent flow of contrast along the nerve, nerve root and into the epidural space.  Procedure Details: After squaring off the end-plates to get a true AP view, the C-arm was positioned so that an oblique view of the foramen as noted above was visualized. The target area is just inferior to the nose of the scotty dog or sub pedicular. The soft tissues overlying this structure were infiltrated with 2-3 ml. of 1% Lidocaine  without Epinephrine.  The spinal needle was inserted toward the target using a trajectory view along the fluoroscope beam.  Under AP and lateral visualization, the needle was advanced so it did not puncture dura and was located close the 6 O'Clock position of the pedical in AP tracterory. Biplanar projections were used to confirm position. Aspiration was confirmed to be negative for CSF and/or blood. A 1-2 ml. volume of Isovue-250 was injected and flow of contrast was noted at each level. Radiographs were obtained for documentation purposes.   After attaining the desired flow  of contrast documented above, a 0.5 to 1.0 ml test dose of 0.25% Marcaine  was injected into each respective transforaminal space.  The patient was observed for 90 seconds post injection.  After no sensory deficits were reported, and normal lower extremity motor function was noted,   the above injectate was administered so that equal amounts of the injectate were placed at each foramen (level) into the transforaminal epidural space.   Additional Comments:  The patient tolerated the procedure well Dressing: 2 x 2 sterile gauze and Band-Aid    Post-procedure details: Patient was observed during the procedure. Post-procedure instructions were reviewed.  Patient left the clinic in stable condition.

## 2024-01-20 ENCOUNTER — Other Ambulatory Visit: Payer: Self-pay | Admitting: Internal Medicine

## 2024-01-20 DIAGNOSIS — J4520 Mild intermittent asthma, uncomplicated: Secondary | ICD-10-CM

## 2024-01-21 ENCOUNTER — Other Ambulatory Visit: Payer: Self-pay | Admitting: Internal Medicine

## 2024-01-21 DIAGNOSIS — F411 Generalized anxiety disorder: Secondary | ICD-10-CM

## 2024-01-30 ENCOUNTER — Encounter: Payer: Self-pay | Admitting: Radiology

## 2024-02-13 ENCOUNTER — Encounter: Payer: Self-pay | Admitting: Internal Medicine

## 2024-02-19 ENCOUNTER — Other Ambulatory Visit: Payer: Self-pay | Admitting: Internal Medicine

## 2024-02-19 DIAGNOSIS — J302 Other seasonal allergic rhinitis: Secondary | ICD-10-CM

## 2024-02-19 DIAGNOSIS — J4520 Mild intermittent asthma, uncomplicated: Secondary | ICD-10-CM

## 2024-03-20 ENCOUNTER — Other Ambulatory Visit: Payer: Self-pay | Admitting: Internal Medicine

## 2024-03-20 DIAGNOSIS — K219 Gastro-esophageal reflux disease without esophagitis: Secondary | ICD-10-CM

## 2024-03-20 DIAGNOSIS — J4520 Mild intermittent asthma, uncomplicated: Secondary | ICD-10-CM

## 2024-03-28 ENCOUNTER — Ambulatory Visit: Admitting: Internal Medicine

## 2024-03-29 ENCOUNTER — Other Ambulatory Visit: Payer: Self-pay | Admitting: Medical Genetics

## 2024-04-03 ENCOUNTER — Ambulatory Visit: Admitting: Internal Medicine

## 2024-04-03 ENCOUNTER — Encounter: Payer: Self-pay | Admitting: Internal Medicine

## 2024-04-03 VITALS — BP 128/74 | HR 115 | Ht 61.0 in | Wt 156.8 lb

## 2024-04-03 DIAGNOSIS — E1165 Type 2 diabetes mellitus with hyperglycemia: Secondary | ICD-10-CM

## 2024-04-03 DIAGNOSIS — N1832 Chronic kidney disease, stage 3b: Secondary | ICD-10-CM | POA: Diagnosis not present

## 2024-04-03 DIAGNOSIS — F411 Generalized anxiety disorder: Secondary | ICD-10-CM

## 2024-04-03 DIAGNOSIS — E782 Mixed hyperlipidemia: Secondary | ICD-10-CM | POA: Diagnosis not present

## 2024-04-03 DIAGNOSIS — F3341 Major depressive disorder, recurrent, in partial remission: Secondary | ICD-10-CM | POA: Diagnosis not present

## 2024-04-03 DIAGNOSIS — I10 Essential (primary) hypertension: Secondary | ICD-10-CM | POA: Diagnosis not present

## 2024-04-03 MED ORDER — TIRZEPATIDE 5 MG/0.5ML ~~LOC~~ SOAJ: 5.0000 mg | SUBCUTANEOUS | 3 refills | Status: DC

## 2024-04-03 MED ORDER — TIRZEPATIDE 5 MG/0.5ML ~~LOC~~ SOAJ: 5.0000 mg | SUBCUTANEOUS | 3 refills | Status: AC

## 2024-04-03 NOTE — Progress Notes (Signed)
 "  Established Patient Office Visit  Subjective:  Patient ID: Carol Maldonado, female    DOB: June 01, 1960  Age: 64 y.o. MRN: [id number]  CC:  Chief Complaint  Patient presents with   Follow-up    Follow up     HPI Carol Maldonado is a 64 y.o. female with past medical history of HTN, GERD, osteoporosis, CKD stage IIIb, depression with insomnia and HLD who presents for annual physical.  HTN: BP is WNL today. Takes medications regularly. Patient denies headache, dizziness, chest pain, dyspnea or palpitations.  CKD stage 3b: BMP reviewed from chart, GFR at 34, worse compared to prior.  Carol Maldonado has h/o hyperkalemia, potassium at 5.7 in 09/25.  Carol Maldonado had nephrology visit after it. Carol Maldonado has cut down potassium rich food. Carol Maldonado denies any dysuria, hematuria, urinary hesitance or resistance.  Carol Maldonado is seeing Nephrology.  Carol Maldonado is on ARB and vitamin D  supplements currently. Her Hb was 10.0, overall stable compared to prior. Carol Maldonado denies any signs of bleeding, such as melena, hematochezia or hematuria.  Type II DM: Her HbA1c was 7.1 in 09/25, increased from 5.9 in 03/25.  Carol Maldonado has improved with diet since then.  Carol Maldonado has lost about 17 lbs since the last visit. Carol Maldonado agrees to continue to follow low-carb diet.  Denies dysuria, hematuria, polyuria currently.  Stress fracture of femur: Carol Maldonado had stress fracture of femur, s/p left femur intramedullary nail placement on 08/10/22.  Carol Maldonado was told that it was due to Fosamax  use (2020-2024). Carol Maldonado has also stopped OTC calcium  and vitamin D  supplement.  Carol Maldonado takes Rayaldee  for vitamin D  deficiency and CKD. Carol Maldonado still has left hip pain, which is slowly improving.  Carol Maldonado has gabapentin  for neuropathic pain.  Carol Maldonado prefers to take Tylenol  instead for hip pain.  Carol Maldonado takes Rabeprazole  for GERD. Denies any dysphagia or odynophagia.  Carol Maldonado has been taking Zoloft  for depression and takes Xanax  at bedtime for insomnia.  Denies any anhedonia, SI or HI.  Past Medical History:  Diagnosis Date   Allergy     Phreesia 02/29/2020   Anxiety    Phreesia 02/29/2020   Hyperlipidemia    Phreesia 02/29/2020   Hypertension    Phreesia 02/29/2020   Insomnia    Osteoporosis    Phreesia 02/29/2020   Seasonal allergies     Past Surgical History:  Procedure Laterality Date   COLONOSCOPY  03/27/2011   Dr. Shaaron; entirely normal exam and recommended repeat in 10 years.   COLONOSCOPY WITH PROPOFOL  N/A 08/28/2020   Procedure: COLONOSCOPY WITH PROPOFOL ;  Surgeon: Shaaron Lamar HERO, MD;  Location: [facility];  Service: Endoscopy;  Laterality: N/A;  8:30am   FEMUR IM NAIL Left 08/10/2022   Procedure: LEFT FEMUR INTRAMEDULLARY (IM) NAIL;  Surgeon: Addie Cordella Hamilton, MD;  Location: [facility] OR;  Service: Orthopedics;  Laterality: Left;    Family History  Problem Relation Age of Onset   Hypertension Mother    Heart attack Mother    Pneumonia Maternal Grandmother    Other Maternal Grandfather        MVA   Hypertension Father    High Cholesterol Father    Irritable bowel syndrome Sister    Irritable bowel syndrome Sister    Colon cancer Neg Hx     Social History   Socioeconomic History   Marital status: Single    Spouse name: Not on file   Number of children: Not on file   Years of education: Not on file   Highest  education level: Associate degree: academic program  Occupational History   Not on file  Tobacco Use   Smoking status: Never   Smokeless tobacco: Never  Vaping Use   Vaping status: Never Used  Substance and Sexual Activity   Alcohol use: Never    Alcohol/week: 0.0 standard drinks of alcohol   Drug use: Never   Sexual activity: Not Currently    Birth control/protection: Post-menopausal  Other Topics Concern   Not on file  Social History Narrative   Not on file   Social Drivers of Health   Tobacco Use: Low Risk (04/03/2024)   Patient History    Smoking Tobacco Use: Never    Smokeless Tobacco Use: Never    Passive Exposure: Not on file  Financial Resource Strain: Low Risk  (04/02/2024)   Overall Financial Resource Strain (CARDIA)    Difficulty of Paying Living Expenses: Not hard at all  Food Insecurity: No Food Insecurity (04/02/2024)   Epic    Worried About Radiation Protection Practitioner of Food in the Last Year: Never true    Ran Out of Food in the Last Year: Never true  Transportation Needs: No Transportation Needs (04/02/2024)   Epic    Lack of Transportation (Medical): No    Lack of Transportation (Non-Medical): No  Physical Activity: Insufficiently Active (04/02/2024)   Exercise Vital Sign    Days of Exercise per Week: 3 days    Minutes of Exercise per Session: 20 min  Stress: No Stress Concern Present (04/02/2024)   [employer] of Occupational Health - Occupational Stress Questionnaire    Feeling of Stress: Not at all  Social Connections: Moderately Integrated (04/02/2024)   Social Connection and Isolation Panel    Frequency of Communication with Friends and Family: More than three times a week    Frequency of Social Gatherings with Friends and Family: More than three times a week    Attends Religious Services: More than 4 times per year    Active Member of [employer] or Organizations: Yes    Attends Banker Meetings: More than 4 times per year    Marital Status: Never married  Intimate Partner Violence: Not At Risk (03/04/2022)   Humiliation, Afraid, Rape, and Kick questionnaire    Fear of Current or Ex-Partner: No    Emotionally Abused: No    Physically Abused: No    Sexually Abused: No  Depression (PHQ2-9): Low Risk (12/22/2023)   Depression (PHQ2-9)    PHQ-2 Score: 0  Alcohol Screen: Low Risk (03/04/2022)   Alcohol Screen    Last Alcohol Screening Score (AUDIT): 0  Housing: Low Risk (04/02/2024)   Epic    Unable to Pay for Housing in the Last Year: No    Number of Times Moved in the Last Year: 0    Homeless in the Last Year: No  Utilities: Not At Risk (03/04/2022)   [facility] Utilities    Threatened with loss of utilities: No  Health Literacy: Not on file     Outpatient Medications Prior to Visit  Medication Sig Dispense Refill   albuterol  (VENTOLIN  HFA) 108 (90 Base) MCG/ACT inhaler inhale 2 puffs into the lungs every 6 (six) hours as needed. 6.7 g 0   ALPRAZolam  (XANAX ) 0.5 MG tablet take 1 tablet by mouth at bedtime. 30 tablet 3   amlodipine -olmesartan  (AZOR ) 10-20 MG tablet Take 1 tablet by mouth daily. 90 tablet 1   azelastine  (ASTELIN ) 0.1 % nasal spray place 1 spray into both nostrils 2 (two)  times daily. use in each nostril as directed 30 mL 2   Blood Glucose Monitoring Suppl DEVI 1 each by Does not apply route in the morning, at noon, and at bedtime. May substitute to any manufacturer covered by patient's insurance. 1 each 0   desloratadine  (CLARINEX ) 5 MG tablet Take 1 tablet (5 mg total) by mouth daily. 30 tablet 11   flunisolide  (NASALIDE ) 25 MCG/ACT (0.025%) SOLN place 2 sprays into the nose 2 (two) times daily. 25 mL 2   gabapentin  (NEURONTIN ) 300 MG capsule take 1 capsule (300 milligram total) by mouth 3 (three) times daily as needed. 90 capsule 3   Glucose Blood (BLOOD GLUCOSE TEST STRIPS) STRP 1 each by Does not apply route in the morning, at noon, and at bedtime. May substitute to any manufacturer covered by patient's insurance. 100 strip 2   HYDROmorphone  (DILAUDID ) 2 MG tablet Take 0.5 tablets (1 mg total) by mouth every 8 (eight) hours as needed for severe pain. 15 tablet 0   Naphazoline-Pheniramine (OPCON-A) 0.027-0.315 % SOLN Place 1 drop into both eyes 3 (three) times daily as needed (allergy eyes.).     Omega-3 Fatty Acids (FISH OIL) 1000 MG CAPS Take 1,000 mg by mouth in the morning.     RABEprazole  (ACIPHEX ) 20 MG tablet take 1 tablet (20 milligram total) by mouth daily. 90 tablet 0   RAYALDEE  30 MCG CPCR TAKE 1 CAPS BY MOUTH 2 TIMES A WEEK. TAKE 1 CAPSULE BY MOUTH ON MONDAYS & FRIDAYS IN THE EVENING. 8 capsule 5   rosuvastatin  (CRESTOR ) 20 MG tablet Take 1 tablet (20 mg total) by mouth daily. 90 tablet 1   sertraline   (ZOLOFT ) 50 MG tablet Take 1 tablet (50 mg total) by mouth daily. 90 tablet 1   tirzepatide  (MOUNJARO ) 2.5 MG/0.5ML Pen Inject 2.5 mg into the skin once a week. 2 mL 2   No facility-administered medications prior to visit.    Allergies  Allergen Reactions   Amoxicillin Hives, Itching and Rash   Codeine Hives, Itching and Rash   Hydrocodone Rash   Keflex [Cephalexin] Rash   Penicillins Rash   Sulfa Antibiotics Rash    ROS Review of Systems  Constitutional:  Negative for chills and fever.  HENT:  Negative for congestion, sinus pressure, sinus pain and sore throat.   Eyes:  Negative for pain and discharge.  Respiratory:  Negative for cough and shortness of breath.   Cardiovascular:  Negative for chest pain and palpitations.  Gastrointestinal:  Negative for abdominal pain, diarrhea, nausea and vomiting.  Endocrine: Negative for polydipsia and polyuria.  Genitourinary:  Negative for dysuria and hematuria.  Musculoskeletal:  Positive for arthralgias (Left hip). Negative for neck pain and neck stiffness.  Skin:  Negative for rash.  Neurological:  Negative for dizziness and weakness.  Psychiatric/Behavioral:  Positive for sleep disturbance. Negative for agitation and behavioral problems. The patient is nervous/anxious.       Objective:    Physical Exam Vitals reviewed.  Constitutional:      General: Carol Maldonado is not in acute distress.    Appearance: Carol Maldonado is not diaphoretic.     Comments: Has a cane for walking support  HENT:     Head: Normocephalic and atraumatic.     Nose: Nose normal. No congestion.     Mouth/Throat:     Mouth: Mucous membranes are moist.     Pharynx: No posterior oropharyngeal erythema.  Eyes:     General: No scleral icterus.  Extraocular Movements: Extraocular movements intact.  Cardiovascular:     Rate and Rhythm: Normal rate and regular rhythm.     Heart sounds: Normal heart sounds. No murmur heard. Pulmonary:     Breath sounds: Normal breath sounds. No  wheezing or rales.  Musculoskeletal:     Cervical back: Neck supple. No tenderness.     Right hip: Decreased range of motion.     Left hip: Tenderness present.     Left knee: Normal range of motion. No tenderness.     Right lower leg: No edema.     Left lower leg: No edema.  Skin:    General: Skin is warm.     Findings: No rash.  Neurological:     General: No focal deficit present.     Mental Status: Carol Maldonado is alert and oriented to person, place, and time.     Sensory: No sensory deficit.     Motor: No weakness.  Psychiatric:        Mood and Affect: Mood normal.        Behavior: Behavior normal.     BP 128/74   Pulse (!) 115   Ht 5' 1 (1.549 m)   Wt 156 lb 12.8 oz (71.1 kg)   SpO2 99%   BMI 29.63 kg/m  Wt Readings from Last 3 Encounters:  04/03/24 156 lb 12.8 oz (71.1 kg)  12/22/23 173 lb (78.5 kg)  06/22/23 165 lb (74.8 kg)    Lab Results  Component Value Date   TSH 1.620 12/15/2023   Lab Results  Component Value Date   WBC 9.0 04/03/2024   HGB 9.9 (L) 04/03/2024   HCT 32.0 (L) 04/03/2024   MCV 87 04/03/2024   PLT 250 04/03/2024   Lab Results  Component Value Date   NA 139 04/03/2024   K 4.9 04/03/2024   CO2 18 (L) 04/03/2024   GLUCOSE 97 04/03/2024   BUN 24 04/03/2024   CREATININE 1.88 (H) 04/03/2024   BILITOT 0.2 04/03/2024   ALKPHOS 126 04/03/2024   AST 14 04/03/2024   ALT 7 04/03/2024   PROT 7.9 04/03/2024   ALBUMIN 4.8 04/03/2024   CALCIUM  10.0 04/03/2024   ANIONGAP 8 08/10/2022   EGFR 29 (L) 04/03/2024   Lab Results  Component Value Date   CHOL 114 12/15/2023   Lab Results  Component Value Date   HDL 51 12/15/2023   Lab Results  Component Value Date   LDLCALC 44 12/15/2023   Lab Results  Component Value Date   TRIG 99 12/15/2023   Lab Results  Component Value Date   CHOLHDL 2.2 12/15/2023   Lab Results  Component Value Date   HGBA1C 5.9 (H) 04/03/2024      Assessment & Plan:   Problem List Items Addressed This Visit        Cardiovascular and Mediastinum   Essential hypertension, benign   BP Readings from Last 1 Encounters:  04/03/24 128/74   Usually well-controlled with Amlodipine -Olmesartan  10-20 mg QD now Advised to check BP at home and bring the log in the next visit Counseled for compliance with the medications Advised DASH diet and moderate exercise/walking, at least 150 mins/week        Endocrine   Type 2 diabetes mellitus with hyperglycemia, without long-term current use of insulin (HCC) - Primary   Lab Results  Component Value Date   HGBA1C 7.1 (H) 12/15/2023   Better controlled now Unable to take metformin due to CKD Increased dose  of Mounjaro  5 mg qw in the last visit Advised to follow diabetic diet On statin and ARB F/u CMP and lipid panel Diabetic eye exam: Advised to follow up with Ophthalmology for diabetic eye exam      Relevant Medications   tirzepatide  (MOUNJARO ) 5 MG/0.5ML Pen   Other Relevant Orders   CMP14+EGFR (Completed)   Hemoglobin A1c (Completed)   Urine Microalbumin w/creat. ratio (Completed)     Genitourinary   Stage 3b chronic kidney disease (HCC)   Last BMP reviewed, progressive CKD - stays around 30 Advised to maintain at least 64 ounces of fluid intake in a day Avoid nephrotoxic agents On ARB Followed by nephrology      Relevant Orders   Hemoglobin A1c (Completed)   CBC with Differential/Platelet (Completed)   Urine Microalbumin w/creat. ratio (Completed)     Other   HLD (hyperlipidemia)   On statin Lipid profile reviewed      MDD (major depressive disorder), recurrent, in partial remission   Well controlled with Zoloft  50 mg QD      GAD (generalized anxiety disorder)   Well-controlled with Zoloft  and as needed Xanax  PDMP reviewed         Meds ordered this encounter  Medications   DISCONTD: tirzepatide  (MOUNJARO ) 5 MG/0.5ML Pen    Sig: Inject 5 mg into the skin once a week.    Dispense:  2 mL    Refill:  3   tirzepatide   (MOUNJARO ) 5 MG/0.5ML Pen    Sig: Inject 5 mg into the skin once a week.    Dispense:  2 mL    Refill:  3    Follow-up: Return in about 4 months (around 08/01/2024) for AWV.    Suzzane MARLA Blanch, MD "

## 2024-04-03 NOTE — Assessment & Plan Note (Addendum)
 Lab Results  Component Value Date   HGBA1C 7.1 (H) 12/15/2023   Better controlled now Unable to take metformin due to CKD Increased dose of Mounjaro  5 mg qw in the last visit Advised to follow diabetic diet On statin and ARB F/u CMP and lipid panel Diabetic eye exam: Advised to follow up with Ophthalmology for diabetic eye exam

## 2024-04-03 NOTE — Patient Instructions (Signed)
 Please start taking Mounjaro  5 mg once weekly. Please let me know if Ozempic is a preferred medicine with insurance.  Please continue to take medications as prescribed.  Please continue to follow low carb diet and perform moderate exercise/walking at least 150 mins/week.

## 2024-04-04 ENCOUNTER — Ambulatory Visit: Payer: Self-pay | Admitting: Internal Medicine

## 2024-04-04 LAB — CBC WITH DIFFERENTIAL/PLATELET
Basophils Absolute: 0 x10E3/uL (ref 0.0–0.2)
Basos: 0 %
EOS (ABSOLUTE): 0.1 x10E3/uL (ref 0.0–0.4)
Eos: 1 %
Hematocrit: 32 % — ABNORMAL LOW (ref 34.0–46.6)
Hemoglobin: 9.9 g/dL — ABNORMAL LOW (ref 11.1–15.9)
Immature Grans (Abs): 0 x10E3/uL (ref 0.0–0.1)
Immature Granulocytes: 0 %
Lymphocytes Absolute: 1.3 x10E3/uL (ref 0.7–3.1)
Lymphs: 14 %
MCH: 26.9 pg (ref 26.6–33.0)
MCHC: 30.9 g/dL — ABNORMAL LOW (ref 31.5–35.7)
MCV: 87 fL (ref 79–97)
Monocytes Absolute: 0.5 x10E3/uL (ref 0.1–0.9)
Monocytes: 6 %
Neutrophils Absolute: 7.1 x10E3/uL — ABNORMAL HIGH (ref 1.4–7.0)
Neutrophils: 79 %
Platelets: 250 x10E3/uL (ref 150–450)
RBC: 3.68 x10E6/uL — ABNORMAL LOW (ref 3.77–5.28)
RDW: 13.4 % (ref 11.7–15.4)
WBC: 9 x10E3/uL (ref 3.4–10.8)

## 2024-04-04 LAB — CMP14+EGFR
ALT: 7 IU/L (ref 0–32)
AST: 14 IU/L (ref 0–40)
Albumin: 4.8 g/dL (ref 3.9–4.9)
Alkaline Phosphatase: 126 IU/L (ref 49–135)
BUN/Creatinine Ratio: 13 (ref 12–28)
BUN: 24 mg/dL (ref 8–27)
Bilirubin Total: 0.2 mg/dL (ref 0.0–1.2)
CO2: 18 mmol/L — ABNORMAL LOW (ref 20–29)
Calcium: 10 mg/dL (ref 8.7–10.3)
Chloride: 106 mmol/L (ref 96–106)
Creatinine, Ser: 1.88 mg/dL — ABNORMAL HIGH (ref 0.57–1.00)
Globulin, Total: 3.1 g/dL (ref 1.5–4.5)
Glucose: 97 mg/dL (ref 70–99)
Potassium: 4.9 mmol/L (ref 3.5–5.2)
Sodium: 139 mmol/L (ref 134–144)
Total Protein: 7.9 g/dL (ref 6.0–8.5)
eGFR: 29 mL/min/1.73 — ABNORMAL LOW

## 2024-04-04 LAB — HEMOGLOBIN A1C
Est. average glucose Bld gHb Est-mCnc: 123 mg/dL
Hgb A1c MFr Bld: 5.9 % — ABNORMAL HIGH (ref 4.8–5.6)

## 2024-04-05 LAB — MICROALBUMIN / CREATININE URINE RATIO
Creatinine, Urine: 264.7 mg/dL
Microalb/Creat Ratio: 139 mg/g{creat} — ABNORMAL HIGH (ref 0–29)
Microalbumin, Urine: 368.2 ug/mL

## 2024-04-07 NOTE — Assessment & Plan Note (Signed)
 Well-controlled with Zoloft 50 mg QD

## 2024-04-07 NOTE — Assessment & Plan Note (Signed)
 BP Readings from Last 1 Encounters:  04/03/24 128/74   Usually well-controlled with Amlodipine -Olmesartan  10-20 mg QD now Advised to check BP at home and bring the log in the next visit Counseled for compliance with the medications Advised DASH diet and moderate exercise/walking, at least 150 mins/week

## 2024-04-07 NOTE — Assessment & Plan Note (Signed)
On statin Lipid profile reviewed 

## 2024-04-07 NOTE — Assessment & Plan Note (Signed)
 Last BMP reviewed, progressive CKD - stays around 30 Advised to maintain at least 64 ounces of fluid intake in a day Avoid nephrotoxic agents On ARB Followed by nephrology

## 2024-04-07 NOTE — Assessment & Plan Note (Addendum)
 Well-controlled with Zoloft  and as needed Xanax  PDMP reviewed

## 2024-04-17 ENCOUNTER — Other Ambulatory Visit: Payer: Self-pay | Admitting: Internal Medicine

## 2024-04-17 DIAGNOSIS — J4520 Mild intermittent asthma, uncomplicated: Secondary | ICD-10-CM

## 2024-04-24 ENCOUNTER — Other Ambulatory Visit: Payer: Self-pay | Admitting: Surgical

## 2024-08-01 ENCOUNTER — Encounter: Payer: Self-pay | Admitting: Internal Medicine
